# Patient Record
Sex: Female | Born: 1970 | Race: White | Hispanic: No | Smoking: Never smoker
Health system: Southern US, Community
[De-identification: ages and names within clinical notes are randomized; demographics above are authoritative.]

## PROBLEM LIST (undated history)

## (undated) DIAGNOSIS — F419 Anxiety disorder, unspecified: Secondary | ICD-10-CM

## (undated) DIAGNOSIS — T4145XA Adverse effect of unspecified anesthetic, initial encounter: Secondary | ICD-10-CM

## (undated) DIAGNOSIS — Z9889 Other specified postprocedural states: Secondary | ICD-10-CM

## (undated) DIAGNOSIS — R519 Headache, unspecified: Secondary | ICD-10-CM

## (undated) DIAGNOSIS — R112 Nausea with vomiting, unspecified: Secondary | ICD-10-CM

## (undated) DIAGNOSIS — T8859XA Other complications of anesthesia, initial encounter: Secondary | ICD-10-CM

## (undated) DIAGNOSIS — K219 Gastro-esophageal reflux disease without esophagitis: Secondary | ICD-10-CM

## (undated) DIAGNOSIS — M1990 Unspecified osteoarthritis, unspecified site: Secondary | ICD-10-CM

## (undated) DIAGNOSIS — R51 Headache: Secondary | ICD-10-CM

## (undated) DIAGNOSIS — R8290 Unspecified abnormal findings in urine: Secondary | ICD-10-CM

## (undated) HISTORY — DX: Anxiety disorder, unspecified: F41.9

## (undated) HISTORY — PX: ABDOMINAL HYSTERECTOMY: SHX81

## (undated) HISTORY — DX: Unspecified abnormal findings in urine: R82.90

## (undated) HISTORY — PX: BLADDER SURGERY: SHX569

## (undated) HISTORY — PX: CLOSED REDUCTION FINGER WITH PERCUTANEOUS PINNING: SHX5612

---

## 2000-12-03 ENCOUNTER — Encounter: Payer: Self-pay | Admitting: Family Medicine

## 2000-12-03 ENCOUNTER — Ambulatory Visit (HOSPITAL_COMMUNITY): Admission: RE | Admit: 2000-12-03 | Discharge: 2000-12-03 | Payer: Self-pay | Admitting: Family Medicine

## 2001-06-25 ENCOUNTER — Other Ambulatory Visit: Admission: RE | Admit: 2001-06-25 | Discharge: 2001-06-25 | Payer: Self-pay | Admitting: Obstetrics and Gynecology

## 2002-03-04 ENCOUNTER — Inpatient Hospital Stay (HOSPITAL_COMMUNITY): Admission: RE | Admit: 2002-03-04 | Discharge: 2002-03-06 | Payer: Self-pay | Admitting: Obstetrics & Gynecology

## 2002-05-22 ENCOUNTER — Ambulatory Visit (HOSPITAL_COMMUNITY): Admission: RE | Admit: 2002-05-22 | Discharge: 2002-05-22 | Payer: Self-pay | Admitting: Obstetrics & Gynecology

## 2003-01-04 ENCOUNTER — Emergency Department (HOSPITAL_COMMUNITY): Admission: EM | Admit: 2003-01-04 | Discharge: 2003-01-04 | Payer: Self-pay | Admitting: *Deleted

## 2007-06-18 ENCOUNTER — Ambulatory Visit (HOSPITAL_BASED_OUTPATIENT_CLINIC_OR_DEPARTMENT_OTHER): Admission: RE | Admit: 2007-06-18 | Discharge: 2007-06-18 | Payer: Self-pay | Admitting: Urology

## 2007-07-30 ENCOUNTER — Ambulatory Visit (HOSPITAL_BASED_OUTPATIENT_CLINIC_OR_DEPARTMENT_OTHER): Admission: RE | Admit: 2007-07-30 | Discharge: 2007-07-30 | Payer: Self-pay | Admitting: Urology

## 2007-10-31 ENCOUNTER — Emergency Department (HOSPITAL_COMMUNITY): Admission: EM | Admit: 2007-10-31 | Discharge: 2007-11-01 | Payer: Self-pay | Admitting: Emergency Medicine

## 2007-11-27 ENCOUNTER — Encounter: Admission: RE | Admit: 2007-11-27 | Discharge: 2007-11-27 | Payer: Self-pay | Admitting: General Surgery

## 2010-01-20 ENCOUNTER — Ambulatory Visit (HOSPITAL_COMMUNITY): Admission: RE | Admit: 2010-01-20 | Discharge: 2010-01-20 | Payer: Self-pay | Admitting: Obstetrics & Gynecology

## 2010-09-13 NOTE — Op Note (Signed)
Julie [name], Julie [name]             ACCOUNT NO.:  [id number]   MEDICAL RECORD NO.:  [id number]          PATIENT TYPE:  AMB   LOCATION:  [facility]                         FACILITY:  [facility]   PHYSICIAN:  Martina Sinner, MD DATE OF BIRTH:  [birth year]   DATE OF PROCEDURE:  06/18/2007  DATE OF DISCHARGE:                               OPERATIVE REPORT   PREOPERATIVE DIAGNOSIS:  Stress incontinence.   POSTOPERATIVE DIAGNOSIS:  Stress incontinence.   SURGERY:  Sling cystourethropexy [facility]) plus cystoscopy.   SURGEON:  Martina Sinner, MD   Ms. [name] has residual stress incontinence on Vesicare for her mixed  stress urge incontinence.  She consented to a sling.  She was taught  intermittent catheterization prior to surgery.   The patient was prepped and draped in usual fashion.  Extra care was  taken with leg positioning to minimize the risk of compartment syndrome  neuropathy and DVT.   I made two 1 cm incisions 1.5 cm lateral to the midline one  fingerbreadth just above the symphysis pubis.  I made a 2 cm incision  underlying the midurethra.  I made a thick incision so there is a nice  thick pubocervical flap to cover the sling.  I sharply dissected to  urethrovesical angle.  I instilled 7 mL of lidocaine epinephrine mixture  prior to making incision.   With the bladder empty I passed a SPARC needle on top of and along the  back of the symphysis pubis parallel to the midline on the pulp of my  index finger bilaterally.   I then cystoscoped the patient.  There is efflux of indigo carmine  bilaterally.  The bladder was not emptied and entered.  There is no  deflection of the bladder when I wiggled the trocars.  Urethra was  normal.   With the bladder empty, I attached the [facility] sling and brought it up  through the retropubic space.  I tensioned over the fat part of mid  sized Kelly clamp.  I cut below the blue dot to irrigate the sheath and  removed the sheaths.   I was very  happy with the position of the sling in the mid urethra.  There was appropriate hypermobility.  There is no spring back effect.   Irrigation of all incisions was utilized.  I closed the vaginal wall  incision with running 2-0 Vicryl on a CT wand followed by two  interrupted sutures.   I cut the sling below the level of the skin and closed the skin with 4-0  subcuticular as well as Dermabond.   Catheter was draining well at end of the case blue urine.  Vaginal pack  was inserted.  The patient taken to recovery room.           ______________________________  Martina Sinner, MD  Electronically Signed     SAM/MEDQ  D:  06/18/2007  T:  06/19/2007  Job:  [id number]

## 2010-09-13 NOTE — Op Note (Signed)
[name], [name]             ACCOUNT NO.:  [id number]   MEDICAL RECORD NO.:  [id number]          PATIENT TYPE:  AMB   LOCATION:  [facility]                         FACILITY:  [facility]   PHYSICIAN:  Martina Sinner, MD DATE OF BIRTH:  Jun 04, 1970   DATE OF PROCEDURE:  07/30/2007  DATE OF DISCHARGE:                               OPERATIVE REPORT   PREOPERATIVE DIAGNOSES:  Obstructive voiding post sling.   POSTOPERATIVE DIAGNOSES:  Obstructive voiding post sling.   PROCEDURE:  Urethrolysis plus cystoscopy.   SURGEON:  Martina Sinner, MD.   ASSISTANT:  Julie [name].   Julie [name] is 5-6 weeks from a sling.  She was emptying well and  was dry but had a slow trickling flow affecting her quality of life that  was not improved with time and with physical therapy.   The patient was prepped and draped in the usual fashion.  Extra care was  taken to minimize the risk of compartment syndrome, neuropathy and DVT  when I positioned her legs.  She was given preoperative antibiotics and  her laboratory tests were within normal limits drawn a few weeks ago.   I initially cystoscoped the patient.  The bladder and urethra were  normal.  I felt I could feel the sling in the middle aspect of the  incision.   I placed an Allis clamp near the urethral meatus and made a new incision  approximately 2-1/2 to 3 cm in length extending back near to the bladder  neck.  I developed a vaginal wall mucosal flap bilaterally with  Metzenbaum scissors.  I used the double ring retractor for exposure.   On two occasions, I placed a 21-French scope with its sheath to give me  a nice back pour that I could feel the sling with the tip of my finger  and fingernail.   I sharply dissected down with a 15 blade in the midline and found the  sling in the mid urethra.  I placed a very small right angle around it.  I incised it in the midline releasing the middle aspect of the urethra.  I sharply dissected the  sling after placing a tonsil on its free end  approximately a centimeter bilaterally.  I did not dissect all the way  to the endopelvic fascia. I trimmed approximately a centimeter of sling  bilaterally.   Not surprising, there was backbleeding at 5 and 7 o'clock near the  urethrovesical angle that was oversewn with a 3-0 Vicryl suture.  The  incision was irrigated.  I then trimmed the vaginal wall mucosa  minimally and closed it with running 2-0 Vicryl on a CT2 needle followed  by two interrupted sutures.   The Foley catheter was draining well at the end of the case and a  vaginal pack was inserted.  Hopefully this operation will reach Ms.  Rondel Baton treatment goal in that she will avoid better but still maintain  her continence.           ______________________________  Martina Sinner, MD  Electronically Signed     SAM/MEDQ  D:  07/30/2007  T:  07/30/2007  Job:  [id number]

## 2010-09-16 NOTE — Op Note (Signed)
NAME:  Julie [name], Julie [name]                       ACCOUNT NO.:  [id number]   MEDICAL RECORD NO.:  [id number]                   PATIENT TYPE:  INP   LOCATION:  A319                                 FACILITY:  [facility]   PHYSICIAN:  Lazaro Arms, M.D.                DATE OF BIRTH:  1970/06/08   DATE OF PROCEDURE:  03/04/2002  DATE OF DISCHARGE:                                 OPERATIVE REPORT   PREOPERATIVE DIAGNOSES:  1. Hypermenorrhea.  2. Dyspareunia.  3. Dysmenorrhea.  4. Right lower quadrant pain.   POSTOPERATIVE DIAGNOSES:  1. Hypermenorrhea.  2. Dyspareunia.  3. Dysmenorrhea.  4. Right lower quadrant pain.   PROCEDURE:  TVH and RSO.   SURGEON:  Lazaro Arms, M.D.   ANESTHESIA:  General endotracheal.   FINDINGS:  The patient had what appeared to be a normal uterus, somewhat  boggy.  The right ovary was tucked to the pelvic sidewall, but it was  otherwise normal.  The left ovary and tube were normal and free.   DESCRIPTION OF OPERATION:  The patient was taken to the operating room,  placed in a supine position, underwent general endotracheal anesthesia and  placed in dorsal lithotomy position and prepped and draped in the usual  sterile fashion.  A Foley catheter was placed.  A weighted speculum was  placed, cervix was grasped with thyroid tenaculums.  Marcaine 0.5% with  1:200,000 epinephrine was injected circumferentially about the cervix to  create a plane and for blood loss.  Circumferential incision was made with  electrocautery unit, and the vagina pushed off the lower uterine segment  both anteriorly and posteriorly.  The posterior cul-de-sac was entered  sharply.  The uterosacral ligaments were clamped, cut, transfixed, and  suture ligated and held.  The cardinal ligaments were clamped, cut,  transfixed, and suture ligated and held.  The anterior cul-de-sac was  entered.  The anterior and posterior leaves of the broad ligament were  plicated, and the uterine  vessels were clamped, cut, and suture ligated,  serial pedicles taken up the fundus, each pedicle being clamped, cut,  transfixed, and suture ligated and cut.  The uteroovarian ligaments were  cross-clamped and double suture ligated bilaterally.  Hemoclips were placed  for back-clamping.  There was a small bleeder off the uterine vessels, and  this was clamped with several hemoclips as well, and good hemostasis was  achieved.  The right infundibulopelvic ligament was then cross-clamped;  hemoclips were placed cephalad as a back clamp.  Double suture ligation was  performed.  The ovary and tube was removed.  All pedicles were found to be  hemostatic.  The anterior vagina, anterior peritoneum, posterior peritoneum,  and posterior vagina were closed in an interrupted fashion, side-to-side,  and the uterosacral ligaments were plicated.  There was  good hemostasis.  The pelvis had been irrigated and, again. all pedicles  found to be hemostatic.  The patient experienced about 200 cc of blood loss.  She was awakened from anesthesia and taken to the recovery room in good,  stable condition.  All counts were correct.                                                Lazaro Arms, M.D.    [name]  D:  03/06/2002  T:  03/06/2002  Job:  [id number]

## 2010-09-16 NOTE — H&P (Signed)
NAME:  Julie [name], Julie [name]                       ACCOUNT NO.:  [id number]   MEDICAL RECORD NO.:  [id number]                   PATIENT TYPE:  AMB   LOCATION:  DAY                                  FACILITY:  [facility]   PHYSICIAN:  Lazaro Arms, M.D.                DATE OF BIRTH:  02/20/1971   DATE OF ADMISSION:  DATE OF DISCHARGE:                                HISTORY & PHYSICAL   ADMISSION HISTORY & PHYSICAL   HISTORY OF PRESENT ILLNESS:  The patient is a 41 year old white female  gravida 2, para 2, status post tubal ligation in 1999 who bleeds five to six  days during the moth.  She soils her clothes, occasionally her bed sheets.  She uses tampons and pads together.  Her cramps are horrendous; a heating  pad or laying hot water relieves them somewhat.  She also has right-sided  pain bad enough that she wonders if she has an appendix problem or an  ectopic.  She states she has dyspareunia only about 10% of the time; it is  in the lower abdomen, only in the midline and it causes her to change  positions.   Examination reveals a very tender boggy uterus, but normal size, right ovary  that is tender and left adnexa, which is normal size and nontender.  As a  result she is admitted for a TVH and RSO.   PAST MEDICAL HISTORY:  1. History of migraines.  2. Kidney infections as a child.  3. Frequent UTIs.   PAST SURGICAL HISTORY:  1. She has tubes in her ears as a child as well as a tonsillectomy.  2. She had a breast biopsy in 1998.  3. Tubal ligation in 1999.   PAST OBSTETRICAL HISTORY:  Two vaginal deliveries; 1993 and 1999.   REVIEW OF SYSTEMS:  Otherwise negative.   HABITS:  The patient does not smoke.  She completed the 10th grade.  She  drinks alcohol socially.  No substance abuse.   PHYSICAL EXAMINATION:   VITAL SIGNS:  Her weight is __________  pounds.  Blood pressure is 120/70.   HEENT:  Unremarkable with normal thyroid.   LUNGS:  Clear.   HEART:  Regular rate and  rhythm without murmur, regurg or gallop.   BREASTS:  Without mass, discharge or skin changes.   ABDOMEN:  Benign without hepatosplenomegaly or mass.   GENITALIA:  She has normal external genitalia.  Vagina is pink and moist  without discharge.  Cervix parous without lesions.  Uterus boggy and tender  to palpation.  Right adnexa is normal size, but tender to palpation.  Left  adnexa is negative.   EXTREMITIES:  Warm with no edema.   NEUROLOGIC EXAMINATION:  Grossly intact.   MEDICATIONS:  Zoloft for premenstrual dysphoric disorder.   IMPRESSION:  1. Hypermenorrhea.  2. Dyspareunia minimally.  3. Dysmenorrhea.  4. Right lower quadrant pain.   PLAN:  The patient is admitted for TVH/RSO.  She understands the risks,  benefits, indications and alternatives, and will proceed.                                                 Lazaro Arms, M.D.    [name]  D:  03/03/2002  T:  03/03/2002  Job:  [id number]

## 2010-09-16 NOTE — Op Note (Signed)
NAME:  KISSA, CAMPOY                       ACCOUNT NO.:  [id number]   MEDICAL RECORD NO.:  [id number]                   PATIENT TYPE:  AMB   LOCATION:  DAY                                  FACILITY:  [facility]   PHYSICIAN:  Lazaro Arms, M.D.                DATE OF BIRTH:  November 28, 1970   DATE OF PROCEDURE:  DATE OF DISCHARGE:                                 OPERATIVE REPORT   PREOPERATIVE DIAGNOSES:  1. Left lower quadrant pain.  2. Left bump dyspareunia.  3. Left ovary adherent to left side of vaginal cuff apex.   POSTOPERATIVE DIAGNOSES:  1. Left lower quadrant pain.  2. Left bump dyspareunia.  3. Left ovary adherent to left side of vaginal cuff apex.   PROCEDURE:  Laparoscopic left salpingo-oophorectomy with lysis of adhesions  to the vaginal cuff.   SURGEON:  Lazaro Arms, M.D.   ANESTHESIA:  General endotracheal   FINDINGS:  The patient's left ovary and tube and omentum were adherent to  the apex of the vaginal cuff.  The left tube was obviously at the left  corner and it was sort of matted down with omentum and some epiploica fat.  There was no evidence of any active infection, and the patient had had an  unremarkable postoperative course from her __________ in November.   DESCRIPTION OF OPERATION:  The patient was taken to the operating room and  placed in the supine position, where she underwent general endotracheal  anesthesia.  She was then placed in a low lithotomy position and prepped and  draped in the usual sterile fashion.  A Foley catheter was placed.  An  incision was made in the umbilicus and a Veress needle was placed into the  peritoneal cavity without difficulty.  The peritoneal cavity was  insufflated. A 10-11 nonbladed obturator was then placed under direct  visualization to the peritoneal cavity without difficulty.  The peritoneal  cavity was confirmed with the video laparoscope.   An incision was made 2 fingerbreadths above the pubis and in the  right lower  quadrant lateral to the inferior epigastric vessels and a 5-mm ablated  trocar was placed in the peritoneal cavity without difficulty in both of  these sites under direct visualization.  Blunt dissection was performed.  The left round ligament was isolated and the fallopian tube and ovary were  identified.  The harmonic scalpel was used and I cauterized across the  infundibulopelvic ligament and the fallopian tube where the round ligament  goes into the pelvic side wall well away from the ureter.  This was done  without difficulty.   The EndoCatch bag was placed and the specimen was removed and sent to  pathology.  It appeared to be normal.  The omentum remaining in the pelvis  was teased away from the vaginal cuff bluntly.  It was kind of filmy  adhesions for the most part with  some density in the midline.  The pelvis  was irrigated vigorously numerous times and Interceed was placed on top of  the vaginal cuff to prevent adhesions from reforming in this area.  The  patient tolerated the procedure well.  She experienced 50 cc of blood loss.   The instruments were removed.  The gas was allowed to escape.  The fascia  and the umbilical incision was closed with 2-0 Vicryl sutures. A  subcuticular stitch was placed in the umbilical incision and a Dermabond was  placed in all three incision sites. The patient was awakened from anesthesia  and taken to recovery in good stable condition.  All counts were correct.                                               Lazaro Arms, M.D.    [name]  D:  05/22/2002  T:  05/22/2002  Job:  [id number]

## 2010-09-16 NOTE — H&P (Signed)
NAME:  Julie [name], AVIS                       ACCOUNT NO.:  [id number]   MEDICAL RECORD NO.:  [id number]                   PATIENT TYPE:  AMB   LOCATION:  DAY                                  FACILITY:  [facility]   PHYSICIAN:  Lazaro Arms, M.D.                DATE OF BIRTH:  30-Dec-1970   DATE OF ADMISSION:  05/22/2002  DATE OF DISCHARGE:                                HISTORY & PHYSICAL   HISTORY OF PRESENT ILLNESS:  The patient is a 40 year old white female,  gravida 2, para 2, who is status post a TVH and RSO on 03/04/02, which went  without difficulty.  That was performed because of right lower quadrant  pain, hypermenorrhea, dysmenorrhea, and dyspareunia.  She had a very  unremarkable postoperative course; however, since that time she has  developed a sharp left lower quadrant pain with intercourse, which occurs  every time and is quite tender.  She is unable to have intercourse because  of the pain.  Examination reveals and confirms the patient's symptoms and is  probably most consistent with an ovary that is adherent to the top of the  vaginal cuff.  The cuff is well-healed.  There is no evidence of cuff  cellulitis or infection, and there is no evidence of any sort of pelvic  infection going on.  She is now 2-1/2 months out from surgery and again had  no infectious complications postoperatively.  I talked to the patient and  the only real option is removal, and she agrees.  We will proceed with a  laparoscopic LSO because of blunt dyspareunia.   PAST MEDICAL HISTORY:  1. Migraines.  2. Kidney infections as a child and frequent urinary tract infections.   PAST SURGICAL HISTORY:  1. As above.  2. She had tubes put in her ears as a child as well as a tonsillectomy.  3. She had a breast biopsy in 1998.  4. Tubal ligation in 1999.   PAST OBSTETRICAL HISTORY:  Two vaginal delivers, 1993 and 1999.   REVIEW OF SYSTEMS:  As per HPI, otherwise negative.   SOCIAL HISTORY:  The  patient does not smoke.  She completed tenth grade.  She drinks alcohol socially.  No substance abuse.   PHYSICAL EXAMINATION:  VITAL SIGNS:  Her current weight is 178 pounds, blood  pressure is 100/60.  HEENT:  Unremarkable.  NECK:  Thyroid is normal.  CHEST:  Lungs are clear.  CARDIAC:  Heart is regular rate and rhythm without murmur, rub, or gallop.  BREASTS:  Negative.  ABDOMEN:  Benign.  PELVIC:  Benign except with the pain with palpation at the top of the cuff  on the left.  The rest of the pelvic exam is normal.  EXTREMITIES:  Warm, no edema.  NEUROLOGIC:  Grossly intact.   IMPRESSION:  1. Left lower quadrant pain and blunt dyspareunia, both on the left.  2. Status  post total vaginal hysterectomy, right salpingo-oophorectomy.   PLAN:  The patient is admitted for laparoscopic LSO.  She understands that  this will render her menopausal and she will have to undergo estrogen  replacement therapy to control hot flushes, emotional changes that will  occur.  She is aware of this and will proceed.  She understands the risks,  benefits, indications, and alternatives otherwise and again will proceed.                                                   Lazaro Arms, M.D.    [name]  D:  05/21/2002  T:  05/22/2002  Job:  [id number]

## 2010-09-16 NOTE — Discharge Summary (Signed)
   NAMETIFFANYE, Julie [name]                       ACCOUNT NO.:  [id number]   MEDICAL RECORD NO.:  [id number]                   PATIENT TYPE:  INP   LOCATION:  A319                                 FACILITY:  [facility]   PHYSICIAN:  Lazaro Arms, M.D.                DATE OF BIRTH:  1970-07-08   DATE OF ADMISSION:  03/04/2002  DATE OF DISCHARGE:  03/06/2002                                 DISCHARGE SUMMARY   DISCHARGE DIAGNOSES:  1. Status post a vaginal hysterectomy with right salpingo-oophorectomy.  2. Postoperative nausea and vomiting.   PROCEDURE:  Total vaginal hysterectomy/right salpingo-oophorectomy.   HISTORY AND PHYSICAL:  Please refer to the transcribed history and physical  and the operative note for details of admission to the hospital.   HOSPITAL COURSE:  The patient was admitted postoperatively.  The plan was  observation status but she had excessive nausea and vomiting, probably from  the anesthesia, and she was kept an extra day.  Her blood counts were  excellent at 11 and 34 postoperatively.  She tolerated clear liquids and a  regular diet, voided without symptoms, and was ambulatory.  Her physical  exam and abdominal exam were benign.  She was discharged to home on the  morning of postoperative day #2 in good and stable condition.  Follow up in  the office in one week for followup.  She was given instructions/precautions  for return prior to that time.  She was given Toradol and Tylox for pain and  Phenergan for nausea.                                                Lazaro Arms, M.D.    [name]  D:  03/06/2002  T:  03/07/2002  Job:  [id number]

## 2011-01-03 ENCOUNTER — Other Ambulatory Visit: Payer: Self-pay | Admitting: Obstetrics and Gynecology

## 2011-01-03 DIAGNOSIS — Z139 Encounter for screening, unspecified: Secondary | ICD-10-CM

## 2011-01-20 LAB — CBC
HCT: 39.2
Hemoglobin: 13.8
MCHC: 35.2
MCV: 90.1
Platelets: 288
RBC: 4.35
RDW: 13
WBC: 6.6

## 2011-01-20 LAB — PROTIME-INR: INR: 0.9

## 2011-01-20 LAB — APTT: aPTT: 27

## 2011-01-23 LAB — APTT: aPTT: 30

## 2011-01-23 LAB — CBC
HCT: 39.3
Hemoglobin: 13.9
MCHC: 35.4
RDW: 12.7

## 2011-01-24 ENCOUNTER — Ambulatory Visit (HOSPITAL_COMMUNITY)
Admission: RE | Admit: 2011-01-24 | Discharge: 2011-01-24 | Disposition: A | Discharge status: Home / Self Care | Payer: Managed Care, Other (non HMO) | Source: Ambulatory Visit | Attending: Obstetrics and Gynecology | Admitting: Obstetrics and Gynecology

## 2011-01-24 DIAGNOSIS — Z1231 Encounter for screening mammogram for malignant neoplasm of breast: Secondary | ICD-10-CM | POA: Insufficient documentation

## 2011-01-24 DIAGNOSIS — Z139 Encounter for screening, unspecified: Secondary | ICD-10-CM

## 2011-11-21 ENCOUNTER — Emergency Department (HOSPITAL_COMMUNITY)
Admission: EM | Admit: 2011-11-21 | Discharge: 2011-11-22 | Disposition: A | Discharge status: Home / Self Care | Payer: Managed Care, Other (non HMO) | Attending: Emergency Medicine | Admitting: Emergency Medicine

## 2011-11-21 DIAGNOSIS — K7689 Other specified diseases of liver: Secondary | ICD-10-CM

## 2011-11-21 DIAGNOSIS — N390 Urinary tract infection, site not specified: Secondary | ICD-10-CM | POA: Insufficient documentation

## 2011-11-21 DIAGNOSIS — R1011 Right upper quadrant pain: Secondary | ICD-10-CM | POA: Insufficient documentation

## 2011-11-21 DIAGNOSIS — R109 Unspecified abdominal pain: Secondary | ICD-10-CM

## 2011-11-21 LAB — CBC WITH DIFFERENTIAL/PLATELET
Eosinophils Relative: 1 % (ref 0–5)
Lymphocytes Relative: 35 % (ref 12–46)
Lymphs Abs: 3.6 10*3/uL (ref 0.7–4.0)
MCV: 91.6 fL (ref 78.0–100.0)
Neutro Abs: 5.8 10*3/uL (ref 1.7–7.7)
Platelets: 245 10*3/uL (ref 150–400)
RBC: 4.76 MIL/uL (ref 3.87–5.11)
WBC: 10.3 10*3/uL (ref 4.0–10.5)

## 2011-11-21 LAB — POCT PREGNANCY, URINE: Preg Test, Ur: NEGATIVE

## 2011-11-21 LAB — URINALYSIS, ROUTINE W REFLEX MICROSCOPIC
Bilirubin Urine: NEGATIVE
Hgb urine dipstick: NEGATIVE
Nitrite: NEGATIVE
Specific Gravity, Urine: 1.022 (ref 1.005–1.030)
pH: 7 (ref 5.0–8.0)

## 2011-11-21 LAB — COMPREHENSIVE METABOLIC PANEL
ALT: 26 U/L (ref 0–35)
Alkaline Phosphatase: 74 U/L (ref 39–117)
CO2: 27 mEq/L (ref 19–32)
Calcium: 9.2 mg/dL (ref 8.4–10.5)
Chloride: 103 mEq/L (ref 96–112)
GFR calc Af Amer: >90 mL/min (ref >90)
GFR calc non Af Amer: 83 mL/min — ABNORMAL LOW (ref >90)
Glucose, Bld: 101 mg/dL — ABNORMAL HIGH (ref 70–99)
Potassium: 3.9 mEq/L (ref 3.5–5.1)
Sodium: 142 mEq/L (ref 135–145)
Total Bilirubin: 0.3 mg/dL (ref 0.3–1.2)

## 2011-11-21 MED ORDER — SODIUM CHLORIDE 0.9 % IV SOLN
Freq: Once | INTRAVENOUS | Status: AC
  Administered 2011-11-21: 23:00:00 via INTRAVENOUS

## 2011-11-21 MED ORDER — ONDANSETRON HCL 4 MG/2ML IJ SOLN
4.0000 mg | Freq: Once | INTRAMUSCULAR | Status: AC
  Administered 2011-11-21: 4 mg via INTRAVENOUS
  Filled 2011-11-21: qty 2

## 2011-11-21 MED ORDER — MORPHINE SULFATE 4 MG/ML IJ SOLN
4.0000 mg | Freq: Once | INTRAMUSCULAR | Status: AC
  Administered 2011-11-21: 4 mg via INTRAVENOUS
  Filled 2011-11-21: qty 1

## 2011-11-21 NOTE — ED Notes (Signed)
Patient complaining of right flank pain radiating toward the umbilicus since yesterday evening.  Denies any urinary problems.  Denies any nausea, vomiting, or diarrhea.

## 2011-11-21 NOTE — ED Notes (Signed)
Pt reports R flank and rib area pain onset last PM states has occurred in the past but not as severe. Denies chest pain or SOB describes as stabbing recurrent pain

## 2011-11-22 ENCOUNTER — Encounter (HOSPITAL_COMMUNITY): Payer: Self-pay | Admitting: Radiology

## 2011-11-22 ENCOUNTER — Emergency Department (HOSPITAL_COMMUNITY): Payer: Managed Care, Other (non HMO)

## 2011-11-22 MED ORDER — IOHEXOL 300 MG/ML  SOLN
20.0000 mL | INTRAMUSCULAR | Status: AC
  Administered 2011-11-22: 20 mL via ORAL

## 2011-11-22 MED ORDER — CIPROFLOXACIN HCL 500 MG PO TABS: 500.0000 mg | ORAL_TABLET | Freq: Two times a day (BID) | ORAL | Status: AC

## 2011-11-22 MED ORDER — MORPHINE SULFATE 2 MG/ML IJ SOLN
2.0000 mg | Freq: Once | INTRAMUSCULAR | Status: AC
  Administered 2011-11-22: 2 mg via INTRAVENOUS
  Filled 2011-11-22: qty 1

## 2011-11-22 MED ORDER — HYDROMORPHONE HCL PF 1 MG/ML IJ SOLN
1.0000 mg | Freq: Once | INTRAMUSCULAR | Status: AC
  Administered 2011-11-22: 1 mg via INTRAVENOUS
  Filled 2011-11-22: qty 1

## 2011-11-22 MED ORDER — HYDROCODONE-ACETAMINOPHEN 5-500 MG PO TABS: 1.0000 | ORAL_TABLET | Freq: Four times a day (QID) | ORAL | Status: AC | PRN

## 2011-11-22 NOTE — ED Provider Notes (Signed)
History     CSN: [id number]  Arrival date & time 11/21/11  1940   First MD Initiated Contact with Patient 11/21/11 2231      Chief Complaint  Patient presents with  . Abdominal Pain    (Consider location/radiation/quality/duration/timing/severity/associated sxs/prior treatment) HPI Comments: Pt presents for sudden onset pain to the R side of the abdomen which began this morning. Reports pain was sharp and seemed primarily located to the RUQ but spontaneously resolved. Pt states pain returned a few hours ago and was stronger; it is now located to the periumbilical area with radiation to the RLQ. Pain described as sharp and stabbing. She denies nausea, vomiting, change in appetite or bowel movements. No vaginal bleeding or dc. Pain worsens with palpation; was worsened by car ride to ED.   Patient is a 41 y.o. female presenting with abdominal pain. The history is provided by the patient.  Abdominal Pain The primary symptoms of the illness include abdominal pain and nausea. The primary symptoms of the illness do not include fever, fatigue, vomiting, diarrhea, hematemesis, hematochezia, vaginal discharge or vaginal bleeding.     No past medical history on file.  No past surgical history on file.  No family history on file.  History  Substance Use Topics  . Smoking status: Not on file  . Smokeless tobacco: Not on file  . Alcohol Use: Not on file    OB History    No data available      Review of Systems  Constitutional: Negative for fever and fatigue.  Gastrointestinal: Positive for nausea and abdominal pain. Negative for vomiting, diarrhea, hematochezia and hematemesis.  Genitourinary: Negative for vaginal bleeding and vaginal discharge.  All other systems reviewed and are negative.    Allergies  Demerol  Home Medications   Current Outpatient Rx  Name Route Sig Dispense Refill  . ESTROPIPATE 1.5 MG PO TABS Oral Take 1.5 mg by mouth daily.      BP 119/65  Pulse 60   Temp 97.2 F (36.2 C) (Oral)  Resp 20  SpO2 99%  Physical Exam  Nursing note and vitals reviewed. Constitutional: She appears well-developed and well-nourished. No distress.  HENT:  Head: Normocephalic and atraumatic.  Eyes:       Normal appearance  Neck: Normal range of motion.  Cardiovascular: Normal rate, regular rhythm and normal heart sounds.   Pulmonary/Chest: Effort normal and breath sounds normal. She exhibits no tenderness.  Abdominal: Soft. Bowel sounds are normal.       Tender to palpation most focally just to R of umbilicus and to RLQ with guarding, no rebound. Negative Rovsing. Positive jar and obturator testing.  Musculoskeletal: Normal range of motion.  Neurological: She is alert.  Skin: Skin is warm and dry. She is not diaphoretic.  Psychiatric: She has a normal mood and affect.    ED Course  Procedures (including critical care time)  Labs Reviewed  URINALYSIS, ROUTINE W REFLEX MICROSCOPIC - Abnormal; Notable for the following:    APPearance CLOUDY (*)     Leukocytes, UA SMALL (*)     All other components within normal limits  CBC WITH DIFFERENTIAL - Abnormal; Notable for the following:    Hemoglobin 15.2 (*)     All other components within normal limits  COMPREHENSIVE METABOLIC PANEL - Abnormal; Notable for the following:    Glucose, Bld 101 (*)     GFR calc non Af Amer 83 (*)     All other components within normal limits  URINE MICROSCOPIC-ADD ON - Abnormal; Notable for the following:    Squamous Epithelial / LPF MANY (*)     Bacteria, UA MANY (*)     All other components within normal limits  POCT PREGNANCY, URINE  LIPASE, BLOOD   No results found.   No diagnosis found.    MDM  Pt presents with abd pain. Concern, based on exam, for acute abd process. High normal white count, no evidence of UTI on UA. Will proceed with imaging - pt awaiting study. Signed out to Dr. Verl Bangs, pending CT.       Grant Fontana, PA-C 11/22/11 [phone]

## 2011-11-22 NOTE — ED Notes (Signed)
Patient has finished drinking oral contrast. Kim in CT notified.

## 2011-11-22 NOTE — ED Provider Notes (Signed)
Medical screening examination/treatment/procedure(s) were performed by non-physician practitioner and as supervising physician I was immediately available for consultation/collaboration.  Katiejo Gilroy, MD 11/22/11 2354 

## 2011-11-22 NOTE — ED Provider Notes (Signed)
History     CSN: [id number]  Arrival date & time 11/21/11  1940   First MD Initiated Contact with Patient 11/21/11 2231      Chief Complaint  Patient presents with  . Abdominal Pain    (Consider location/radiation/quality/duration/timing/severity/associated sxs/prior treatment) HPI  History reviewed. No pertinent past medical history.  No past surgical history on file.  No family history on file.  History  Substance Use Topics  . Smoking status: Not on file  . Smokeless tobacco: Not on file  . Alcohol Use: Not on file    OB History    Grav Para Term Preterm Abortions TAB SAB Ect Mult Living                  Review of Systems  Allergies  Demerol  Home Medications   Current Outpatient Rx  Name Route Sig Dispense Refill  . ESTROPIPATE 1.5 MG PO TABS Oral Take 1.5 mg by mouth daily.      BP 119/65  Pulse 60  Temp 97.2 F (36.2 C) (Oral)  Resp 20  SpO2 99%  Physical Exam  ED Course  Procedures (including critical care time)  Labs Reviewed  URINALYSIS, ROUTINE W REFLEX MICROSCOPIC - Abnormal; Notable for the following:    APPearance CLOUDY (*)     Leukocytes, UA SMALL (*)     All other components within normal limits  CBC WITH DIFFERENTIAL - Abnormal; Notable for the following:    Hemoglobin 15.2 (*)     All other components within normal limits  COMPREHENSIVE METABOLIC PANEL - Abnormal; Notable for the following:    Glucose, Bld 101 (*)     GFR calc non Af Amer 83 (*)     All other components within normal limits  URINE MICROSCOPIC-ADD ON - Abnormal; Notable for the following:    Squamous Epithelial / LPF MANY (*)     Bacteria, UA MANY (*)     All other components within normal limits  POCT PREGNANCY, URINE  LIPASE, BLOOD   Ct Abdomen Pelvis W Contrast  11/22/2011  *RADIOLOGY REPORT*  Clinical Data: Right lower quadrant abdominal pain.  CT ABDOMEN AND PELVIS WITH CONTRAST  Technique:  Multidetector CT imaging of the abdomen and pelvis was  performed following the standard protocol during bolus administration of intravenous contrast.  Contrast:  100 ml Omnipaque-300  Comparison: 02/15/2007; 02/20/2007  Findings: Linear subsegmental atelectasis noted in the posterior basal segments of both lower lobes.  There is a suggestion of a diffusely enhancing/hypervascular 1.2 cm lesion in the dome of the lateral segment left hepatic lobe.  On image 20 of series 2, a 1.2 x 1.7 cm hyperdense lesion, potentially with a small tiny punctate central focus of hypodensity, is present in the right hepatic lobe.  Neither of these lesions were apparent on the prior exam.  Today's exam was performed prior to hepatic venous opacification which may be causing some heterogeneity of enhancement.  Probable mild focal fatty infiltration noted adjacent to the falciform ligament, similar to the prior exam.  The spleen, pancreas, adrenal glands, and gallbladder appear normal.  Scarring noted in the left kidney upper pole, stable.  No pathologic retroperitoneal or porta hepatis adenopathy is identified.  No pathologic pelvic adenopathy is identified.  The terminal ileum appears normal. I do not observe the appendix but no right lower quadrant inflammatory process is observed. Small left ovarian remnant is suspected.  The uterus is absent. The patient has had a prior bladder sling  procedure.  Urinary bladder appears unremarkable.  No free pelvic fluid observed.  No hernia noted.  IMPRESSION:  1.  A specific cause for the patient's right-sided and umbilical pain is not observed. 2.  Chronic scarring in the left kidney upper pole. 3.  There are two high density lesions in the liver, one in the dome of the lateral segment left hepatic lobe and one peripherally in the right hepatic lobe.  Etiology is uncertain.  These are not obvious on prior imaging, which is performed in a later phase of contrast administration.  Definitive way work these lesions up would be MRI of the abdomen with and  without contrast using hepatic protocol, and specific use of Eovist contrast medium would be helpful.  Differential diagnostic considerations may include focal nodular hyperplasia, hepatic adenomas, flash filling of hemangiomas, or hypervascular metastatic disease to the liver.  Original Report Authenticated By: Dellia Cloud, M.D.     No diagnosis found.    MDM  + abd pain.  ? Uti.  Ct with incidental hepatic lesion,  Will have fu with gastroenterology,  Analgesia,  Ret new/worsnening sxs        Mekenna Finau Lytle Michaels, MD 11/22/11 [phone]

## 2011-11-24 ENCOUNTER — Other Ambulatory Visit: Payer: Self-pay | Admitting: Gastroenterology

## 2011-11-24 DIAGNOSIS — K769 Liver disease, unspecified: Secondary | ICD-10-CM

## 2011-11-27 MED ORDER — IOHEXOL 300 MG/ML  SOLN: 100.0000 mL | Freq: Once | INTRAMUSCULAR | Status: AC | PRN

## 2011-12-01 ENCOUNTER — Ambulatory Visit
Admission: RE | Admit: 2011-12-01 | Discharge: 2011-12-01 | Disposition: A | Discharge status: Home / Self Care | Payer: Managed Care, Other (non HMO) | Source: Ambulatory Visit | Attending: Gastroenterology | Admitting: Gastroenterology

## 2011-12-01 DIAGNOSIS — K769 Liver disease, unspecified: Secondary | ICD-10-CM

## 2011-12-05 ENCOUNTER — Ambulatory Visit
Admission: RE | Admit: 2011-12-05 | Discharge: 2011-12-05 | Disposition: A | Discharge status: Home / Self Care | Payer: Managed Care, Other (non HMO) | Source: Ambulatory Visit | Attending: Gastroenterology | Admitting: Gastroenterology

## 2011-12-05 MED ORDER — GADOXETATE DISODIUM 0.25 MMOL/ML IV SOLN
8.0000 mL | Freq: Once | INTRAVENOUS | Status: AC | PRN
  Administered 2011-12-05: 8 mL via INTRAVENOUS

## 2012-01-19 ENCOUNTER — Other Ambulatory Visit: Payer: Self-pay | Admitting: Gastroenterology

## 2012-01-19 DIAGNOSIS — R1011 Right upper quadrant pain: Secondary | ICD-10-CM

## 2012-01-24 ENCOUNTER — Ambulatory Visit
Admission: RE | Admit: 2012-01-24 | Discharge: 2012-01-24 | Disposition: A | Discharge status: Home / Self Care | Payer: Managed Care, Other (non HMO) | Source: Ambulatory Visit | Attending: Gastroenterology | Admitting: Gastroenterology

## 2012-01-24 DIAGNOSIS — R1011 Right upper quadrant pain: Secondary | ICD-10-CM

## 2012-05-07 ENCOUNTER — Other Ambulatory Visit: Payer: Self-pay | Admitting: Obstetrics and Gynecology

## 2012-05-07 DIAGNOSIS — Z139 Encounter for screening, unspecified: Secondary | ICD-10-CM

## 2012-05-13 ENCOUNTER — Ambulatory Visit (HOSPITAL_COMMUNITY): Payer: Managed Care, Other (non HMO)

## 2012-05-14 ENCOUNTER — Ambulatory Visit (HOSPITAL_COMMUNITY)
Admission: RE | Admit: 2012-05-14 | Discharge: 2012-05-14 | Disposition: A | Discharge status: Home / Self Care | Payer: Managed Care, Other (non HMO) | Source: Ambulatory Visit | Attending: Obstetrics and Gynecology | Admitting: Obstetrics and Gynecology

## 2012-05-14 DIAGNOSIS — Z139 Encounter for screening, unspecified: Secondary | ICD-10-CM

## 2012-05-14 DIAGNOSIS — Z1231 Encounter for screening mammogram for malignant neoplasm of breast: Secondary | ICD-10-CM | POA: Insufficient documentation

## 2012-09-24 IMAGING — CT CT ABD-PELV W/ CM
2 of 5 series · 16 of 46 positions shown, 18 images · IV contrast (100ml omni 300)
Comparison: 02/15/2007; 02/20/2007

CLINICAL DATA: Right lower quadrant abdominal pain.

CT ABDOMEN AND PELVIS WITH CONTRAST
TECHNIQUE: Multidetector CT imaging of the abdomen and pelvis was
performed following the standard protocol during bolus
administration of intravenous contrast.
Contrast:  100 ml 7mnipaque-DZZ

[Series 2: routine abdomen · axial · 0.78mm/px · z∈[-470,-45]mm · 13 of 97 slices shown, 15 images]
[im 6/97  soft-tissue]
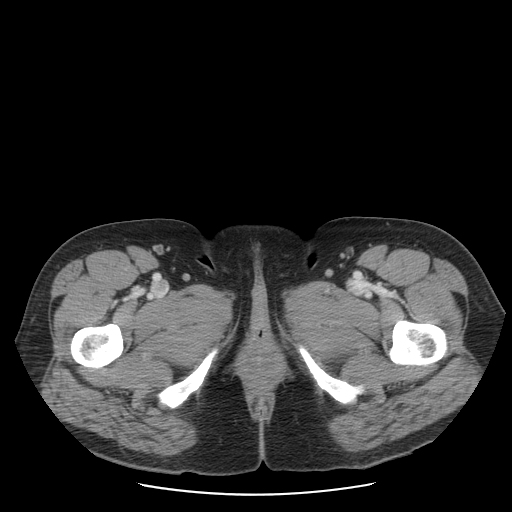
[im 6/97  bone]
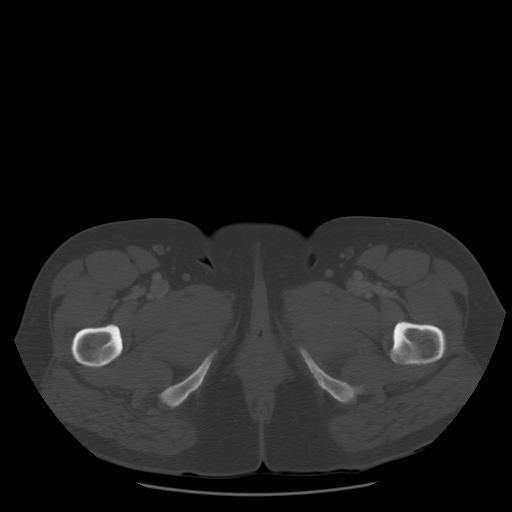
[im 16/97  soft-tissue]
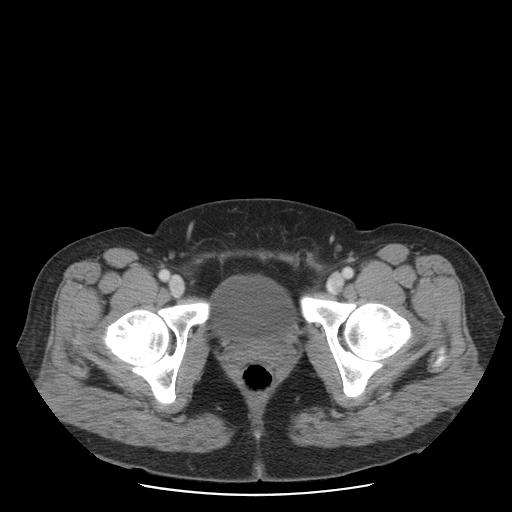
[im 21/97  soft-tissue]
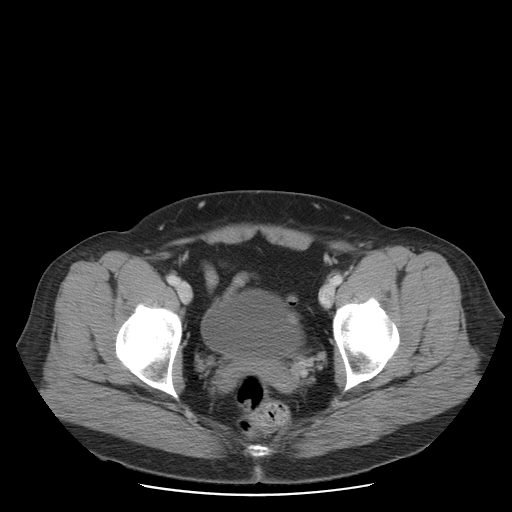
[im 26/97  soft-tissue]
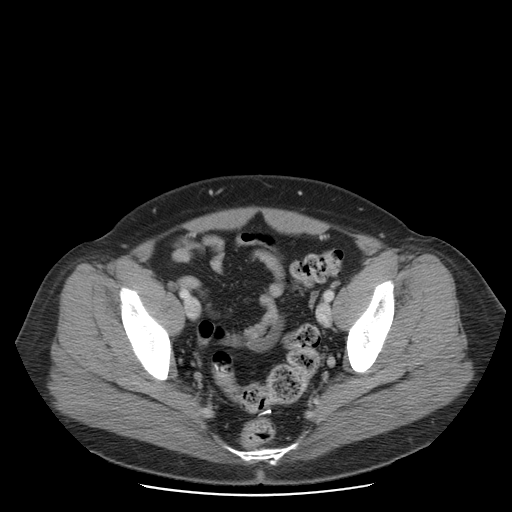
[im 36/97  soft-tissue]
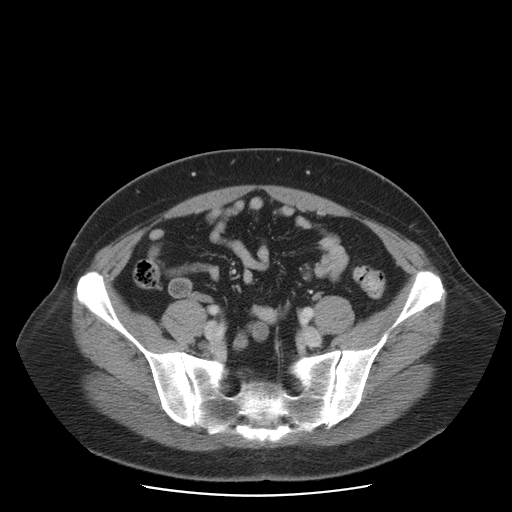
[im 41/97  soft-tissue]
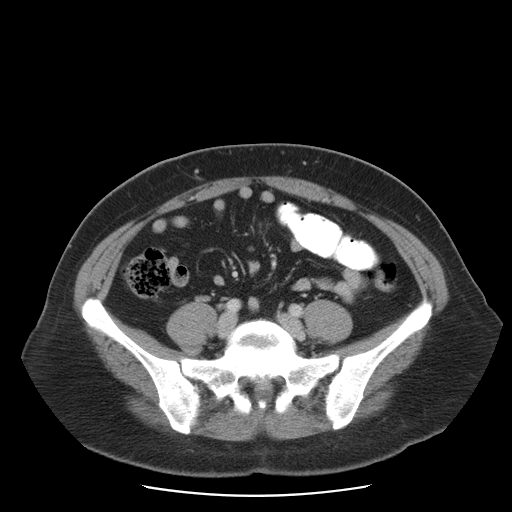
[im 51/97  soft-tissue]
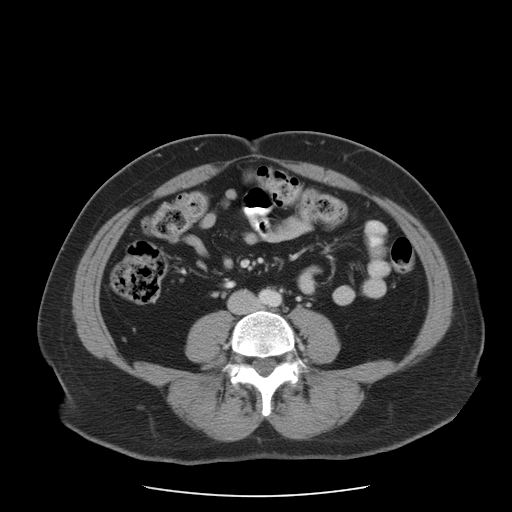
[im 56/97  soft-tissue]
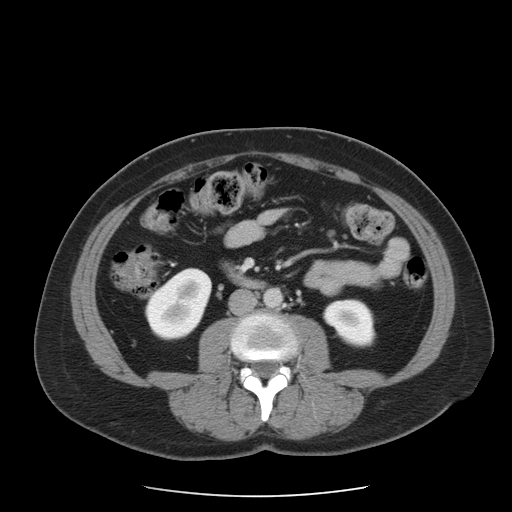
[im 61/97  soft-tissue]
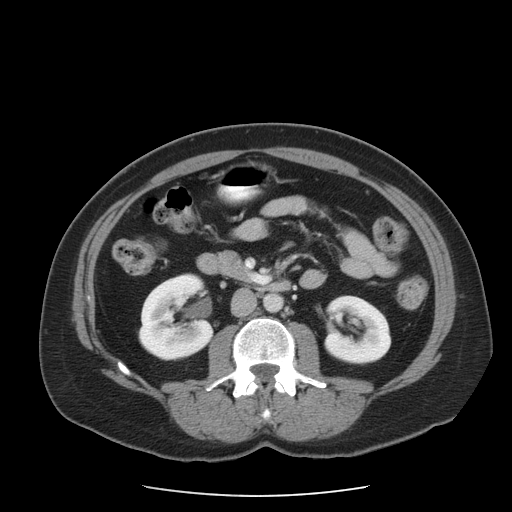
[im 61/97  bone]
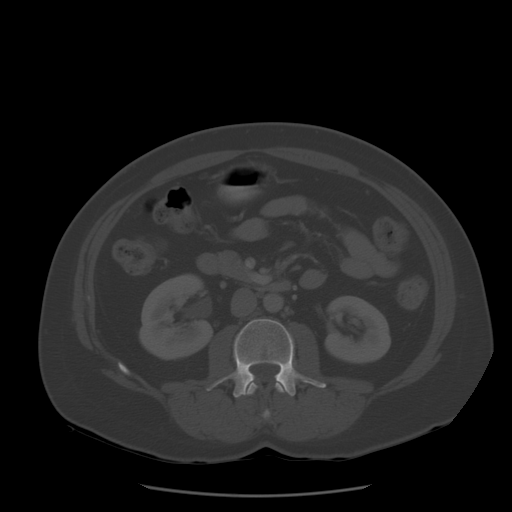
[im 71/97  soft-tissue]
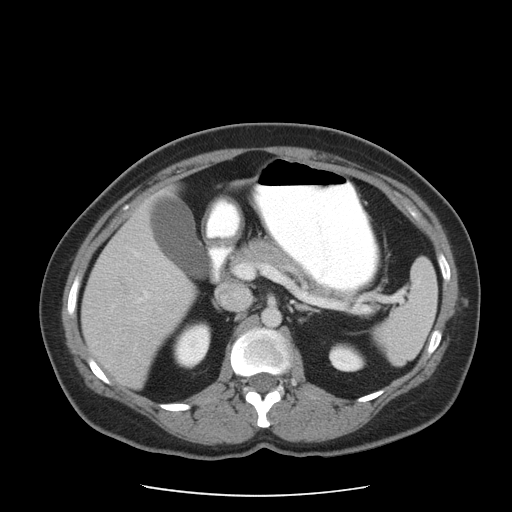
[im 76/97  soft-tissue]
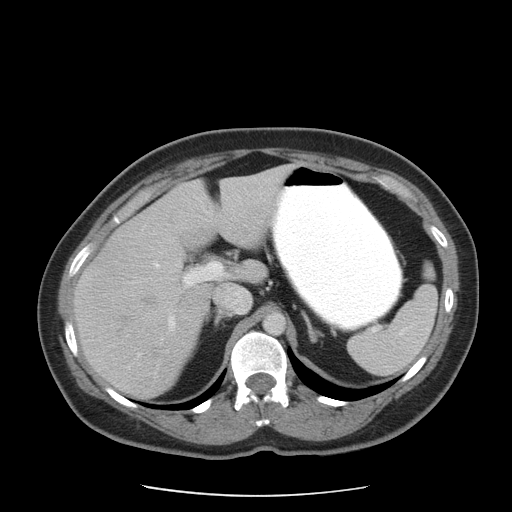
[im 81/97  soft-tissue]
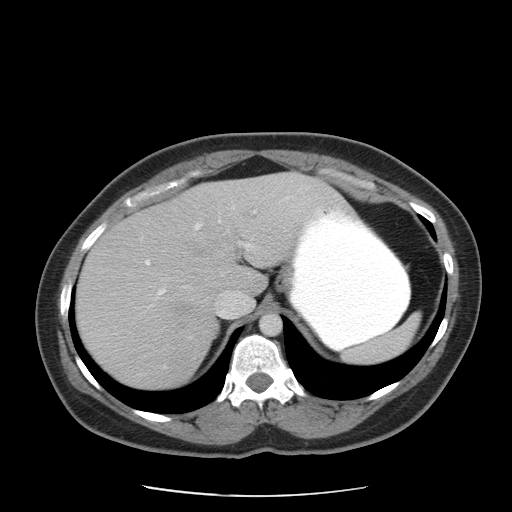
[im 91/97  soft-tissue]
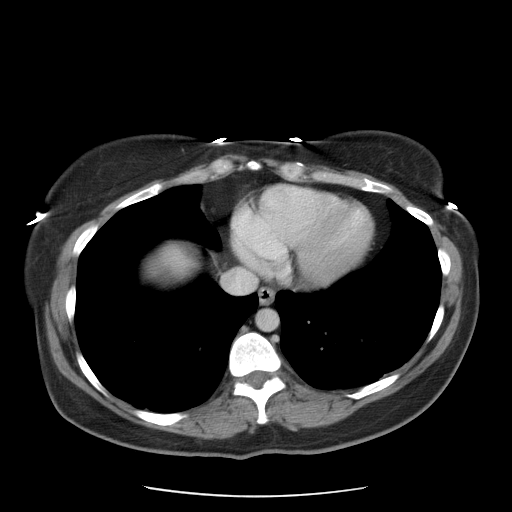

[Series 401: cor · coronal · 0.97mm/px · 3 of 98 slices shown]
[im 33/98  soft-tissue]
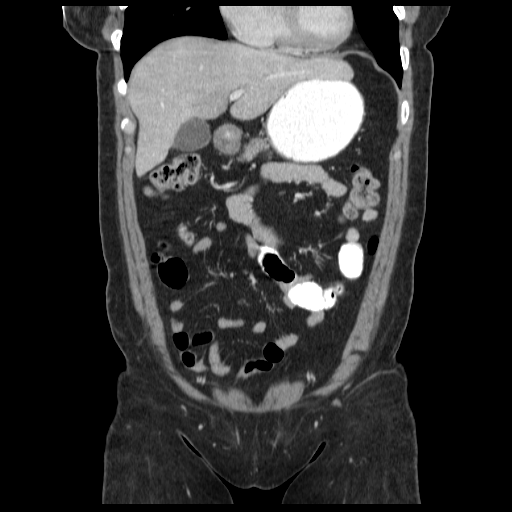
[im 44/98  soft-tissue]
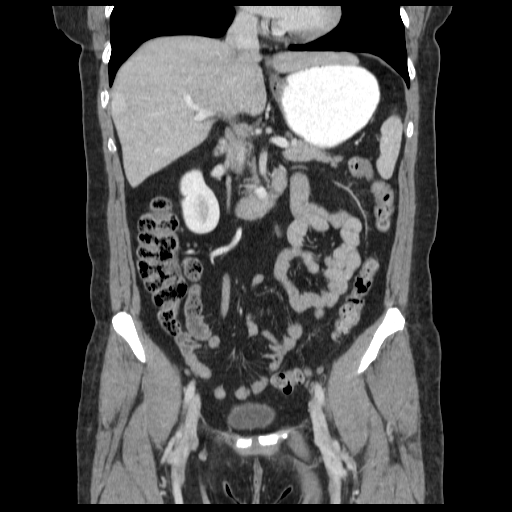
[im 54/98  soft-tissue]
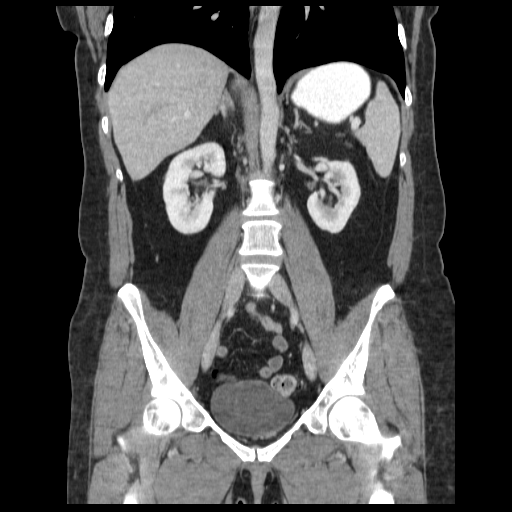

[16 of 46 positions shown; findings below may reference images not displayed]

FINDINGS: Linear subsegmental atelectasis noted in the posterior
basal segments of both lower lobes.

There is a suggestion of a diffusely enhancing/hypervascular 1.2 cm
lesion in the dome of the lateral segment left hepatic lobe.

On image 20 of series 2, a 1.2 x 1.7 cm hyperdense lesion,
potentially with a small tiny punctate central focus of
hypodensity, is present in the right hepatic lobe.  Neither of
these lesions were apparent on the prior exam.  Today's exam was
performed prior to hepatic venous opacification which may be
causing some heterogeneity of enhancement.  Probable mild focal
fatty infiltration noted adjacent to the falciform ligament,
similar to the prior exam.

The spleen, pancreas, adrenal glands, and gallbladder appear
normal.

Scarring noted in the left kidney upper pole, stable.

No pathologic retroperitoneal or porta hepatis adenopathy is
identified.

No pathologic pelvic adenopathy is identified.

The terminal ileum appears normal. I do not observe the appendix
but no right lower quadrant inflammatory process is observed.
Small left ovarian remnant is suspected.  The uterus is absent.
The patient has had a prior bladder sling procedure.  Urinary
bladder appears unremarkable.

No free pelvic fluid observed.  No hernia noted.
IMPRESSION: 1.  A specific cause for the patient's right-sided and umbilical
pain is not observed.
2.  Chronic scarring in the left kidney upper pole.
3.  There are two high density lesions in the liver, one in the
dome of the lateral segment left hepatic lobe and one peripherally
in the right hepatic lobe.  Etiology is uncertain.  These are not
obvious on prior imaging, which is performed in a later phase of
contrast administration.  Definitive way work these lesions up
would be MRI of the abdomen with and without contrast using hepatic
protocol, and specific use of Eovist contrast medium would be
helpful.  Differential diagnostic considerations may include focal
nodular hyperplasia, hepatic adenomas, flash filling of
hemangiomas, or hypervascular metastatic disease to the liver.

## 2012-11-07 ENCOUNTER — Other Ambulatory Visit: Payer: Self-pay | Admitting: *Deleted

## 2012-11-07 MED ORDER — LORAZEPAM 0.5 MG PO TABS: 0.5000 mg | ORAL_TABLET | Freq: Two times a day (BID) | ORAL | Status: DC

## 2013-02-13 ENCOUNTER — Other Ambulatory Visit: Payer: Self-pay | Admitting: Adult Health

## 2013-02-14 ENCOUNTER — Other Ambulatory Visit: Payer: Self-pay | Admitting: *Deleted

## 2013-02-17 ENCOUNTER — Telehealth: Payer: Self-pay | Admitting: *Deleted

## 2013-02-17 NOTE — Telephone Encounter (Signed)
Pt requesting refill on Ativan 0.5 mg, last filled 11/07/2012 by Cyril Mourning, NP. Pt states [facility] closed between 1-2 pm for lunch.

## 2013-02-19 NOTE — Telephone Encounter (Signed)
Pt states pharmacy has prescription.

## 2013-03-17 ENCOUNTER — Other Ambulatory Visit: Payer: Self-pay | Admitting: Adult Health

## 2013-09-17 ENCOUNTER — Encounter: Payer: Self-pay | Admitting: Adult Health

## 2013-09-17 ENCOUNTER — Ambulatory Visit (INDEPENDENT_AMBULATORY_CARE_PROVIDER_SITE_OTHER): Payer: BC Managed Care – PPO | Admitting: Adult Health

## 2013-09-17 VITALS — BP 128/70 | HR 74 | Ht 68.0 in | Wt 189.0 lb

## 2013-09-17 DIAGNOSIS — Z01419 Encounter for gynecological examination (general) (routine) without abnormal findings: Secondary | ICD-10-CM

## 2013-09-17 DIAGNOSIS — Z1212 Encounter for screening for malignant neoplasm of rectum: Secondary | ICD-10-CM

## 2013-09-17 DIAGNOSIS — F419 Anxiety disorder, unspecified: Secondary | ICD-10-CM | POA: Insufficient documentation

## 2013-09-17 LAB — CBC
HEMATOCRIT: 42.6 % (ref 36.0–46.0)
HEMOGLOBIN: 14.5 g/dL (ref 12.0–15.0)
MCH: 30.5 pg (ref 26.0–34.0)
MCHC: 34 g/dL (ref 30.0–36.0)
MCV: 89.5 fL (ref 78.0–100.0)
Platelets: 273 10*3/uL (ref 150–400)
RBC: 4.76 MIL/uL (ref 3.87–5.11)
RDW: 13.6 % (ref 11.5–15.5)
WBC: 7 10*3/uL (ref 4.0–10.5)

## 2013-09-17 LAB — HEMOCCULT GUIAC POC 1CARD (OFFICE): Fecal Occult Blood, POC: NEGATIVE

## 2013-09-17 LAB — LIPID PANEL
CHOLESTEROL: 190 mg/dL (ref 0–200)
HDL: 46 mg/dL (ref >39)
LDL CALC: 119 mg/dL — AB (ref 0–99)
TRIGLYCERIDES: 123 mg/dL (ref <150)
Total CHOL/HDL Ratio: 4.1 Ratio
VLDL: 25 mg/dL (ref 0–40)

## 2013-09-17 LAB — COMPREHENSIVE METABOLIC PANEL
ALBUMIN: 4.5 g/dL (ref 3.5–5.2)
ALK PHOS: 65 U/L (ref 39–117)
ALT: 35 U/L (ref 0–35)
AST: 23 U/L (ref 0–37)
BUN: 14 mg/dL (ref 6–23)
CALCIUM: 9.7 mg/dL (ref 8.4–10.5)
CHLORIDE: 102 meq/L (ref 96–112)
CO2: 30 meq/L (ref 19–32)
Creat: 0.65 mg/dL (ref 0.50–1.10)
GLUCOSE: 76 mg/dL (ref 70–99)
POTASSIUM: 4.6 meq/L (ref 3.5–5.3)
SODIUM: 141 meq/L (ref 135–145)
TOTAL PROTEIN: 7 g/dL (ref 6.0–8.3)
Total Bilirubin: 0.5 mg/dL (ref 0.2–1.2)

## 2013-09-17 LAB — TSH: TSH: 1.873 u[IU]/mL (ref 0.350–4.500)

## 2013-09-17 MED ORDER — LORAZEPAM 0.5 MG PO TABS: ORAL_TABLET | ORAL | Status: DC

## 2013-09-17 MED ORDER — BUSPIRONE HCL 5 MG PO TABS: 5.0000 mg | ORAL_TABLET | Freq: Three times a day (TID) | ORAL | Status: DC

## 2013-09-17 NOTE — Patient Instructions (Signed)
Generalized Anxiety Disorder Generalized anxiety disorder (GAD) is a mental disorder. It interferes with life functions, including relationships, work, and school. GAD is different from normal anxiety, which everyone experiences at some point in their lives in response to specific life events and activities. Normal anxiety actually helps us prepare for and get through these life events and activities. Normal anxiety goes away after the event or activity is over.  GAD causes anxiety that is not necessarily related to specific events or activities. It also causes excess anxiety in proportion to specific events or activities. The anxiety associated with GAD is also difficult to control. GAD can vary from mild to severe. People with severe GAD can have intense waves of anxiety with physical symptoms (panic attacks).  SYMPTOMS The anxiety and worry associated with GAD are difficult to control. This anxiety and worry are related to many life events and activities and also occur more days than not for 6 months or longer. People with GAD also have three or more of the following symptoms (one or more in children):  Restlessness.   Fatigue.  Difficulty concentrating.   Irritability.  Muscle tension.  Difficulty sleeping or unsatisfying sleep. DIAGNOSIS GAD is diagnosed through an assessment by your caregiver. Your caregiver will ask you questions aboutyour mood,physical symptoms, and events in your life. Your caregiver may ask you about your medical history and use of alcohol or drugs, including prescription medications. Your caregiver may also do a physical exam and blood tests. Certain medical conditions and the use of certain substances can cause symptoms similar to those associated with GAD. Your caregiver may refer you to a mental health specialist for further evaluation. TREATMENT The following therapies are usually used to treat GAD:   Medication Antidepressant medication usually is  prescribed for long-term daily control. Antianxiety medications may be added in severe cases, especially when panic attacks occur.   Talk therapy (psychotherapy) Certain types of talk therapy can be helpful in treating GAD by providing support, education, and guidance. A form of talk therapy called cognitive behavioral therapy can teach you healthy ways to think about and react to daily life events and activities.  Stress managementtechniques These include yoga, meditation, and exercise and can be very helpful when they are practiced regularly. A mental health specialist can help determine which treatment is best for you. Some people see improvement with one therapy. However, other people require a combination of therapies. Document Released: 08/12/2012 Document Reviewed: 08/12/2012 [facility] Patient Information 2014 [place], [place]. Try buspar Call for labs

## 2013-09-17 NOTE — Progress Notes (Signed)
Patient ID: Julie [name], female   DOB: 02/25/1971, 43 y.o.   MRN: [id number] History of Present Illness: Julie [name] is a 43 year old white female, married in for a physical.She is having anxiety and worries about things she can not change, and that affects her sleep.Has used ativan in past.   Current Medications, Allergies, Past Medical History, Past Surgical History, Family History and Social History [facility].     Review of Systems: Patient denies any headaches, blurred vision, shortness of breath, chest pain, abdominal pain, problems with bowel movements, urination, or intercourse. No joint pain, see HPI for positives.    Physical Exam:BP 128/70  Pulse 74  Ht 5\' 8"  (1.727 m)  Wt 189 lb (85.73 kg)  BMI 28.74 kg/m2 General:  Well developed, well nourished, no acute distress Skin:  Warm and dry Neck:  Midline trachea, normal thyroid Lungs; Clear to auscultation bilaterally Breast:  No dominant palpable mass, retraction, or nipple discharge Cardiovascular: Regular rate and rhythm Abdomen:  Soft, non tender, no hepatosplenomegaly Pelvic:  External genitalia is normal in appearance.  The vagina is normal in appearance. The cervix and uterus are absent.  No  adnexal masses or tenderness noted. Rectal: Good sphincter tone, no polyps, or hemorrhoids felt.  Hemoccult negative. Extremities:  No swelling or varicosities noted Psych: Alert and cooperative, seems happy Discussed trying meds daily, will try Buspar.  Impression: Yearly gyn exam no pap Anxiety     Plan: Physical in 1 year Mammogram yearly Check CBC,CMP,TSH and lipids Rx buspar 5 mg #60 1 bid with 6 refills  Rx ativan 0.5 mg #30 1 bid prn no refills Review handout on anxiety Will talk when labs back and again to see how Buspar working

## 2013-09-18 ENCOUNTER — Telehealth: Payer: Self-pay | Admitting: Adult Health

## 2013-09-18 NOTE — Telephone Encounter (Signed)
Left message labs looked good since non fasting will mail copy to pt.

## 2013-10-05 ENCOUNTER — Encounter (HOSPITAL_COMMUNITY): Payer: Self-pay | Admitting: Emergency Medicine

## 2013-10-05 ENCOUNTER — Emergency Department (HOSPITAL_COMMUNITY)
Admission: EM | Admit: 2013-10-05 | Discharge: 2013-10-05 | Disposition: A | Discharge status: Home / Self Care | Payer: Worker's Compensation | Attending: Emergency Medicine | Admitting: Emergency Medicine

## 2013-10-05 DIAGNOSIS — IMO0002 Reserved for concepts with insufficient information to code with codable children: Secondary | ICD-10-CM

## 2013-10-05 DIAGNOSIS — Y929 Unspecified place or not applicable: Secondary | ICD-10-CM | POA: Insufficient documentation

## 2013-10-05 DIAGNOSIS — Z23 Encounter for immunization: Secondary | ICD-10-CM | POA: Insufficient documentation

## 2013-10-05 DIAGNOSIS — W261XXA Contact with sword or dagger, initial encounter: Secondary | ICD-10-CM

## 2013-10-05 DIAGNOSIS — S61209A Unspecified open wound of unspecified finger without damage to nail, initial encounter: Secondary | ICD-10-CM | POA: Insufficient documentation

## 2013-10-05 DIAGNOSIS — W260XXA Contact with knife, initial encounter: Secondary | ICD-10-CM | POA: Insufficient documentation

## 2013-10-05 DIAGNOSIS — F411 Generalized anxiety disorder: Secondary | ICD-10-CM | POA: Insufficient documentation

## 2013-10-05 DIAGNOSIS — Y9389 Activity, other specified: Secondary | ICD-10-CM | POA: Insufficient documentation

## 2013-10-05 DIAGNOSIS — Z79899 Other long term (current) drug therapy: Secondary | ICD-10-CM | POA: Insufficient documentation

## 2013-10-05 MED ORDER — LIDOCAINE HCL (PF) 2 % IJ SOLN
2.0000 mL | Freq: Once | INTRAMUSCULAR | Status: AC
  Administered 2013-10-05: 2 mL
  Filled 2013-10-05: qty 10

## 2013-10-05 MED ORDER — TETANUS-DIPHTH-ACELL PERTUSSIS 5-2.5-18.5 LF-MCG/0.5 IM SUSP
0.5000 mL | Freq: Once | INTRAMUSCULAR | Status: AC
  Administered 2013-10-05: 0.5 mL via INTRAMUSCULAR
  Filled 2013-10-05: qty 0.5

## 2013-10-05 NOTE — ED Notes (Signed)
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 Bleeding controlled in triage.

## 2013-10-05 NOTE — ED Notes (Signed)
Pt with small lac to side of left index fingernail, bleeding controlled, unknown of last tetanus shot, pt was using a clean knife while cutting bread, pt stated supervisor knows pt is here and unsure of UDS needed, pt states she was not instructed to get one

## 2013-10-05 NOTE — Discharge Instructions (Signed)
Laceration Care, Adult °A laceration is a cut that goes through all layers of the skin. The cut goes into the tissue beneath the skin. °HOME CARE °For stitches (sutures) or staples: °· Keep the cut clean and dry. °· If you have a bandage (dressing), change it at least once a day. Change the bandage if it gets wet or dirty, or as told by your doctor. °· Wash the cut with soap and water 2 times a day. Rinse the cut with water. Pat it dry with a clean towel. °· Put a thin layer of medicated cream on the cut as told by your doctor. °· You may shower after the first 24 hours. Do not soak the cut in water until the stitches are removed. °· Only take medicines as told by your doctor. °· Have your stitches or staples removed as told by your doctor. °For skin adhesive strips: °· Keep the cut clean and dry. °· Do not get the strips wet. You may take a bath, but be careful to keep the cut dry. °· If the cut gets wet, pat it dry with a clean towel. °· The strips will fall off on their own. Do not remove the strips that are still stuck to the cut. °For wound glue: °· You may shower or take baths. Do not soak or scrub the cut. Do not swim. Avoid heavy sweating until the glue falls off on its own. After a shower or bath, pat the cut dry with a clean towel. °· Do not put medicine on your cut until the glue falls off. °· If you have a bandage, do not put tape over the glue. °· Avoid lots of sunlight or tanning lamps until the glue falls off. Put sunscreen on the cut for the first year to reduce your scar. °· The glue will fall off on its own. Do not pick at the glue. °You may need a tetanus shot if: °· You cannot remember when you had your last tetanus shot. °· You have never had a tetanus shot. °If you need a tetanus shot and you choose not to have one, you may get tetanus. Sickness from tetanus can be serious. °GET HELP RIGHT AWAY IF:  °· Your pain does not get better with medicine. °· Your arm, hand, leg, or foot loses feeling  (numbness) or changes color. °· Your cut is bleeding. °· Your joint feels weak, or you cannot use your joint. °· You have painful lumps on your body. °· Your cut is red, puffy (swollen), or painful. °· You have a red line on the skin near the cut. °· You have yellowish-white fluid (pus) coming from the cut. °· You have a fever. °· You have a bad smell coming from the cut or bandage. °· Your cut breaks open before or after stitches are removed. °· You notice something coming out of the cut, such as wood or glass. °· You cannot move a finger or toe. °MAKE SURE YOU:  °· Understand these instructions. °· Will watch your condition. °· Will get help right away if you are not doing well or get worse. °Document Released: 10/04/2007 Document Revised: 07/10/2011 Document Reviewed: 10/11/2010 °[employer]® Patient Information ©2014 [employer]. ° ° ° ° °

## 2013-10-05 NOTE — ED Provider Notes (Signed)
CSN: [id number]     Arrival date & time 10/05/13  1244 History  This chart was scribed for non-physician practitioner Burgess Amor, PA-C working with Laray Anger, DO by Danella Maiers, ED Scribe. This patient was seen in room APFT23/APFT23 and the patient's care was started at 2:50 PM.   Chief Complaint  Patient presents with  . Laceration   The history is provided by the patient. No language interpreter was used.   HPI Comments: Julie [name] is a 43 y.o. 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 She states the knife was new and very sharp. She has not taken anything for the pain. Bleeding is controlled. She has no chronic medical problems. She does not know the date of her last TDAP.   Past Medical History  Diagnosis Date  . Anxiety    Past Surgical History  Procedure Laterality Date  . Abdominal hysterectomy    . Bladder surgery     Family History  Problem Relation Age of Onset  . Adopted: Yes   History  Substance Use Topics  . Smoking status: Never Smoker   . Smokeless tobacco: Never Used  . Alcohol Use: Yes     Comment: occassionally   OB History   Grav Para Term Preterm Abortions TAB SAB Ect Mult Living   2 2        2      Review of Systems  Constitutional: Negative for fever.  Gastrointestinal: Negative for vomiting.  Skin: Positive for wound.  Neurological: Negative for weakness and numbness.  All other systems reviewed and are negative.     Allergies  Demerol  Home Medications   Prior to Admission medications   Medication Sig Start Date End Date Taking? Authorizing Provider  busPIRone (BUSPAR) 5 MG tablet Take 1 tablet (5 mg total) by mouth 3 (three) times daily. 09/17/13  Yes Adline Potter, NP  LORazepam (ATIVAN) 0.5 MG tablet TAKE ONE TABLET BY MOUTH TWICE DAILY AS NEEDED  09/17/13  Yes Adline Potter, NP   BP 131/78  Pulse 72  Temp(Src) 98.5 F (36.9 C) (Oral)  Resp 16  SpO2 99% Physical Exam  Constitutional: She is oriented to person, place, and time. She appears well-developed and well-nourished.  HENT:  Head: Normocephalic.  Cardiovascular: Normal rate.   Pulmonary/Chest: Effort normal.  Musculoskeletal: She exhibits tenderness.  Neurological: She is alert and oriented to person, place, and time. No sensory deficit.  Skin: Laceration noted.  Left index finger - less than 3 sec cap refill.  Small 1/2 cm lac that starts at the  Finger tip and runs along the edge of her cuticle. Hemostatic and well approximated.     ED Course  Procedures (including critical care time) Medications  Tdap (BOOSTRIX) injection 0.5 mL (0.5 mLs Intramuscular Given 10/05/13 1506)  lidocaine (XYLOCAINE) 2 % injection 2 mL (2 mLs Other Given by Other 10/05/13 1612)     LACERATION REPAIR Performed by: Burgess Amor Authorized by: Burgess Amor Consent: Verbal consent obtained. Risks and benefits: risks, benefits and alternatives were discussed Consent given by: patient Patient identity confirmed: provided demographic data Prepped and Draped in normal sterile fashion Wound explored  Laceration Location: left index finger  Laceration Length: 0.5 cm  No Foreign Bodies seen or palpated  Anesthesia: digital block  Local anesthetic: lidocaine 2%  without epinephrine  Anesthetic total: 1.5 ml  Irrigation method: syringe Amount of cleaning: standard  Skin closure: dermabond  Number of sutures: dermabond  Technique: dermabond  Patient tolerance: Patient tolerated the procedure well with no immediate complications.  DIAGNOSTIC STUDIES: Oxygen Saturation is 99% on RA, normal by my interpretation.    COORDINATION OF CARE: 2:52 PM- Discussed treatment plan with pt which includes dermabond. Pt agrees to plan.    Labs Review Labs Reviewed - No data to  display  Imaging Review No results found.   EKG Interpretation None      MDM   Final diagnoses:  Laceration    dermabond repair.  Tetanus updated.  Prn f/u anticipated.  I personally performed the services described in this documentation, which was scribed in my presence. The recorded information has been reviewed and is accurate.   Burgess AmorJulie Shequita Peplinski, PA-C 10/06/13 2120

## 2013-10-07 NOTE — ED Provider Notes (Signed)
Medical screening examination/treatment/procedure(s) were performed by non-physician practitioner and as supervising physician I was immediately available for consultation/collaboration.   EKG Interpretation None        Laray Anger, DO 10/07/13 1145

## 2013-12-31 ENCOUNTER — Other Ambulatory Visit: Payer: Self-pay | Admitting: Adult Health

## 2014-02-16 ENCOUNTER — Other Ambulatory Visit: Payer: Self-pay | Admitting: Adult Health

## 2014-03-02 ENCOUNTER — Encounter (HOSPITAL_COMMUNITY): Payer: Self-pay | Admitting: Emergency Medicine

## 2014-04-13 ENCOUNTER — Other Ambulatory Visit: Payer: Self-pay | Admitting: Adult Health

## 2014-04-20 ENCOUNTER — Emergency Department (HOSPITAL_COMMUNITY)
Admission: EM | Admit: 2014-04-20 | Discharge: 2014-04-20 | Disposition: A | Discharge status: Home / Self Care | Payer: 59 | Attending: Emergency Medicine | Admitting: Emergency Medicine

## 2014-04-20 ENCOUNTER — Encounter (HOSPITAL_COMMUNITY): Payer: Self-pay | Admitting: *Deleted

## 2014-04-20 ENCOUNTER — Emergency Department (HOSPITAL_COMMUNITY): Payer: 59

## 2014-04-20 DIAGNOSIS — R109 Unspecified abdominal pain: Secondary | ICD-10-CM | POA: Diagnosis present

## 2014-04-20 DIAGNOSIS — N12 Tubulo-interstitial nephritis, not specified as acute or chronic: Secondary | ICD-10-CM | POA: Diagnosis not present

## 2014-04-20 DIAGNOSIS — Z90710 Acquired absence of both cervix and uterus: Secondary | ICD-10-CM | POA: Insufficient documentation

## 2014-04-20 DIAGNOSIS — Z79899 Other long term (current) drug therapy: Secondary | ICD-10-CM | POA: Insufficient documentation

## 2014-04-20 DIAGNOSIS — F419 Anxiety disorder, unspecified: Secondary | ICD-10-CM | POA: Insufficient documentation

## 2014-04-20 LAB — URINALYSIS, ROUTINE W REFLEX MICROSCOPIC
BILIRUBIN URINE: NEGATIVE
Glucose, UA: NEGATIVE mg/dL
Hgb urine dipstick: NEGATIVE
KETONES UR: 15 mg/dL — AB
NITRITE: NEGATIVE
PROTEIN: NEGATIVE mg/dL
Specific Gravity, Urine: 1.024 (ref 1.005–1.030)
UROBILINOGEN UA: 0.2 mg/dL (ref 0.0–1.0)
pH: 5 (ref 5.0–8.0)

## 2014-04-20 LAB — COMPREHENSIVE METABOLIC PANEL
ALK PHOS: 77 U/L (ref 39–117)
ALT: 21 U/L (ref 0–35)
ANION GAP: 13 (ref 5–15)
AST: 15 U/L (ref 0–37)
Albumin: 4.5 g/dL (ref 3.5–5.2)
BILIRUBIN TOTAL: 0.4 mg/dL (ref 0.3–1.2)
BUN: 11 mg/dL (ref 6–23)
CHLORIDE: 102 meq/L (ref 96–112)
CO2: 28 mEq/L (ref 19–32)
CREATININE: 0.73 mg/dL (ref 0.50–1.10)
Calcium: 9.6 mg/dL (ref 8.4–10.5)
GLUCOSE: 97 mg/dL (ref 70–99)
POTASSIUM: 3.9 meq/L (ref 3.7–5.3)
Sodium: 143 mEq/L (ref 137–147)
Total Protein: 7.4 g/dL (ref 6.0–8.3)

## 2014-04-20 LAB — CBC WITH DIFFERENTIAL/PLATELET
Basophils Absolute: 0 10*3/uL (ref 0.0–0.1)
Basophils Relative: 1 % (ref 0–1)
Eosinophils Absolute: 0.1 10*3/uL (ref 0.0–0.7)
Eosinophils Relative: 1 % (ref 0–5)
HEMATOCRIT: 40.7 % (ref 36.0–46.0)
HEMOGLOBIN: 13.9 g/dL (ref 12.0–15.0)
LYMPHS ABS: 2.2 10*3/uL (ref 0.7–4.0)
Lymphocytes Relative: 32 % (ref 12–46)
MCH: 31 pg (ref 26.0–34.0)
MCHC: 34.2 g/dL (ref 30.0–36.0)
MCV: 90.6 fL (ref 78.0–100.0)
MONO ABS: 0.4 10*3/uL (ref 0.1–1.0)
MONOS PCT: 6 % (ref 3–12)
NEUTROS ABS: 4.1 10*3/uL (ref 1.7–7.7)
NEUTROS PCT: 60 % (ref 43–77)
Platelets: 240 10*3/uL (ref 150–400)
RBC: 4.49 MIL/uL (ref 3.87–5.11)
RDW: 12.9 % (ref 11.5–15.5)
WBC: 6.8 10*3/uL (ref 4.0–10.5)

## 2014-04-20 LAB — URINE MICROSCOPIC-ADD ON

## 2014-04-20 MED ORDER — DEXTROSE 5 % IV SOLN
1.0000 g | Freq: Once | INTRAVENOUS | Status: AC
  Administered 2014-04-20: 1 g via INTRAVENOUS
  Filled 2014-04-20: qty 10

## 2014-04-20 MED ORDER — ONDANSETRON HCL 4 MG/2ML IJ SOLN
4.0000 mg | Freq: Once | INTRAMUSCULAR | Status: AC
  Administered 2014-04-20: 4 mg via INTRAVENOUS
  Filled 2014-04-20: qty 2

## 2014-04-20 MED ORDER — ONDANSETRON 4 MG PO TBDP: 4.0000 mg | ORAL_TABLET | Freq: Three times a day (TID) | ORAL | Status: DC | PRN

## 2014-04-20 MED ORDER — MORPHINE SULFATE 4 MG/ML IJ SOLN
4.0000 mg | INTRAMUSCULAR | Status: DC | PRN
  Administered 2014-04-20 (×2): 4 mg via INTRAVENOUS
  Filled 2014-04-20 (×2): qty 1

## 2014-04-20 MED ORDER — HYDROCODONE-ACETAMINOPHEN 5-325 MG PO TABS: 2.0000 | ORAL_TABLET | ORAL | Status: DC | PRN

## 2014-04-20 MED ORDER — CIPROFLOXACIN HCL 500 MG PO TABS: 500.0000 mg | ORAL_TABLET | Freq: Two times a day (BID) | ORAL | Status: DC

## 2014-04-20 NOTE — ED Provider Notes (Signed)
CSN: [id number]     Arrival date & time 04/20/14  1820 History   First MD Initiated Contact with Patient 04/20/14 2018     Chief Complaint  Patient presents with  . Abdominal Pain      HPI  Presents evaluation of intermittent left flank pain. States it is been present for approximately week. Has episodes each day but not consistently. States it will worsen or somewhat crescendo at times from her left flank to her left lower abdomen. N will seem normal skull away. No associated fever. Poor appetite and nausea but no vomiting. Normal heart habits. No hematuria or frank dysuria. Has had cystitis in the past is not having any cystitis symptoms. Uncertain she's never had a kidney infection. No history of kidney stone. CT scan and MRI of the abdomen obtained to further delineate some ultimately benign lesions in the liver. No demonstrated diverticulitis that time.  Past Medical History  Diagnosis Date  . Anxiety    Past Surgical History  Procedure Laterality Date  . Abdominal hysterectomy    . Bladder surgery     Family History  Problem Relation Age of Onset  . Adopted: Yes   History  Substance Use Topics  . Smoking status: Never Smoker   . Smokeless tobacco: Never Used  . Alcohol Use: Yes     Comment: occassionally   OB History    Gravida Para Term Preterm AB TAB SAB Ectopic Multiple Living   2 2        2      Review of Systems  Constitutional: Negative for fever, chills, diaphoresis, appetite change and fatigue.  HENT: Negative for mouth sores, sore throat and trouble swallowing.   Eyes: Negative for visual disturbance.  Respiratory: Negative for cough, chest tightness, shortness of breath and wheezing.   Cardiovascular: Negative for chest pain.  Gastrointestinal: Positive for nausea and abdominal pain. Negative for vomiting, diarrhea and abdominal distention.  Endocrine: Negative for polydipsia, polyphagia and polyuria.  Genitourinary: Positive for flank pain. Negative for  dysuria, frequency and hematuria.  Musculoskeletal: Negative for gait problem.  Skin: Negative for color change, pallor and rash.  Neurological: Negative for dizziness, syncope, light-headedness and headaches.  Hematological: Does not bruise/bleed easily.  Psychiatric/Behavioral: Negative for behavioral problems and confusion.      Allergies  Demerol  Home Medications   Prior to Admission medications   Medication Sig Start Date End Date Taking? Authorizing Provider  busPIRone (BUSPAR) 5 MG tablet Take 5 mg by mouth 2 (two) times daily.   Yes Historical Provider, MD  LORazepam (ATIVAN) 0.5 MG tablet Take 0.5 mg by mouth 2 (two) times daily as needed for anxiety.   Yes Historical Provider, MD  busPIRone (BUSPAR) 5 MG tablet TAKE 1 TABLET (5 MG TOTAL) BY MOUTH 3 (THREE) TIMES DAILY. 04/14/14   Adline PotterJennifer A Griffin, NP  ciprofloxacin (CIPRO) 500 MG tablet Take 1 tablet (500 mg total) by mouth every 12 (twelve) hours. 04/20/14   Rolland PorterMark Arriyana Rodell, MD  HYDROcodone-acetaminophen (NORCO/VICODIN) 5-325 MG per tablet Take 2 tablets by mouth every 4 (four) hours as needed. 04/20/14   Rolland PorterMark Erionna Strum, MD  LORazepam (ATIVAN) 0.5 MG tablet TAKE 1 TABLET BY MOUTH TWICE A DAY AS NEEDED 02/16/14   Adline PotterJennifer A Griffin, NP  ondansetron (ZOFRAN ODT) 4 MG disintegrating tablet Take 1 tablet (4 mg total) by mouth every 8 (eight) hours as needed for nausea. 04/20/14   Rolland PorterMark Abigail Marsiglia, MD   BP 131/69 mmHg  Pulse 70  Temp(Src) 97.9 F (36.6 C)  Resp 11  SpO2 98% Physical Exam  Constitutional: She is oriented to person, place, and time. She appears well-developed and well-nourished. No distress.  HENT:  Head: Normocephalic.  Eyes: Conjunctivae are normal. Pupils are equal, round, and reactive to light. No scleral icterus.  Neck: Normal range of motion. Neck supple. No thyromegaly present.  Cardiovascular: Normal rate and regular rhythm.  Exam reveals no gallop and no friction rub.   No murmur heard. Pulmonary/Chest: Effort  normal and breath sounds normal. No respiratory distress. She has no wheezes. She has no rales.  Abdominal: Soft. Bowel sounds are normal. She exhibits no distension. There is no tenderness. There is no rebound.    Musculoskeletal: Normal range of motion.       Arms: Neurological: She is alert and oriented to person, place, and time.  Skin: Skin is warm and dry. No rash noted.  Psychiatric: She has a normal mood and affect. Her behavior is normal.    ED Course  Procedures (including critical care time) Labs Review Labs Reviewed  URINALYSIS, ROUTINE W REFLEX MICROSCOPIC - Abnormal; Notable for the following:    APPearance CLOUDY (*)    Ketones, ur 15 (*)    Leukocytes, UA SMALL (*)    All other components within normal limits  URINE MICROSCOPIC-ADD ON - Abnormal; Notable for the following:    Squamous Epithelial / LPF FEW (*)    Bacteria, UA FEW (*)    Crystals CA OXALATE CRYSTALS (*)    All other components within normal limits  CBC WITH DIFFERENTIAL  COMPREHENSIVE METABOLIC PANEL    Imaging Review Ct Abdomen Pelvis Wo Contrast  04/20/2014   CLINICAL DATA:  Acute onset of left lower quadrant abdominal pain for 1 week, radiating to the mid abdomen. Mild nausea. Initial encounter.  EXAM: CT ABDOMEN AND PELVIS WITHOUT CONTRAST  TECHNIQUE: Multidetector CT imaging of the abdomen and pelvis was performed following the standard protocol without IV contrast.  COMPARISON:  CT of the abdomen and pelvis from 11/22/2011, and abdominal ultrasound performed 01/24/2012  FINDINGS: The visualized lung bases are clear.  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 The liver and spleen are unremarkable in appearance. The gallbladder is within normal limits. The pancreas and adrenal glands are unremarkable.  The kidneys are unremarkable in appearance. There is no evidence of hydronephrosis. No renal or  ureteral stones are seen. No perinephric stranding is appreciated.  No free fluid is identified. The small bowel is unremarkable in appearance. The stomach is within normal limits. No acute vascular abnormalities are seen.  The appendix is not definitely seen; there is no evidence for appendicitis. The colon is partially filled with stool and is unremarkable in appearance.  The bladder is mildly distended and grossly unremarkable. The patient is status post hysterectomy. No suspicious adnexal masses are seen. No inguinal lymphadenopathy is seen.  No acute osseous abnormalities are identified.  IMPRESSION: No acute abnormality seen within the abdomen or pelvis.   Electronically Signed   By: Roanna RaiderJeffery  Chang M.D.   On: 04/20/2014 22:36     EKG Interpretation None      MDM   Final diagnoses:  Flank pain  Pyelonephritis    Urine appears infected. However does show calcium oxalate crystals. CT scan obtained to rule out obstruction or stone in addition to infection. This shows no active uropathy. No other acute conditions noted.  Patient given IV Rocephin. Plan be discharged home. Rest, fluid hydration, 5 through when necessary, Zofran when necessary, ten-day course Cipro. Primary care follow-up.    Rolland Porter, MD 04/20/14 [id number]

## 2014-04-20 NOTE — Discharge Instructions (Signed)
Pyelonephritis, Adult °Pyelonephritis is a kidney infection. A kidney infection can happen quickly, or it can last for a long time. °HOME CARE  °· Take your medicine (antibiotics) as told. Finish it even if you start to feel better. °· Keep all doctor visits as told. °· Drink enough fluids to keep your pee (urine) clear or pale yellow. °· Only take medicine as told by your doctor. °GET HELP RIGHT AWAY IF:  °· You have a fever or lasting symptoms for more than 2-3 days. °· You have a fever and your symptoms suddenly get worse. °· You cannot take your medicine or drink fluids as told. °· You have chills and shaking. °· You feel very weak or pass out (faint). °· You do not feel better after 2 days. °MAKE SURE YOU: °· Understand these instructions. °· Will watch your condition. °· Will get help right away if you are not doing well or get worse. °Document Released: 05/25/2004 Document Revised: 10/17/2011 Document Reviewed: 10/05/2010 °ExitCare® Patient Information ©2015 [employer]. This information is not intended to replace advice given to you by your health care provider. Make sure you discuss any questions you have with your health care provider. ° °

## 2014-04-20 NOTE — ED Notes (Signed)
Pt reports intermittent pain to LLQ x 1 week, radiates towards middle of abd. Has mild nausea but denies any vomiting, diarrhea, urinary or vaginal symptoms.

## 2014-07-20 ENCOUNTER — Other Ambulatory Visit: Payer: Self-pay | Admitting: Adult Health

## 2014-07-20 DIAGNOSIS — Z1231 Encounter for screening mammogram for malignant neoplasm of breast: Secondary | ICD-10-CM

## 2014-07-27 ENCOUNTER — Ambulatory Visit (HOSPITAL_COMMUNITY)
Admission: RE | Admit: 2014-07-27 | Discharge: 2014-07-27 | Disposition: A | Discharge status: Home / Self Care | Payer: 59 | Source: Ambulatory Visit | Attending: Adult Health | Admitting: Adult Health

## 2014-07-27 ENCOUNTER — Ambulatory Visit (HOSPITAL_COMMUNITY): Payer: Self-pay

## 2014-07-27 DIAGNOSIS — Z1231 Encounter for screening mammogram for malignant neoplasm of breast: Secondary | ICD-10-CM | POA: Insufficient documentation

## 2014-08-03 ENCOUNTER — Encounter (HOSPITAL_COMMUNITY): Payer: Self-pay | Admitting: *Deleted

## 2014-08-03 ENCOUNTER — Emergency Department (HOSPITAL_COMMUNITY)
Admission: EM | Admit: 2014-08-03 | Discharge: 2014-08-04 | Disposition: A | Discharge status: Home / Self Care | Payer: 59 | Attending: Emergency Medicine | Admitting: Emergency Medicine

## 2014-08-03 ENCOUNTER — Telehealth: Payer: Self-pay | Admitting: *Deleted

## 2014-08-03 DIAGNOSIS — Z79899 Other long term (current) drug therapy: Secondary | ICD-10-CM | POA: Diagnosis not present

## 2014-08-03 DIAGNOSIS — F419 Anxiety disorder, unspecified: Secondary | ICD-10-CM | POA: Diagnosis not present

## 2014-08-03 DIAGNOSIS — S61412A Laceration without foreign body of left hand, initial encounter: Secondary | ICD-10-CM | POA: Insufficient documentation

## 2014-08-03 DIAGNOSIS — Y998 Other external cause status: Secondary | ICD-10-CM | POA: Insufficient documentation

## 2014-08-03 DIAGNOSIS — T148XXA Other injury of unspecified body region, initial encounter: Secondary | ICD-10-CM

## 2014-08-03 DIAGNOSIS — Z23 Encounter for immunization: Secondary | ICD-10-CM | POA: Diagnosis not present

## 2014-08-03 DIAGNOSIS — W540XXA Bitten by dog, initial encounter: Secondary | ICD-10-CM | POA: Insufficient documentation

## 2014-08-03 DIAGNOSIS — Y9289 Other specified places as the place of occurrence of the external cause: Secondary | ICD-10-CM | POA: Insufficient documentation

## 2014-08-03 DIAGNOSIS — M79642 Pain in left hand: Secondary | ICD-10-CM | POA: Diagnosis present

## 2014-08-03 DIAGNOSIS — S51811A Laceration without foreign body of right forearm, initial encounter: Secondary | ICD-10-CM | POA: Diagnosis not present

## 2014-08-03 DIAGNOSIS — T07XXXA Unspecified multiple injuries, initial encounter: Secondary | ICD-10-CM

## 2014-08-03 DIAGNOSIS — Y9389 Activity, other specified: Secondary | ICD-10-CM | POA: Insufficient documentation

## 2014-08-03 MED ORDER — POVIDONE-IODINE 10 % EX SOLN
CUTANEOUS | Status: AC
  Filled 2014-08-03: qty 118

## 2014-08-03 MED ORDER — TETANUS-DIPHTH-ACELL PERTUSSIS 5-2.5-18.5 LF-MCG/0.5 IM SUSP
0.5000 mL | Freq: Once | INTRAMUSCULAR | Status: AC
  Administered 2014-08-03: 0.5 mL via INTRAMUSCULAR
  Filled 2014-08-03: qty 0.5

## 2014-08-03 MED ORDER — LIDOCAINE HCL (PF) 2 % IJ SOLN
10.0000 mL | Freq: Once | INTRAMUSCULAR | Status: AC
  Administered 2014-08-03: 10 mL
  Filled 2014-08-03: qty 10

## 2014-08-03 NOTE — ED Notes (Signed)
Pt reports being bitten by friend's dog. Dog has had all shots.  Small laceration on left hand.  Clean and bleeding controlled.

## 2014-08-03 NOTE — Telephone Encounter (Signed)
Left message x 1. JSY 

## 2014-08-03 NOTE — Telephone Encounter (Signed)
Spoke with pt. Pt was wanting her mammogram results. I advised it showed no findings suspicious for malignancy. Recommends a screening mammogram in 1 year. Pt voiced understanding. JSY

## 2014-08-04 MED ORDER — AMOXICILLIN-POT CLAVULANATE 875-125 MG PO TABS
1.0000 | ORAL_TABLET | Freq: Two times a day (BID) | ORAL | Status: DC
Start: 2014-08-04 — End: 2014-09-25

## 2014-08-04 MED ORDER — AMOXICILLIN-POT CLAVULANATE 875-125 MG PO TABS
1.0000 | ORAL_TABLET | Freq: Once | ORAL | Status: AC
  Administered 2014-08-04: 1 via ORAL
  Filled 2014-08-04: qty 1

## 2014-08-04 MED ORDER — ONDANSETRON HCL 4 MG PO TABS
4.0000 mg | ORAL_TABLET | Freq: Once | ORAL | Status: AC
  Administered 2014-08-04: 4 mg via ORAL
  Filled 2014-08-04: qty 1

## 2014-08-04 NOTE — Discharge Instructions (Signed)
Please keep your wound clean and dry. Please have the sutures removed in 7 days. Augmentin two times daily with food. Please see Dr Jeral PinchBkurnett or return to the ED if signs of advancing infection. Animal Bite Animal bite wounds can get infected. It is important to get proper medical treatment. Ask your doctor if you need a rabies shot. HOME CARE   Follow your doctor's instructions for taking care of your wound.  Only take medicine as told by your doctor.  Take your medicine (antibiotics) as told. Finish them even if you start to feel better.  Keep all doctor visits as told. You may need a tetanus shot if:   You cannot remember when you had your last tetanus shot.  You have never had a tetanus shot.  The injury broke your skin. If you need a tetanus shot and you choose not to have one, you may get tetanus. Sickness from tetanus can be serious. GET HELP RIGHT AWAY IF:   Your wound is warm, red, sore, or puffy (swollen).  You notice yellowish-white fluid (pus) or a bad smell coming from the wound.  You see a red line on the skin coming from the wound.  You have a fever, chills, or you feel sick.  You feel sick to your stomach (nauseous), or you throw up (vomit).  Your pain does not go away, or it gets worse.  You have trouble moving the injured part.  You have questions or concerns. MAKE SURE YOU:   Understand these instructions.  Will watch your condition.  Will get help right away if you are not doing well or get worse. Document Released: 04/17/2005 Document Revised: 07/10/2011 Document Reviewed: 12/07/2010 [facility] Patient Information 2015 [employer], [place]. This information is not intended to replace advice given to you by your health care provider. Make sure you discuss any questions you have with your health care provider.

## 2014-08-04 NOTE — ED Notes (Signed)
Pt alert & oriented x4, stable gait. Patient given discharge instructions, paperwork & prescription(s). Patient  instructed to stop at the registration desk to finish any additional paperwork. Patient verbalized understanding. Pt left department w/ no further questions. 

## 2014-08-04 NOTE — ED Provider Notes (Signed)
CSN: [id number]     Arrival date & time 08/03/14  2059 History   First MD Initiated Contact with Patient 08/03/14 2332     Chief Complaint  Patient presents with  . Animal Bite     (Consider location/radiation/quality/duration/timing/severity/associated sxs/prior Treatment) Patient is a 44 y.o. female presenting with animal bite. The history is provided by the patient.  Animal Bite Contact animal:  Dog Time since incident:  3 hours Pain details:    Quality:  Aching   Severity:  Moderate   Timing:  Intermittent   Progression:  Worsening Incident location:  Another residence Animal's rabies vaccination status:  Up to date Animal in possession: yes   Tetanus status:  Out of date Relieved by:  Nothing Worsened by:  Nothing tried Ineffective treatments:  None tried Associated symptoms: no fever and no numbness     Past Medical History  Diagnosis Date  . Anxiety    Past Surgical History  Procedure Laterality Date  . Abdominal hysterectomy    . Bladder surgery     Family History  Problem Relation Age of Onset  . Adopted: Yes   History  Substance Use Topics  . Smoking status: Never Smoker   . Smokeless tobacco: Never Used  . Alcohol Use: Yes     Comment: occassionally   OB History    Gravida Para Term Preterm AB TAB SAB Ectopic Multiple Living   Review of Systems  Constitutional: Negative for fever.  Neurological: Negative for numbness.  All other systems reviewed and are negative.     Allergies  Demerol  Home Medications   Prior to Admission medications   Medication Sig Start Date End Date Taking? Authorizing Provider  amoxicillin-clavulanate (AUGMENTIN) 875-125 MG per tablet Take 1 tablet by mouth every 12 (twelve) hours. 08/04/14   Ivery Quale, PA-C  busPIRone (BUSPAR) 5 MG tablet TAKE 1 TABLET (5 MG TOTAL) BY MOUTH 3 (THREE) TIMES DAILY. 04/14/14   Adline Potter, NP  busPIRone (BUSPAR) 5 MG tablet Take 5 mg by mouth 2 (two) times  daily.    Historical Provider, MD  ciprofloxacin (CIPRO) 500 MG tablet Take 1 tablet (500 mg total) by mouth every 12 (twelve) hours. 04/20/14   Rolland Porter, MD  HYDROcodone-acetaminophen (NORCO/VICODIN) 5-325 MG per tablet Take 2 tablets by mouth every 4 (four) hours as needed. 04/20/14   Rolland Porter, MD  LORazepam (ATIVAN) 0.5 MG tablet TAKE 1 TABLET BY MOUTH TWICE A DAY AS NEEDED 02/16/14   Adline Potter, NP  LORazepam (ATIVAN) 0.5 MG tablet Take 0.5 mg by mouth 2 (two) times daily as needed for anxiety.    Historical Provider, MD  ondansetron (ZOFRAN ODT) 4 MG disintegrating tablet Take 1 tablet (4 mg total) by mouth every 8 (eight) hours as needed for nausea. 04/20/14   Rolland Porter, MD   BP 149/78 mmHg  Pulse 87  Temp(Src) 98.1 F (36.7 C) (Oral)  Resp 20  Ht  (1.778 m)  Wt 170 lb (77.111 kg)  BMI 24.39 kg/m2  SpO2 100% Physical Exam  Musculoskeletal:       Arms:      Hands:   ED Course    LACERATION REPAIR Date/Time: 08/04/2014 12:44 AM Performed by: Ivery Quale Authorized by: Ivery Quale Consent: Verbal consent obtained. Risks and benefits: risks, benefits and alternatives were discussed Consent given by: patient Patient understanding: patient states understanding of the  procedure being performed Patient identity confirmed: arm band Time out: Immediately prior to procedure a "time out" was called to verify the correct patient, procedure, equipment, support staff and site/side marked as required. Body area: upper extremity Location details: left hand Laceration length: 2.3 cm Foreign bodies: no foreign bodies Tendon involvement: none Nerve involvement: none Vascular damage: no Anesthesia: local infiltration Local anesthetic: lidocaine 2% without epinephrine Patient sedated: no Preparation: Patient was prepped and draped in the usual sterile fashion. Irrigation solution: saline Amount of cleaning: standard Skin closure: 4-0 nylon Number of sutures:  2 Technique: simple Approximation: loose Approximation difficulty: simple Dressing: gauze roll Patient tolerance: Patient tolerated the procedure well with no immediate complications Comments: 1.5cm Laceration of the right forearm. Infiltrated with 2% lidocaine. Repaired with one interrupted suture of 4-0 nylon loosely. Dressing applied.   (including critical care time) Labs Review Labs Reviewed - No data to display  Imaging Review No results found.   EKG Interpretation None      MDM  Pt sustained lacerations to the left hand and right forearm from friends dog. Dog up-to -date with immunizations. Pt's tetanus updated and wounds repaired. Pt treated with augmentin. Pt to have sutures removed in 7 days. She will be seen sooner if any signs of infection.   Final diagnoses:  Animal bite  Multiple lacerations    *I have reviewed nursing notes, vital signs, and all appropriate lab and imaging results for this patient.[address]**    Amen Staszak, PA-C 08/04/14 [phone]  Geoffery Lyonsouglas Delo, MD 08/04/14 [phone]

## 2014-08-10 ENCOUNTER — Encounter (HOSPITAL_COMMUNITY): Payer: Self-pay | Admitting: Cardiology

## 2014-08-10 ENCOUNTER — Emergency Department (HOSPITAL_COMMUNITY)
Admission: EM | Admit: 2014-08-10 | Discharge: 2014-08-10 | Disposition: A | Discharge status: Home / Self Care | Payer: 59 | Attending: Emergency Medicine | Admitting: Emergency Medicine

## 2014-08-10 DIAGNOSIS — F419 Anxiety disorder, unspecified: Secondary | ICD-10-CM | POA: Insufficient documentation

## 2014-08-10 DIAGNOSIS — Z79899 Other long term (current) drug therapy: Secondary | ICD-10-CM | POA: Diagnosis not present

## 2014-08-10 DIAGNOSIS — Z4802 Encounter for removal of sutures: Secondary | ICD-10-CM | POA: Insufficient documentation

## 2014-08-10 MED ORDER — BACITRACIN ZINC 500 UNIT/GM EX OINT
TOPICAL_OINTMENT | CUTANEOUS | Status: AC
  Administered 2014-08-10: 1
  Filled 2014-08-10: qty 0.9

## 2014-08-10 NOTE — ED Provider Notes (Signed)
CSN: [id number]     Arrival date & time 08/10/14  1343 History   First MD Initiated Contact with Patient 08/10/14 1430     Chief Complaint  Patient presents with  . Suture / Staple Removal     (Consider location/radiation/quality/duration/timing/severity/associated sxs/prior Treatment) HPI  Patient presents for suture removal. She was seen on April 4th following a dog bite. She sustained a puncture wound to the left hand as well as a laceration to the right forearm. She was placed on Augmentin and sutures were placed. She is here for suture removal. Denies any fevers or complications from the wounds. Has not noted any what redness.  Past Medical History  Diagnosis Date  . Anxiety    Past Surgical History  Procedure Laterality Date  . Abdominal hysterectomy    . Bladder surgery     Family History  Problem Relation Age of Onset  . Adopted: Yes   History  Substance Use Topics  . Smoking status: Never Smoker   . Smokeless tobacco: Never Used  . Alcohol Use: Yes     Comment: occassionally   OB History    Gravida Para Term Preterm AB TAB SAB Ectopic Multiple Living   2 2        2      Review of Systems  Constitutional: Negative for fever.  Skin: Positive for wound. Negative for color change.  All other systems reviewed and are negative.     Allergies  Demerol  Home Medications   Prior to Admission medications   Medication Sig Start Date End Date Taking? Authorizing Provider  amoxicillin-clavulanate (AUGMENTIN) 875-125 MG per tablet Take 1 tablet by mouth every 12 (twelve) hours. 08/04/14  Yes Ivery QualeHobson Bryant, PA-C  busPIRone (BUSPAR) 5 MG tablet TAKE 1 TABLET (5 MG TOTAL) BY MOUTH 3 (THREE) TIMES DAILY. 04/14/14  Yes Adline PotterJennifer A Griffin, NP  LORazepam (ATIVAN) 0.5 MG tablet TAKE 1 TABLET BY MOUTH TWICE A DAY AS NEEDED 02/16/14  Yes Adline PotterJennifer A Griffin, NP  ciprofloxacin (CIPRO) 500 MG tablet Take 1 tablet (500 mg total) by mouth every 12 (twelve) hours. Patient not  taking: Reported on 08/10/2014 04/20/14   Rolland PorterMark James, MD  HYDROcodone-acetaminophen (NORCO/VICODIN) 5-325 MG per tablet Take 2 tablets by mouth every 4 (four) hours as needed. Patient not taking: Reported on 08/10/2014 04/20/14   Rolland PorterMark James, MD  ondansetron (ZOFRAN ODT) 4 MG disintegrating tablet Take 1 tablet (4 mg total) by mouth every 8 (eight) hours as needed for nausea. Patient not taking: Reported on 08/10/2014 04/20/14   Rolland PorterMark James, MD   BP 128/64 mmHg  Pulse 52  Temp(Src) 98.7 F (37.1 C) (Oral)  Resp 16  Ht 5\' 10"  (1.778 m)  Wt 170 lb (77.111 kg)  BMI 24.39 kg/m2  SpO2 100% Physical Exam  Constitutional: She is oriented to person, place, and time. She appears well-developed and well-nourished. No distress.  HENT:  Head: Normocephalic and atraumatic.  Cardiovascular: Normal rate and regular rhythm.   Pulmonary/Chest: Effort normal. No respiratory distress.  Musculoskeletal:  Suture site over her left hand with 2 sutures in place, no erythema or fluctuance, wound well healing Suture site over right forearm with one suture in place, wound well healing, no signs of infection  Neurological: She is alert and oriented to person, place, and time.  Skin: Skin is warm and dry.  Psychiatric: She has a normal mood and affect.  Nursing note and vitals reviewed.   ED Course  Procedures (including critical care  time) Labs Review Labs Reviewed - No data to display  Imaging Review No results found.   EKG Interpretation None      MDM   Final diagnoses:  Visit for suture removal    Patient presents for suture removal. Sutures removed by nurse. Wounds appear to be well healing and without evidence of infection. Patient was given strict return precautions.  After history, exam, and medical workup I feel the patient has been appropriately medically screened and is safe for discharge home. Pertinent diagnoses were discussed with the patient. Patient was given return  precautions.    Shon Baton, MD 08/10/14 [phone]

## 2014-08-10 NOTE — ED Notes (Signed)
Here for suture removal

## 2014-08-10 NOTE — Discharge Instructions (Signed)

## 2014-08-20 ENCOUNTER — Encounter (HOSPITAL_COMMUNITY): Payer: Self-pay | Admitting: Emergency Medicine

## 2014-08-20 ENCOUNTER — Emergency Department (HOSPITAL_COMMUNITY)
Admission: EM | Admit: 2014-08-20 | Discharge: 2014-08-20 | Disposition: A | Discharge status: Home / Self Care | Payer: 59 | Source: Home / Self Care | Attending: Family Medicine | Admitting: Family Medicine

## 2014-08-20 DIAGNOSIS — M7652 Patellar tendinitis, left knee: Secondary | ICD-10-CM | POA: Diagnosis not present

## 2014-08-20 MED ORDER — METHYLPREDNISOLONE 4 MG PO TBPK: ORAL_TABLET | Freq: Every day | ORAL | Status: DC

## 2014-08-20 NOTE — Discharge Instructions (Signed)
Patellar Tendinitis, Jumper's Knee °with Rehab °Tendinitis is inflammation of a tendon. Tendonitis of the tendon below the kneecap (patella) is known as patellar tendonitis. Patellar tendonitis is a common cause of pain below the kneecap (infrapatellar). Patellar tendonitis may involve a tear (strain) in the ligament. Strains are classified into three categories. Grade 1 strains cause pain, but the tendon is not lengthened. Grade 2 strains include a lengthened ligament, due to the ligament being stretched or partially ruptured. With grade 2 strains there is still function, although function may be decreased. Grade 3 strains involve a complete tear of the tendon or muscle, and function is usually impaired. Patellar tendon strains are usually grade 1 or 2.  °SYMPTOMS  °· Pain, tenderness, swelling, warmth, or redness over the patellar tendon (just below the kneecap). °· Pain and loss of strength (sometimes), with forcefully straightening the knee (especially when jumping or rising from a seated or squatting position), or bending the knee completely (squatting or kneeling). °· Crackling sound (crepitation) when the tendon is moved or touched. °CAUSES  °Patellar tendonitis is caused by injury to the patellar tendon. The inflammation is the body's healing response. Common causes of injury include: °· Stress from a sudden increase in intensity, frequency, or duration of training. °· Overuse of the thigh muscles (quadriceps) and patellar tendon. °· Direct hit (trauma) to the knee or patellar tendon. °RISK INCREASES WITH: °· Sports that require sudden, explosive quadriceps contraction, such as jumping, quick starts, or kicking. °· Running sports, especially running down hills. °· Poor strength and flexibility of the thigh and knee. °· Flat feet. °PREVENTION °· Warm up and stretch properly before activity. °· Allow for adequate recovery between workouts. °· Maintain physical fitness: °¨ Strength, flexibility, and  endurance. °¨ Cardiovascular fitness. °· Protect the knee joint with taping, protective strapping, bracing, or elastic compression bandage. °· Wear arch supports (orthotics). °PROGNOSIS  °If treated properly, patellar tendonitis usually heals within 6 weeks.  °RELATED COMPLICATIONS  °· Longer healing time if not properly treated or if not given enough time to heal. °· Recurring symptoms if activity is resumed too soon, with overuse, with a direct blow, or when using poor technique. °· If untreated, tendon rupture requiring surgery. °TREATMENT °Treatment first involves the use of ice and medicine to reduce pain and inflammation. The use of strengthening and stretching exercises may help reduce pain with activity. These exercises may be performed at home or with a therapist. Serious cases of tendonitis may require restraining the knee for 10 to 14 days to prevent stress on the tendon and to promote healing. Crutches may be used (uncommon) until you can walk without a limp. For cases in which nonsurgical treatment is unsuccessful, surgery may be advised to remove the inflamed tendon lining (sheath). Surgery is rare, and is only advised after at least 6 months of nonsurgical treatment. °MEDICATION  °· If pain medicine is needed, nonsteroidal anti-inflammatory medicines (aspirin and ibuprofen), or other minor pain relievers (acetaminophen), are often advised. °· Do not take pain medicine for 7 days before surgery. °· Prescription pain relievers may be given if your caregiver thinks they are needed. Use only as directed and only as much as you need. °HEAT AND COLD °· Cold treatment (icing) should be applied for 10 to 15 minutes every 2 to 3 hours for inflammation and pain, and immediately after activity that aggravates your symptoms. Use ice packs or an ice massage. °· Heat treatment may be used before performing stretching and strengthening activities   prescribed by your caregiver, physical therapist, or athletic trainer.  Use a heat pack or a warm water soak. °SEEK MEDICAL CARE IF: °· Symptoms get worse or do not improve in 2 weeks, despite treatment. °· New, unexplained symptoms develop. (Drugs used in treatment may produce side effects.) °EXERCISES °RANGE OF MOTION (ROM) AND STRETCHING EXERCISES - Patellar Tendinitis (Jumper's Knee) °These are some of the initial exercises with which you may start your rehabilitation program, until you see your caregiver again or until your symptoms are resolved. Remember:  °· Flexible tissue is more tolerant of the stresses placed on it during activities. °· Each stretch should be held for 20 to 30 seconds. °· A gentle stretching sensation should be felt. °STRETCH - Hamstrings, Supine °· Lie on your back. Loop a belt or towel over the ball of your right / left foot. °· Straighten your right / left knee and slowly pull on the belt to raise your leg. Do not allow the right / left knee to bend. Keep your opposite leg flat on the floor. °· Raise the leg until you feel a gentle stretch behind your right / left knee or thigh. Hold this position for __________ seconds. °Repeat __________ times. Complete this stretch __________ times per day.  °STRETCH - Hamstrings, Doorway °· Lie on your back with your right / left leg extended and resting on the wall, and the opposite leg flat on the ground through the door. At first, position your bottom farther away from the wall. °· Keep your right / left knee straight. If you feel a stretch behind your knee or thigh, hold this position for __________ seconds. °· If you do not feel a stretch, scoot your bottom closer to the door, and hold __________ seconds. °Repeat __________ times. Complete this stretch __________ times per day.  °STRETCH - Hamstrings, Standing °· Stand or sit and extend your right / left leg, placing your foot on a chair or foot stool. °· Keep a slight arch in your low back and your hips straight forward. °· Lead with your chest and lean forward  at the waist until you feel a gentle stretch in the back of your right / left knee or thigh. (When done correctly, this exercise requires leaning only a small distance.) °· Hold this position for __________ seconds. °Repeat __________ times. Complete this stretch __________ times per day. °STRETCH - Adductors, Lunge °· While standing, spread your legs, with your right / left leg behind you. °· Lean away from your right / left leg by bending your opposite knee. You may rest your hands on your thigh for balance. °· You should feel a stretch in your right / left inner thigh. Hold for __________ seconds. °Repeat __________ times. Complete this exercise __________ times per day.  °STRENGTHENING EXERCISES - Patellar Tendinitis (Jumper's Knee) °These exercises may help you when beginning to rehabilitate your injury. They may resolve your symptoms with or without further involvement from your physician, physical therapist or athletic trainer. While completing these exercises, remember:  °· Muscles can gain both the endurance and the strength needed for everyday activities through controlled exercises. °· Complete these exercises as instructed by your physician, physical therapist or athletic trainer. Increase the resistance and repetitions only as guided by your caregiver. °STRENGTH - Quadriceps, Isometrics °· Lie on your back with your right / left leg extended and your opposite knee bent. °· Gradually tense the muscles in the front of your right / left thigh. You should see   either your kneecap slide up toward your hip or increased dimpling just above the knee. This motion will push the back of the knee down toward the floor, mat, or bed on which you are lying. °· Hold the muscle as tight as you can, without increasing your pain, for __________ seconds. °· Relax the muscles slowly and completely in between each repetition. °Repeat __________ times. Complete this exercise __________ times per day.  °STRENGTH - Quadriceps,  Short Arcs °· Lie on your back. Place a __________ inch towel roll under your right / left knee, so that the knee bends slightly. °· Raise only your lower leg by tightening the muscles in the front of your thigh. Do not allow your thigh to rise. °· Hold this position for __________ seconds. °Repeat __________ times. Complete this exercise __________ times per day.  °OPTIONAL ANKLE WEIGHTS: Begin with ____________________, but DO NOT exceed ____________________. Increase in 1 pound/ 0.5 kilogram increments. °STRENGTH - Quadriceps, Straight Leg Raises  °Quality counts! Watch for signs that the quadriceps muscle is working, to be sure you are strengthening the correct muscles and not "cheating" by substituting with healthier muscles. °· Lay on your back with your right / left leg extended and your opposite knee bent. °· Tense the muscles in the front of your right / left thigh. You should see either your kneecap slide up or increased dimpling just above the knee. Your thigh may even shake a bit. °· Tighten these muscles even more and raise your leg 4 to 6 inches off the floor. Hold for __________ seconds. °· Keeping these muscles tense, lower your leg. °· Relax the muscles slowly and completely between each repetition. °Repeat __________ times. Complete this exercise __________ times per day.  °STRENGTH - Quadriceps, Squats °· Stand in a door frame so that your feet and knees are in line with the frame. °· Use your hands for balance, not support, on the frame. °· Slowly lower your weight, bending at the hips and knees. Keep your lower legs upright so that they are parallel with the door frame. Squat only within the range that does not increase your knee pain. Never let your hips drop below your knees. °· Slowly return upright, pushing with your legs, not pulling with your hands. °Repeat __________ times. Complete this exercise __________ times per day.  °STRENGTH - Quadriceps, Step-Downs °· Stand on the edge of a step  stool or stair. Be prepared to use a countertop or wall for balance, if needed. °· Keeping your right / left knee directly over the middle of your foot, slowly touch your opposite heel to the floor or lower step. Do not go all the way to the floor if your knee pain increases; just go as far as you can without increased discomfort. Use your right / left leg muscles, not gravity to lower your body weight. °· Slowly push your body weight back up to the starting position. °Repeat __________ times. Complete this exercise __________ times per day.  °Document Released: 04/17/2005 Document Revised: 09/01/2013 Document Reviewed: 07/30/2008 °[employer]® Patient Information ©2015 [employer]. This information is not intended to replace advice given to you by your health care provider. Make sure you discuss any questions you have with your health care provider. ° °Repetitive Strain Injuries °Repetitive strain injuries (RSIs) result from overuse or misuse of soft tissues including muscles, tendons, or nerves. Tendons are the cord-like structures that attach muscles to bones. RSIs can affect almost any part of the body. However,   RSIs are most common in the arms (thumbs, wrists, elbows, shoulders) and legs (ankles, knees). Common medical conditions that are often caused by repetitive strain include carpal tunnel syndrome, tennis or golfer's elbow, bursitis, and tendonitis. If RSIs are treated early, and the repeated activity is reduced or removed, the severity and length of your problems can usually be reduced. RSIs are also called cumulative trauma disorders (CTD).  °CAUSES  °Many RSIs occur due to repeating the same activity at work over weeks or months without sufficient rest, such as prolonged typing. RSIs also commonly occur when a hobby or sport is done repeatedly without sufficient rest. RSIs can also occur due to repeated strain or stress on a body part in someone who has one or more risk factors for RSIs. °RISK  FACTORS °Workplace risk factors °· Frequent computer use, especially if your workstation is not adjusted for your body type. °· Infrequent rest breaks. °· Working in a high-pressure environment. °· Working at a fast pace. °· Repeating the same motion, such as frequent typing. °· Working in an awkward position or holding the same position for a long time. °· Forceful movements such as lifting, pulling, or pushing. °· Vibration caused by using power tools. °· Working in cold temperatures. °· Job stress. °Personal risk factors °· Poor posture. °· Being loose-jointed. °· Not exercising regularly. °· Being overweight. °· Arthritis, diabetes, thyroid problems, or other long-term (chronic) medical conditions. °· Vitamin deficiencies. °· Keeping your fingernails long. °· An unhealthy, stressful, or inactive lifestyle. °· Not sleeping well. °SYMPTOMS  °Symptoms often begin at work but become more noticeable after the repeated stress has ended. For example, you may develop fatigue or soreness in your  wrist while typing at work, and at night you may develop numbness and tingling in your fingers. Common symptoms include:  °· Burning, shooting, or aching pain, especially in the fingers, palms, wrists, forearms, or shoulders. °· Tenderness. °· Swelling. °· Tingling, numbness, or loss of feeling. °· Pain with certain activities, such as turning a doorknob or reaching above your head. °· Weakness, heaviness, or loss of coordination in your hand. °· Muscle spasms or tightness. °In some cases, symptoms can become so intense that it is difficult to perform everyday tasks. Symptoms that do not improve with rest may indicate a more serious condition.  °DIAGNOSIS  °Your caregiver may determine the type of RSI you have based on your medical evaluation and a description of your activities.  °TREATMENT  °Treatment depends on the severity and type of RSI you have. Your caregiver may recommend rest for the affected body part, medicines, and  physical or occupational therapy to reduce pain, swelling, and soreness. Discuss the activities you do repeatedly with your caregiver. Your caregiver can help you decide whether you need to change your activities. An RSI may take months or years to heal, especially if the affected body part gets insufficient rest. In some cases, such as severe carpal tunnel syndrome, surgery may be recommended. °PREVENTION °· Talk with your supervisor to make sure you have the proper equipment for your work station. °· Maintain good posture at your desk or work station with: °¨ Feet flat on the floor. °¨ Knees directly over the feet, bent at a right angle. °¨ Lower back supported by your chair or a cushion in the curve of your lower back. °¨ Shoulders and arms relaxed and at your sides. °¨ Neck relaxed and not bent forwards or backwards. °¨ Your desk and computer workstation properly adjusted to your body type. °¨   Your chair adjusted so there is no excess pressure on the back of your thighs. °¨ The keyboard resting above your thighs. You should be able to reach the keys with your elbows at your side, bent at a right angle. Your arms should be supported on forearm rests, with your forearms parallel to the ground. °¨ The computer mouse within easy reach. °¨ The monitor directly in front of you, so that your eyes are aligned with the top of the screen. The screen should be about 15 to 25 inches from your eyes. °· While typing, keep your wrist straight, in a neutral position. Move your entire arm when you move your mouse or when typing hard-to-reach keys. °· Only use your computer as much as you need to for work. Do not use it during breaks. °· Take breaks often from any repeated activity. Alternate with another task which requires you to use different muscles, or rest at least once every hour. °· Change positions regularly. If you spend a lot of time sitting, get up, walk around, and stretch. °· Do not hold pens or pencils tightly when  writing. °· Exercise regularly. °· Maintain a normal weight. °· Eat a diet with plenty of vegetables, whole grains, and fruit. °· Get sufficient, restful sleep. °HOME CARE INSTRUCTIONS °· If your caregiver prescribed medicine to help reduce swelling, take it as directed. °· Only take over-the-counter or prescription medicines for pain, discomfort, or fever as directed by your caregiver. °· Reduce, and if needed, stop the activities that are causing your problems until you have no further symptoms. If your symptoms are work-related, you may need to talk to your supervisor about changing your activities. °· When symptoms develop, put ice or a cold pack on the aching area. °¨ Put ice in a plastic bag. °¨ Place a towel between your skin and the bag. °¨ Leave the ice on for 15-20 minutes. °· If you were given a splint to keep your wrist from bending, wear it as instructed. It is important to wear the splint at night. Use the splint for as long as your caregiver recommends. °SEEK MEDICAL CARE IF: °· You develop new problems. °· Your problems do not get better with medicine. °MAKE SURE YOU: °· Understand these instructions. °· Will watch your condition. °· Will get help right away if you are not doing well or get worse. °Document Released: 04/07/2002 Document Revised: 10/17/2011 Document Reviewed: 06/08/2011 °[employer]® Patient Information ©2015 [employer]. This information is not intended to replace advice given to you by your health care provider. Make sure you discuss any questions you have with your health care provider. ° °

## 2014-08-20 NOTE — ED Provider Notes (Signed)
CSN: [id number]     Arrival date & time 08/20/14  0801 History   First MD Initiated Contact with Patient 08/20/14 0818     Chief Complaint  Patient presents with  . 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 States she started and walking/jogging program about one month ago. No specific injury. No previous injury or surgery. Using neoprene sleeve and NSAIDs for symptoms with limited relief. Has not tried ice.   Patient is a 44 y.o. female presenting with knee pain. The history is provided by the patient.  Knee Pain Location:  Knee Time since incident:  1 week Injury: no   Knee location:  L knee Chronicity:  New Dislocation: no     Past Medical History  Diagnosis Date  . Anxiety    Past Surgical History  Procedure Laterality Date  . Abdominal hysterectomy    . Bladder surgery     Family History  Problem Relation Age of Onset  . Adopted: Yes   History  Substance Use Topics  . Smoking status: Never Smoker   . Smokeless tobacco: Never Used  . Alcohol Use: Yes     Comment: occassionally   OB History    Gravida Para Term Preterm AB TAB SAB Ectopic Multiple Living   Review of Systems  All other systems reviewed and are negative.   Allergies  Demerol  Home Medications   Prior to Admission medications   Medication Sig Start Date End Date Taking? Authorizing Provider  amoxicillin-clavulanate (AUGMENTIN) 875-125 MG per tablet Take 1 tablet by mouth every 12 (twelve) hours. 08/04/14   Ivery Quale, PA-C  busPIRone (BUSPAR) 5 MG tablet TAKE 1 TABLET (5 MG TOTAL) BY MOUTH 3 (THREE) TIMES DAILY. 04/14/14   Adline Potter, NP  ciprofloxacin (CIPRO) 500 MG tablet Take 1 tablet (500 mg total) by mouth every 12 (twelve) hours. Patient not taking: Reported on 08/10/2014 04/20/14   Rolland Porter, MD    HYDROcodone-acetaminophen (NORCO/VICODIN) 5-325 MG per tablet Take 2 tablets by mouth every 4 (four) hours as needed. Patient not taking: Reported on 08/10/2014 04/20/14   Rolland Porter, MD  LORazepam (ATIVAN) 0.5 MG tablet TAKE 1 TABLET BY MOUTH TWICE A DAY AS NEEDED 02/16/14   Adline Potter, NP  methylPREDNISolone (MEDROL DOSEPAK) 4 MG TBPK tablet Take by mouth daily. As directed 08/20/14   Mathis Fare Akashdeep Chuba, PA  ondansetron (ZOFRAN ODT) 4 MG disintegrating tablet Take 1 tablet (4 mg total) by mouth every 8 (eight) hours as needed for nausea. Patient not taking: Reported on 08/10/2014 04/20/14   Rolland Porter, MD   BP 125/70 mmHg  Pulse 55  Temp(Src) 97.7 F (36.5 C) (Oral)  Resp 16  SpO2 100% Physical Exam  Constitutional: She is oriented to person, place, and time. She appears well-developed and well-nourished. No distress.  HENT:  Head: Normocephalic and atraumatic.  Cardiovascular: Normal rate.   Pulmonary/Chest: Effort normal.  Musculoskeletal:       Left knee: She exhibits effusion. She exhibits normal range of motion, no swelling, no ecchymosis, no deformity, no laceration, no erythema, normal alignment, no LCL laxity, normal patellar mobility, no bony tenderness, normal meniscus and no MCL laxity. Tenderness found. Medial joint line and patellar tendon tenderness noted. No lateral joint line, no MCL and no LCL  tenderness noted.       Legs: No ligamentous instability  Neurological: She is alert and oriented to person, place, and time.  Skin: Skin is warm and dry. No rash noted. No erythema.  Psychiatric: She has a normal mood and affect. Her behavior is normal.  Nursing note and vitals reviewed.   ED Course  Procedures (including critical care time) Labs Review Labs Reviewed - No data to display  Imaging Review No results found.   MDM   1. Patellar tendonitis of left knee   No impact exercise x 7 days RICE therapy May continue neoprene sleeve Medrol dose pack  as directed PCP follow up if no improvement    Ria ClockJennifer Lee H Adiyah Lame, [place]PA 08/20/14 [phone]

## 2014-08-20 NOTE — ED Notes (Signed)
Knee pain, left knee pain.  Patient contributes to exercising

## 2014-09-25 ENCOUNTER — Ambulatory Visit (INDEPENDENT_AMBULATORY_CARE_PROVIDER_SITE_OTHER): Payer: 59 | Admitting: Adult Health

## 2014-09-25 ENCOUNTER — Encounter: Payer: Self-pay | Admitting: Adult Health

## 2014-09-25 VITALS — BP 136/78 | HR 60 | Ht 69.0 in | Wt 165.0 lb

## 2014-09-25 DIAGNOSIS — F419 Anxiety disorder, unspecified: Secondary | ICD-10-CM

## 2014-09-25 DIAGNOSIS — R319 Hematuria, unspecified: Secondary | ICD-10-CM | POA: Diagnosis not present

## 2014-09-25 DIAGNOSIS — Z01419 Encounter for gynecological examination (general) (routine) without abnormal findings: Secondary | ICD-10-CM

## 2014-09-25 DIAGNOSIS — R8290 Unspecified abnormal findings in urine: Secondary | ICD-10-CM

## 2014-09-25 LAB — POCT URINALYSIS DIPSTICK
Glucose, UA: NEGATIVE
Nitrite, UA: NEGATIVE
PROTEIN UA: NEGATIVE

## 2014-09-25 LAB — HEMOCCULT GUIAC POC 1CARD (OFFICE): Fecal Occult Blood, POC: NEGATIVE

## 2014-09-25 MED ORDER — BUSPIRONE HCL 5 MG PO TABS: ORAL_TABLET | ORAL | Status: DC

## 2014-09-25 MED ORDER — LORAZEPAM 0.5 MG PO TABS: 0.5000 mg | ORAL_TABLET | Freq: Two times a day (BID) | ORAL | Status: DC | PRN

## 2014-09-25 NOTE — Progress Notes (Signed)
Patient ID: Julie [name], female   DOB: 03/05/1971, 44 y.o.   MRN: [id number] History of Present Illness: Julie [name] is a 44 year old white female, married in for a well woman gyn exam.She says her urine has odor.   Current Medications, Allergies, Past Medical History, Past Surgical History, Family History and Social History were reviewed in [facility] electronic medical record.     Review of Systems: Patient denies any headaches, hearing loss, fatigue, blurred vision, shortness of breath, chest pain, abdominal pain, problems with bowel movements, urination, or intercourse. No joint pain or mood swings.She says she is good on buspar and ativan prn for her anxiety, she has lost 35 lbs and is running and has done 2 5Ks.    Physical Exam:BP 136/78 mmHg  Pulse 60  Ht 5\' 9"  (1.753 m)  Wt 165 lb (74.844 kg)  BMI 24.36 kg/m2 urine 1+ blood and 1+leuks  General:  Well developed, well nourished, no acute distress Skin:  Warm and dry Neck:  Midline trachea, normal thyroid, good ROM, no lymphadenopathy Lungs; Clear to auscultation bilaterally Breast:  No dominant palpable mass, retraction, or nipple discharge Cardiovascular: Regular rate and rhythm, has keloid on left scapula Abdomen:  Soft, non tender, no hepatosplenomegaly Pelvic:  External genitalia is normal in appearance, no lesions.  The vagina is normal in appearance. Urethra has no lesions or masses. The cervix and uterus are absent.  No adnexal masses or tenderness noted.Bladder is non tender, no masses felt. Rectal: Good sphincter tone, no polyps, or hemorrhoids felt.  Hemoccult negative. Extremities/musculoskeletal:  No swelling or varicosities noted, no clubbing or cyanosis Psych:  No mood changes, alert and cooperative,seems happy   Impression: Well woman gyn exam no pap Anxiety Bad odor in urine Hematuria     Plan: Check CBC,CMP,TSH and lipids UA C&S sent Mammogram yearly Colonoscopy at 49 Refilled buspar 5 mg #90  take 1 tid with 6 refills Refilled ativan 0.5 mg #30 1 bid prn anxiety with 1 refill Push fluids See Dr Margo AyeHall for keloid removal

## 2014-09-25 NOTE — Patient Instructions (Signed)
Physical in 1 year Mammogram yearly Colonoscopy at 49  

## 2014-09-26 LAB — URINE CULTURE

## 2014-09-26 LAB — COMPREHENSIVE METABOLIC PANEL
A/G RATIO: 2.3 (ref 1.1–2.5)
ALBUMIN: 4.8 g/dL (ref 3.5–5.5)
ALT: 18 IU/L (ref 0–32)
AST: 14 IU/L (ref 0–40)
Alkaline Phosphatase: 85 IU/L (ref 39–117)
BUN/Creatinine Ratio: 14 (ref 9–23)
BUN: 12 mg/dL (ref 6–24)
Bilirubin Total: 0.7 mg/dL (ref 0.0–1.2)
CHLORIDE: 100 mmol/L (ref 97–108)
CO2: 27 mmol/L (ref 18–29)
CREATININE: 0.86 mg/dL (ref 0.57–1.00)
Calcium: 9.7 mg/dL (ref 8.7–10.2)
GFR calc Af Amer: 94 mL/min/{1.73_m2} (ref >59)
GFR calc non Af Amer: 82 mL/min/{1.73_m2} (ref >59)
GLOBULIN, TOTAL: 2.1 g/dL (ref 1.5–4.5)
Glucose: 93 mg/dL (ref 65–99)
POTASSIUM: 4.5 mmol/L (ref 3.5–5.2)
Sodium: 140 mmol/L (ref 134–144)
Total Protein: 6.9 g/dL (ref 6.0–8.5)

## 2014-09-26 LAB — URINALYSIS, ROUTINE W REFLEX MICROSCOPIC
BILIRUBIN UA: NEGATIVE
Glucose, UA: NEGATIVE
Ketones, UA: NEGATIVE
Leukocytes, UA: NEGATIVE
Nitrite, UA: NEGATIVE
Protein, UA: NEGATIVE
RBC, UA: NEGATIVE
Specific Gravity, UA: 1.024 (ref 1.005–1.030)
UUROB: 0.2 mg/dL (ref 0.2–1.0)
pH, UA: 6 (ref 5.0–7.5)

## 2014-09-26 LAB — CBC
HEMOGLOBIN: 15.1 g/dL (ref 11.1–15.9)
Hematocrit: 45.1 % (ref 34.0–46.6)
MCH: 31.1 pg (ref 26.6–33.0)
MCHC: 33.5 g/dL (ref 31.5–35.7)
MCV: 93 fL (ref 79–97)
Platelets: 308 10*3/uL (ref 150–379)
RBC: 4.85 x10E6/uL (ref 3.77–5.28)
RDW: 12.9 % (ref 12.3–15.4)
WBC: 7.6 10*3/uL (ref 3.4–10.8)

## 2014-09-26 LAB — LIPID PANEL
Chol/HDL Ratio: 3.3 ratio units (ref 0.0–4.4)
Cholesterol, Total: 176 mg/dL (ref 100–199)
HDL: 53 mg/dL (ref >39)
LDL CALC: 105 mg/dL — AB (ref 0–99)
TRIGLYCERIDES: 88 mg/dL (ref 0–149)
VLDL Cholesterol Cal: 18 mg/dL (ref 5–40)

## 2014-09-26 LAB — TSH: TSH: 1.45 u[IU]/mL (ref 0.450–4.500)

## 2014-09-29 ENCOUNTER — Telehealth: Payer: Self-pay | Admitting: Adult Health

## 2014-09-29 NOTE — Telephone Encounter (Signed)
Pt aware of labs  

## 2015-08-15 ENCOUNTER — Other Ambulatory Visit: Payer: Self-pay | Admitting: Adult Health

## 2015-09-02 ENCOUNTER — Other Ambulatory Visit: Payer: Self-pay | Admitting: Adult Health

## 2015-09-02 DIAGNOSIS — Z1231 Encounter for screening mammogram for malignant neoplasm of breast: Secondary | ICD-10-CM

## 2015-09-06 ENCOUNTER — Ambulatory Visit (HOSPITAL_COMMUNITY)
Admission: RE | Admit: 2015-09-06 | Discharge: 2015-09-06 | Disposition: A | Discharge status: Home / Self Care | Payer: Managed Care, Other (non HMO) | Source: Ambulatory Visit | Attending: Adult Health | Admitting: Adult Health

## 2015-09-06 DIAGNOSIS — Z1231 Encounter for screening mammogram for malignant neoplasm of breast: Secondary | ICD-10-CM | POA: Insufficient documentation

## 2015-09-09 ENCOUNTER — Encounter: Payer: Self-pay | Admitting: Adult Health

## 2015-09-09 ENCOUNTER — Ambulatory Visit (INDEPENDENT_AMBULATORY_CARE_PROVIDER_SITE_OTHER): Payer: Managed Care, Other (non HMO) | Admitting: Adult Health

## 2015-09-09 VITALS — BP 130/70 | HR 56 | Ht 70.0 in | Wt 159.2 lb

## 2015-09-09 DIAGNOSIS — F419 Anxiety disorder, unspecified: Secondary | ICD-10-CM | POA: Diagnosis not present

## 2015-09-09 DIAGNOSIS — Z01411 Encounter for gynecological examination (general) (routine) with abnormal findings: Secondary | ICD-10-CM

## 2015-09-09 DIAGNOSIS — Z1211 Encounter for screening for malignant neoplasm of colon: Secondary | ICD-10-CM | POA: Diagnosis not present

## 2015-09-09 DIAGNOSIS — R319 Hematuria, unspecified: Secondary | ICD-10-CM

## 2015-09-09 DIAGNOSIS — Z01419 Encounter for gynecological examination (general) (routine) without abnormal findings: Secondary | ICD-10-CM

## 2015-09-09 DIAGNOSIS — R8290 Unspecified abnormal findings in urine: Secondary | ICD-10-CM | POA: Insufficient documentation

## 2015-09-09 HISTORY — DX: Unspecified abnormal findings in urine: R82.90

## 2015-09-09 LAB — POCT URINALYSIS DIPSTICK
Glucose, UA: NEGATIVE
Leukocytes, UA: NEGATIVE
Nitrite, UA: NEGATIVE

## 2015-09-09 LAB — HEMOCCULT GUIAC POC 1CARD (OFFICE): Fecal Occult Blood, POC: NEGATIVE

## 2015-09-09 MED ORDER — LORAZEPAM 0.5 MG PO TABS: 0.5000 mg | ORAL_TABLET | Freq: Two times a day (BID) | ORAL | Status: DC | PRN

## 2015-09-09 MED ORDER — BUSPIRONE HCL 5 MG PO TABS: ORAL_TABLET | ORAL | Status: DC

## 2015-09-09 NOTE — Progress Notes (Signed)
Patient ID: Julie [name] [name], female   DOB: 11/22/1970, 45 y.o.   MRN: [id number] History of Present Illness: Julie DusterMichelle is a 45 year old white female, going through a separation, in for a well woman gyn exam and labs, she is sp hysterectomy.She has noticed bad odor to urine and is more anxious lately. Has occasional hot flash.  Current Medications, Allergies, Past Medical History, Past Surgical History, Family History and Social History were reviewed in [facility] electronic medical record.     Review of Systems: Patient denies any headaches, hearing loss, fatigue, blurred vision, shortness of breath, chest pain, abdominal pain, problems with bowel movements, urination, or intercourse. No joint pain or mood swings.See HPI for positives.    Physical Exam:BP 130/70 mmHg  Pulse 56  Ht 5\' 10"  (1.778 m)  Wt 159 lb 3.2 oz (72.213 kg)  BMI 22.84 kg/m2 urine trace blood and protein General:  Well developed, well nourished, no acute distress Skin:  Warm and dry, no rashes  Neck:  Midline trachea, normal thyroid, good ROM, no lymphadenopathy Lungs; Clear to auscultation bilaterally Breast:  No dominant palpable mass, retraction, or nipple discharge Cardiovascular: Regular rate and rhythm Abdomen:  Soft, non tender, no hepatosplenomegaly Pelvic:  External genitalia is normal in appearance, no lesions.  The vagina is normal in appearance. Urethra has no lesions or masses. The cervix and uterus are absent.  No adnexal masses or tenderness noted.Bladder is non tender, no masses felt. Rectal: Good sphincter tone, no polyps, or hemorrhoids felt.  Hemoccult negative. Extremities/musculoskeletal:  No swelling or varicosities noted, no clubbing or cyanosis Psych:  No mood changes, alert and cooperative,seems happy   Impression: Well woman gyn exam no pap Bad odor in urine Anxiety  Hematuria     Plan: UA C&S sent Check CBC,CMP,TSH and lipids,A1c and vitamin D,will talk when labs back  Refilled  buspar 5 mg #90 take 1 tid with 12 refills Refilled ativan 0.5 mg #60 take 1 bid prn anxiety with 1 refill Physical in 1 year Mammogram yearly, just had 09/06/15 Colonoscopy at 49

## 2015-09-09 NOTE — Patient Instructions (Signed)
Physical in 1 year Mammogram yearly Colonoscopy at 49  

## 2015-09-10 ENCOUNTER — Telehealth: Payer: Self-pay | Admitting: Adult Health

## 2015-09-10 LAB — COMPREHENSIVE METABOLIC PANEL
ALBUMIN: 4.8 g/dL (ref 3.5–5.5)
ALT: 15 IU/L (ref 0–32)
AST: 17 IU/L (ref 0–40)
Albumin/Globulin Ratio: 2.2 (ref 1.2–2.2)
Alkaline Phosphatase: 65 IU/L (ref 39–117)
BUN / CREAT RATIO: 15 (ref 9–23)
BUN: 12 mg/dL (ref 6–24)
Bilirubin Total: 0.6 mg/dL (ref 0.0–1.2)
CALCIUM: 9.9 mg/dL (ref 8.7–10.2)
CO2: 25 mmol/L (ref 18–29)
Chloride: 99 mmol/L (ref 96–106)
Creatinine, Ser: 0.78 mg/dL (ref 0.57–1.00)
GFR calc Af Amer: 105 mL/min/{1.73_m2} (ref >59)
GFR calc non Af Amer: 91 mL/min/{1.73_m2} (ref >59)
GLOBULIN, TOTAL: 2.2 g/dL (ref 1.5–4.5)
Glucose: 87 mg/dL (ref 65–99)
POTASSIUM: 4.8 mmol/L (ref 3.5–5.2)
SODIUM: 141 mmol/L (ref 134–144)
Total Protein: 7 g/dL (ref 6.0–8.5)

## 2015-09-10 LAB — LIPID PANEL
CHOL/HDL RATIO: 3.2 ratio (ref 0.0–4.4)
CHOLESTEROL TOTAL: 162 mg/dL (ref 100–199)
HDL: 51 mg/dL (ref >39)
LDL Calculated: 94 mg/dL (ref 0–99)
TRIGLYCERIDES: 87 mg/dL (ref 0–149)
VLDL Cholesterol Cal: 17 mg/dL (ref 5–40)

## 2015-09-10 LAB — CBC
Hematocrit: 44.7 % (ref 34.0–46.6)
Hemoglobin: 14.9 g/dL (ref 11.1–15.9)
MCH: 31.3 pg (ref 26.6–33.0)
MCHC: 33.3 g/dL (ref 31.5–35.7)
MCV: 94 fL (ref 79–97)
Platelets: 275 10*3/uL (ref 150–379)
RBC: 4.76 x10E6/uL (ref 3.77–5.28)
RDW: 13.3 % (ref 12.3–15.4)
WBC: 8.1 10*3/uL (ref 3.4–10.8)

## 2015-09-10 LAB — TSH: TSH: 1.21 u[IU]/mL (ref 0.450–4.500)

## 2015-09-10 LAB — URINALYSIS, ROUTINE W REFLEX MICROSCOPIC
Bilirubin, UA: NEGATIVE
Glucose, UA: NEGATIVE
Ketones, UA: NEGATIVE
NITRITE UA: NEGATIVE
Protein, UA: NEGATIVE
Specific Gravity, UA: 1.023 (ref 1.005–1.030)
Urobilinogen, Ur: 0.2 mg/dL (ref 0.2–1.0)
pH, UA: 5.5 (ref 5.0–7.5)

## 2015-09-10 LAB — MICROSCOPIC EXAMINATION
Casts: NONE SEEN /lpf
Epithelial Cells (non renal): >10 /hpf — AB (ref 0–10)

## 2015-09-10 LAB — HEMOGLOBIN A1C
Est. average glucose Bld gHb Est-mCnc: 123 mg/dL
HEMOGLOBIN A1C: 5.9 % — AB (ref 4.8–5.6)

## 2015-09-10 LAB — VITAMIN D 25 HYDROXY (VIT D DEFICIENCY, FRACTURES): Vit D, 25-Hydroxy: 44.7 ng/mL (ref 30.0–100.0)

## 2015-09-10 NOTE — Telephone Encounter (Signed)
Left message about labs, decrease carbs and increase exercise A1c 5.9, urine culture not back yet, other labs looked good

## 2015-09-11 LAB — URINE CULTURE

## 2015-09-29 ENCOUNTER — Other Ambulatory Visit: Payer: 59 | Admitting: Adult Health

## 2015-11-12 ENCOUNTER — Emergency Department (HOSPITAL_COMMUNITY)
Admission: EM | Admit: 2015-11-12 | Discharge: 2015-11-12 | Disposition: A | Discharge status: Home / Self Care | Payer: Worker's Compensation | Attending: Emergency Medicine | Admitting: Emergency Medicine

## 2015-11-12 ENCOUNTER — Emergency Department (HOSPITAL_COMMUNITY): Payer: Worker's Compensation

## 2015-11-12 DIAGNOSIS — S0093XA Contusion of unspecified part of head, initial encounter: Secondary | ICD-10-CM | POA: Insufficient documentation

## 2015-11-12 DIAGNOSIS — S138XXA Sprain of joints and ligaments of other parts of neck, initial encounter: Secondary | ICD-10-CM | POA: Insufficient documentation

## 2015-11-12 DIAGNOSIS — S139XXA Sprain of joints and ligaments of unspecified parts of neck, initial encounter: Secondary | ICD-10-CM

## 2015-11-12 DIAGNOSIS — Y990 Civilian activity done for income or pay: Secondary | ICD-10-CM | POA: Insufficient documentation

## 2015-11-12 DIAGNOSIS — Y92512 Supermarket, store or market as the place of occurrence of the external cause: Secondary | ICD-10-CM | POA: Diagnosis not present

## 2015-11-12 DIAGNOSIS — W01198A Fall on same level from slipping, tripping and stumbling with subsequent striking against other object, initial encounter: Secondary | ICD-10-CM | POA: Insufficient documentation

## 2015-11-12 DIAGNOSIS — Y9389 Activity, other specified: Secondary | ICD-10-CM | POA: Insufficient documentation

## 2015-11-12 DIAGNOSIS — R51 Headache: Secondary | ICD-10-CM | POA: Diagnosis present

## 2015-11-12 DIAGNOSIS — Z79899 Other long term (current) drug therapy: Secondary | ICD-10-CM | POA: Insufficient documentation

## 2015-11-12 DIAGNOSIS — W19XXXA Unspecified fall, initial encounter: Secondary | ICD-10-CM

## 2015-11-12 MED ORDER — ONDANSETRON HCL 4 MG/2ML IJ SOLN
4.0000 mg | Freq: Once | INTRAMUSCULAR | Status: AC
  Administered 2015-11-12: 4 mg via INTRAVENOUS
  Filled 2015-11-12: qty 2

## 2015-11-12 MED ORDER — MORPHINE SULFATE (PF) 4 MG/ML IV SOLN
4.0000 mg | Freq: Once | INTRAVENOUS | Status: AC
  Administered 2015-11-12: 4 mg via INTRAVENOUS
  Filled 2015-11-12: qty 1

## 2015-11-12 MED ORDER — SODIUM CHLORIDE 0.9 % IV BOLUS (SEPSIS)
500.0000 mL | Freq: Once | INTRAVENOUS | Status: AC
  Administered 2015-11-12: 500 mL via INTRAVENOUS

## 2015-11-12 NOTE — ED Notes (Signed)
Pt was working at [employer] reaching for something in the cooler and fell backward hitting her head on the floor.  No apparent loc

## 2015-11-12 NOTE — Discharge Instructions (Signed)
Rest in quiet dark room. Tylenol or ibuprofen for pain.

## 2015-11-12 NOTE — ED Provider Notes (Signed)
CSN: [id number]     Arrival date & time 11/12/15  1947 History  By signing my name below, I, Linna Darner, attest that this documentation has been prepared under the direction and in the presence of physician practitioner, Donnetta Hutching, MD. Electronically Signed: Linna Darner, Scribe. 11/12/2015. 8:20 PM.     Chief Complaint  Patient presents with  . Fall    The history is provided by the patient, a relative and a friend. No language interpreter was used.    HPI Comments: Julie [name] is a 45 y.o. female who presents to the Emergency Department complaining of sudden onset, constant, severe, posterior head pain s/p falling shortly PTA. Pt was working at [employer] and was reaching for an item in the freezer when her feet slipped out from under her; she hit the back of her head on the floor. Pt notes mild posterior neck pain but states her most severe pain is in her posterior head. She endorses pain exacerbation with palpation to her posterior head. Per her sister and coworker, she has been acting normally since the fall. She denies lower extremity pain, chest pain, abdominal pain, or any other associated symptoms.  Past Medical History  Diagnosis Date  . Anxiety   . Bad odor of urine 09/09/2015   Past Surgical History  Procedure Laterality Date  . Abdominal hysterectomy    . Bladder surgery     Family History  Problem Relation Age of Onset  . Adopted: Yes  . Irritable bowel syndrome Son    Social History  Substance Use Topics  . Smoking status: Never Smoker   . Smokeless tobacco: Never Used  . Alcohol Use: Yes     Comment: occassionally   OB History    Gravida Para Term Preterm AB TAB SAB Ectopic Multiple Living   Review of Systems  A complete 10 system review of systems was obtained and all systems are negative except as noted in the HPI and PMH.   Allergies  Demerol  Home Medications   Prior to Admission medications   Medication Sig Start Date  End Date Taking? Authorizing Provider  busPIRone (BUSPAR) 5 MG tablet TAKE 1 TABLET (5 MG TOTAL) BY MOUTH 3 (THREE) TIMES DAILY. 09/09/15  Yes Adline Potter, NP  LORazepam (ATIVAN) 0.5 MG tablet Take 1 tablet (0.5 mg total) by mouth 2 (two) times daily as needed. 09/09/15  Yes Adline Potter, NP   BP 121/69 mmHg  Pulse 70  Temp(Src) 98.1 F (36.7 C) (Oral)  Resp 17  Ht  (1.778 m)  Wt 156 lb (70.761 kg)  BMI 22.38 kg/m2  SpO2 98% Physical Exam  Constitutional: She is oriented to person, place, and time. She appears well-developed and well-nourished.  HENT:  Head: Normocephalic and atraumatic.  Tender in the occipital area.  Eyes: Conjunctivae are normal.  Neck: Neck supple.  Cardiovascular: Normal rate and regular rhythm.   Pulmonary/Chest: Effort normal and breath sounds normal.  Abdominal: Soft. Bowel sounds are normal.  Musculoskeletal: Normal range of motion.  Minimally tender in cervical spine.  Neurological: She is alert and oriented to person, place, and time.  Skin: Skin is warm and dry.  Psychiatric: She has a normal mood and affect. Her behavior is normal.  Nursing note and vitals reviewed.   ED Course  Procedures (including critical care time)  DIAGNOSTIC STUDIES: Oxygen Saturation is 100% on RA, normal  by my interpretation.    COORDINATION OF CARE: 8:25 PM Discussed treatment plan with pt at bedside and pt agreed to plan.  Results for orders placed or performed in visit on 09/09/15  Urine culture  Result Value Ref Range   Urine Culture, Routine Final report    Urine Culture result 1 Comment   Microscopic Examination  Result Value Ref Range   WBC, UA >30 (A) 0 -  5 /hpf   RBC, UA 0-2 0 -  2 /hpf   Epithelial Cells (non renal) >10 (A) 0 - 10 /hpf   Casts None seen None seen /lpf   Mucus, UA Present Not Estab.   Bacteria, UA Few None seen/Few  Urinalysis, Routine w reflex microscopic (not at [facility])  Result Value Ref Range   Specific Gravity,  UA 1.023 1.005 - 1.030   pH, UA 5.5 5.0 - 7.5   Color, UA Yellow Yellow   Appearance Ur Cloudy (A) Clear   Leukocytes, UA 3+ (A) Negative   Protein, UA Negative Negative/Trace   Glucose, UA Negative Negative   Ketones, UA Negative Negative   RBC, UA Trace (A) Negative   Bilirubin, UA Negative Negative   Urobilinogen, Ur 0.2 0.2 - 1.0 mg/dL   Nitrite, UA Negative Negative   Microscopic Examination See below:   VITAMIN D 25 Hydroxy (Vit-D Deficiency, Fractures)  Result Value Ref Range   Vit D, 25-Hydroxy 44.7 30.0 - 100.0 ng/mL  Hemoglobin A1c  Result Value Ref Range   Hgb A1c MFr Bld 5.9 (H) 4.8 - 5.6 %   Est. average glucose Bld gHb Est-mCnc 123 mg/dL  Lipid panel  Result Value Ref Range   Cholesterol, Total 162 100 - 199 mg/dL   Triglycerides 87 0 - 149 mg/dL   HDL 51 >16 mg/dL   VLDL Cholesterol Cal 17 5 - 40 mg/dL   LDL Calculated 94 0 - 99 mg/dL   Chol/HDL Ratio 3.2 0.0 - 4.4 ratio units  TSH  Result Value Ref Range   TSH 1.210 0.450 - 4.500 uIU/mL  Comprehensive metabolic panel  Result Value Ref Range   Glucose 87 65 - 99 mg/dL   BUN 12 6 - 24 mg/dL   Creatinine, Ser 1.09 0.57 - 1.00 mg/dL   GFR calc non Af Amer 91 >59 mL/min/1.73   GFR calc Af Amer 105 >59 mL/min/1.73   BUN/Creatinine Ratio 15 9 - 23   Sodium 141 134 - 144 mmol/L   Potassium 4.8 3.5 - 5.2 mmol/L   Chloride 99 96 - 106 mmol/L   CO2 25 18 - 29 mmol/L   Calcium 9.9 8.7 - 10.2 mg/dL   Total Protein 7.0 6.0 - 8.5 g/dL   Albumin 4.8 3.5 - 5.5 g/dL   Globulin, Total 2.2 1.5 - 4.5 g/dL   Albumin/Globulin Ratio 2.2 1.2 - 2.2   Bilirubin Total 0.6 0.0 - 1.2 mg/dL   Alkaline Phosphatase 65 39 - 117 IU/L   AST 17 0 - 40 IU/L   ALT 15 0 - 32 IU/L  CBC  Result Value Ref Range   WBC 8.1 3.4 - 10.8 x10E3/uL   RBC 4.76 3.77 - 5.28 x10E6/uL   Hemoglobin 14.9 11.1 - 15.9 g/dL   Hematocrit 60.4 54.0 - 46.6 %   MCV 94 79 - 97 fL   MCH 31.3 26.6 - 33.0 pg   MCHC 33.3 31.5 - 35.7 g/dL   RDW 98.1 19.1 -  47.8 %   Platelets 275 150 -  379 x10E3/uL  POCT occult blood stool  Result Value Ref Range   Fecal Occult Blood, POC Negative Negative   Card #1 Date     Card #2 Fecal Occult Blod, POC     Card #2 Date     Card #3 Fecal Occult Blood, POC     Card #3 Date    POCT urinalysis dipstick  Result Value Ref Range   Color, UA yellow    Clarity, UA cloudy    Glucose, UA neg    Bilirubin, UA     Ketones, UA     Spec Grav, UA     Blood, UA trace    pH, UA     Protein, UA trace    Urobilinogen, UA     Nitrite, UA neg    Leukocytes, UA Negative Negative   Ct Head Wo Contrast  11/12/2015  CLINICAL DATA:  45 year old female with acute headache and cervical spine pain following fall and injury. Initial encounter. EXAM: CT HEAD WITHOUT CONTRAST CT CERVICAL SPINE WITHOUT CONTRAST TECHNIQUE: Multidetector CT imaging of the head and cervical spine was performed following the standard protocol without intravenous contrast. Multiplanar CT image reconstructions of the cervical spine were also generated. COMPARISON:  None. FINDINGS: CT HEAD FINDINGS No intracranial abnormalities are identified, including mass lesion or mass effect, hydrocephalus, extra-axial fluid collection, midline shift, hemorrhage, or acute infarction. The visualized bony calvarium is unremarkable. CT CERVICAL SPINE FINDINGS Normal cervical alignment is noted. There is no evidence of acute fracture, subluxation or prevertebral soft tissue swelling. The disc spaces are maintained. No focal bony lesions are present. The soft tissue structures are unremarkable. IMPRESSION: Unremarkable noncontrast head CT. CT no static evidence of acute injury to the cervical spine. Electronically Signed   By: Harmon PierJeffrey  Hu M.D.   On: 11/12/2015 21:49   Ct Cervical Spine Wo Contrast  11/12/2015  CLINICAL DATA:  45 year old female with acute headache and cervical spine pain following fall and injury. Initial encounter. EXAM: CT HEAD WITHOUT CONTRAST CT CERVICAL  SPINE WITHOUT CONTRAST TECHNIQUE: Multidetector CT imaging of the head and cervical spine was performed following the standard protocol without intravenous contrast. Multiplanar CT image reconstructions of the cervical spine were also generated. COMPARISON:  None. FINDINGS: CT HEAD FINDINGS No intracranial abnormalities are identified, including mass lesion or mass effect, hydrocephalus, extra-axial fluid collection, midline shift, hemorrhage, or acute infarction. The visualized bony calvarium is unremarkable. CT CERVICAL SPINE FINDINGS Normal cervical alignment is noted. There is no evidence of acute fracture, subluxation or prevertebral soft tissue swelling. The disc spaces are maintained. No focal bony lesions are present. The soft tissue structures are unremarkable. IMPRESSION: Unremarkable noncontrast head CT. CT no static evidence of acute injury to the cervical spine. Electronically Signed   By: Harmon PierJeffrey  Hu M.D.   On: 11/12/2015 21:49      MDM   Final diagnoses:  Fall, initial encounter  Head contusion, initial encounter  Neck sprain, initial encounter    Patient is alert and oriented 3 without neurological deficits. CT of head and cervical spine are negative for acute findings. Discussed with patient and her family.   I personally performed the services described in this documentation, which was scribed in my presence. The recorded information has been reviewed and is accurate.    Donnetta HutchingBrian Kamee Bobst, MD 11/12/15 2231

## 2016-03-29 ENCOUNTER — Encounter (HOSPITAL_COMMUNITY): Payer: Self-pay | Admitting: Emergency Medicine

## 2016-03-29 ENCOUNTER — Ambulatory Visit (HOSPITAL_COMMUNITY)
Admission: EM | Admit: 2016-03-29 | Discharge: 2016-03-29 | Disposition: A | Discharge status: Home / Self Care | Payer: BLUE CROSS/BLUE SHIELD | Attending: Family Medicine | Admitting: Family Medicine

## 2016-03-29 DIAGNOSIS — N390 Urinary tract infection, site not specified: Secondary | ICD-10-CM | POA: Diagnosis not present

## 2016-03-29 DIAGNOSIS — N3001 Acute cystitis with hematuria: Secondary | ICD-10-CM | POA: Insufficient documentation

## 2016-03-29 DIAGNOSIS — Z79899 Other long term (current) drug therapy: Secondary | ICD-10-CM | POA: Insufficient documentation

## 2016-03-29 LAB — POCT URINALYSIS DIP (DEVICE)
BILIRUBIN URINE: NEGATIVE
GLUCOSE, UA: NEGATIVE mg/dL
Ketones, ur: NEGATIVE mg/dL
NITRITE: NEGATIVE
Protein, ur: NEGATIVE mg/dL
Specific Gravity, Urine: >=1.03 (ref 1.005–1.030)
UROBILINOGEN UA: 0.2 mg/dL (ref 0.0–1.0)
pH: 5.5 (ref 5.0–8.0)

## 2016-03-29 MED ORDER — NITROFURANTOIN MONOHYD MACRO 100 MG PO CAPS: 100.0000 mg | ORAL_CAPSULE | Freq: Two times a day (BID) | ORAL | 0 refills | Status: AC

## 2016-03-29 MED ORDER — FLUCONAZOLE 150 MG PO TABS: 150.0000 mg | ORAL_TABLET | Freq: Every day | ORAL | 0 refills | Status: AC

## 2016-03-29 NOTE — ED Triage Notes (Signed)
PT reports left sided flank pain and dysuria that started yesterday. PT has had several UTIs in the past

## 2016-03-29 NOTE — ED Provider Notes (Signed)
CSN: [id number]     Arrival date & time 03/29/16  1748 History   First MD Initiated Contact with Patient 03/29/16 1915     Chief Complaint  Patient presents with  . Urinary Tract Infection   (Consider location/radiation/quality/duration/timing/severity/associated sxs/prior Treatment) Patient is a well-appearing 45 y.o female, presents today for UTI. Patient has a long history of UTI in the past. She reports her current episode started yesterday and feels exactly like her previous UTI with dysuria, abdominal pain, flank pain and sometimes nausea. Patient denies vaginal discharge. Patient also denies hx of kidney stones.       Past Medical History:  Diagnosis Date  . Anxiety   . Bad odor of urine 09/09/2015   Past Surgical History:  Procedure Laterality Date  . ABDOMINAL HYSTERECTOMY    . BLADDER SURGERY     Family History  Problem Relation Age of Onset  . Adopted: Yes  . Irritable bowel syndrome Son    Social History  Substance Use Topics  . Smoking status: Never Smoker  . Smokeless tobacco: Never Used  . Alcohol use Yes     Comment: occassionally   OB History    Gravida Para Term Preterm AB Living   2 2       2    SAB TAB Ectopic Multiple Live Births                 Review of Systems  Constitutional: Negative for chills, fatigue and fever.  Respiratory: Negative for shortness of breath.   Cardiovascular: Negative for chest pain.  Gastrointestinal: Positive for abdominal pain and nausea. Negative for diarrhea and vomiting.  Genitourinary: Positive for dysuria, flank pain and urgency. Negative for hematuria and vaginal discharge.  Neurological: Negative for dizziness and headaches.    Allergies  Demerol [meperidine]  Home Medications   Prior to Admission medications   Medication Sig Start Date End Date Taking? Authorizing Provider  busPIRone (BUSPAR) 5 MG tablet TAKE 1 TABLET (5 MG TOTAL) BY MOUTH 3 (THREE) TIMES DAILY. 09/09/15   Adline PotterJennifer A Griffin, NP   fluconazole (DIFLUCAN) 150 MG tablet Take 1 tablet (150 mg total) by mouth daily. 03/29/16 03/30/16  Lucia EstelleFeng Ronnisha Felber, NP  LORazepam (ATIVAN) 0.5 MG tablet Take 1 tablet (0.5 mg total) by mouth 2 (two) times daily as needed. 09/09/15   Adline PotterJennifer A Griffin, NP  nitrofurantoin, macrocrystal-monohydrate, (MACROBID) 100 MG capsule Take 1 capsule (100 mg total) by mouth 2 (two) times daily. 03/29/16 04/03/16  Lucia EstelleFeng Wade Asebedo, NP   Meds Ordered and Administered this Visit  Medications - No data to display  BP 120/66   Pulse (!) 58   Temp 98.1 F (36.7 C) (Oral)   Resp 16   Ht 5\' 10"  (1.778 m)   Wt 160 lb (72.6 kg)   SpO2 100%   BMI 22.96 kg/m  No data found.   Physical Exam  Constitutional: She appears well-developed and well-nourished.  Cardiovascular: Normal rate, regular rhythm and normal heart sounds.   Pulmonary/Chest: Effort normal and breath sounds normal. No respiratory distress. She has no wheezes.  Abdominal: Soft. Bowel sounds are normal. She exhibits no distension. There is tenderness.  Slightly tender to palpate over the LLQ  Genitourinary:  Genitourinary Comments: Positive Left CVA tenderness  Skin: Skin is warm and dry.  Nursing note and vitals reviewed.   Urgent Care Course   Clinical Course     Procedures (including critical care time)  Labs Review Labs Reviewed  POCT URINALYSIS DIP (  DEVICE) - Abnormal; Notable for the following:       Result Value   Hgb urine dipstick SMALL (*)    Leukocytes, UA TRACE (*)    All other components within normal limits  URINE CULTURE    Imaging Review No results found.  MDM   1. Acute cystitis with hematuria    Patient have had multiple UTI in the past and reports her symptoms feels exactly like her previous UTI. Will tx with Macrobid 100 mg BID x 5 days. 1 tab of Diflucan also given. Urine culture obtained and pending. Informed to return for re-evaluation or f/u with PCP if she does not improve.     Lucia EstelleFeng Alexine Pilant, NP 03/29/16  [phone]

## 2016-04-01 LAB — URINE CULTURE: Culture: >=100000 — AB

## 2016-04-04 ENCOUNTER — Telehealth (HOSPITAL_COMMUNITY): Payer: Self-pay | Admitting: Emergency Medicine

## 2016-04-04 DIAGNOSIS — M722 Plantar fascial fibromatosis: Secondary | ICD-10-CM | POA: Insufficient documentation

## 2016-04-04 DIAGNOSIS — M7730 Calcaneal spur, unspecified foot: Secondary | ICD-10-CM | POA: Insufficient documentation

## 2016-04-04 NOTE — Telephone Encounter (Signed)
-----   Message from Eustace MooreLaura W Murray, MD sent at 04/02/2016  8:05 PM EST ----- Please let patient know that urine culture was positive for E coli, [facility]  Finish nitrofurantoin rx.  Recheck for further evaluation if symptoms persist.  LM

## 2016-04-04 NOTE — Telephone Encounter (Signed)
LM on [phone] Called to give lab results and to see how pt is doing from recent visit on 11/29 Notified pt in gen message of abnormal results and that there is NO need to call back Unless not feeling any better, not tolerating meds well or if wanting to know lab results. Also let pt know labs can be obtained from MyChart

## 2016-04-12 DIAGNOSIS — R319 Hematuria, unspecified: Secondary | ICD-10-CM | POA: Diagnosis not present

## 2016-04-12 DIAGNOSIS — N3001 Acute cystitis with hematuria: Secondary | ICD-10-CM | POA: Diagnosis not present

## 2016-04-25 ENCOUNTER — Other Ambulatory Visit: Payer: Self-pay | Admitting: Adult Health

## 2016-05-22 ENCOUNTER — Other Ambulatory Visit: Payer: Self-pay | Admitting: Adult Health

## 2016-08-30 ENCOUNTER — Other Ambulatory Visit: Payer: Self-pay | Admitting: Adult Health

## 2016-08-30 DIAGNOSIS — Z1231 Encounter for screening mammogram for malignant neoplasm of breast: Secondary | ICD-10-CM

## 2016-09-06 ENCOUNTER — Ambulatory Visit (HOSPITAL_COMMUNITY): Payer: Self-pay

## 2016-09-11 ENCOUNTER — Ambulatory Visit (HOSPITAL_COMMUNITY)
Admission: RE | Admit: 2016-09-11 | Discharge: 2016-09-11 | Disposition: A | Discharge status: Home / Self Care | Payer: BLUE CROSS/BLUE SHIELD | Source: Ambulatory Visit | Attending: Adult Health | Admitting: Adult Health

## 2016-09-11 DIAGNOSIS — Z1231 Encounter for screening mammogram for malignant neoplasm of breast: Secondary | ICD-10-CM | POA: Diagnosis not present

## 2016-09-13 ENCOUNTER — Ambulatory Visit (INDEPENDENT_AMBULATORY_CARE_PROVIDER_SITE_OTHER): Payer: BLUE CROSS/BLUE SHIELD | Admitting: Adult Health

## 2016-09-13 ENCOUNTER — Encounter: Payer: Self-pay | Admitting: Adult Health

## 2016-09-13 VITALS — BP 110/70 | HR 80 | Ht 70.0 in | Wt 161.0 lb

## 2016-09-13 DIAGNOSIS — Z1389 Encounter for screening for other disorder: Secondary | ICD-10-CM | POA: Insufficient documentation

## 2016-09-13 DIAGNOSIS — Z1212 Encounter for screening for malignant neoplasm of rectum: Secondary | ICD-10-CM | POA: Diagnosis not present

## 2016-09-13 DIAGNOSIS — Z13220 Encounter for screening for lipoid disorders: Secondary | ICD-10-CM | POA: Diagnosis not present

## 2016-09-13 DIAGNOSIS — Z1329 Encounter for screening for other suspected endocrine disorder: Secondary | ICD-10-CM

## 2016-09-13 DIAGNOSIS — Z1211 Encounter for screening for malignant neoplasm of colon: Secondary | ICD-10-CM | POA: Diagnosis not present

## 2016-09-13 DIAGNOSIS — R5383 Other fatigue: Secondary | ICD-10-CM

## 2016-09-13 DIAGNOSIS — R195 Other fecal abnormalities: Secondary | ICD-10-CM | POA: Insufficient documentation

## 2016-09-13 DIAGNOSIS — Z01419 Encounter for gynecological examination (general) (routine) without abnormal findings: Secondary | ICD-10-CM | POA: Diagnosis not present

## 2016-09-13 DIAGNOSIS — F419 Anxiety disorder, unspecified: Secondary | ICD-10-CM

## 2016-09-13 LAB — HEMOCCULT GUIAC POC 1CARD (OFFICE): FECAL OCCULT BLD: POSITIVE — AB

## 2016-09-13 LAB — POCT URINALYSIS DIPSTICK
Glucose, UA: NEGATIVE
Ketones, UA: NEGATIVE
Leukocytes, UA: NEGATIVE
Nitrite, UA: NEGATIVE
RBC UA: NEGATIVE

## 2016-09-13 NOTE — Progress Notes (Signed)
Patient ID: Julie MacadamMichelle [name], female   DOB: 02/22/1971, 46 y.o.   MRN: [id number] History of Present Illness: Julie DusterMichelle is a 46 year old white female, married, G2P2, sp hysterectomy in for well woman gyn exam. She is good on her Buspar and occasional ativan.    Current Medications, Allergies, Past Medical History, Past Surgical History, Family History and Social History were reviewed in [facility] electronic medical record.     Review of Systems: Patient denies any headaches, hearing loss, blurred vision, shortness of breath, chest pain, abdominal pain, problems with bowel movements(alternates constipation and diarrhea), urination, or intercourse. No joint pain or mood swings.Some low back pain, and is tired but works 3 jobs.   Physical Exam:BP 110/70 (BP Location: Left Arm, Patient Position: Sitting, Cuff Size: Small)   Pulse 80   Ht 5\' 10"  (1.778 m)   Wt 161 lb (73 kg)   BMI 23.10 kg/m  urine trace protein General:  Well developed, well nourished, no acute distress Skin:  Warm and dry Neck:  Midline trachea, normal thyroid, good ROM, no lymphadenopathy Lungs; Clear to auscultation bilaterally Breast:  No dominant palpable mass, retraction, or nipple discharge Cardiovascular: Regular rate and rhythm Abdomen:  Soft, non tender, no hepatosplenomegaly, no CVAT  Pelvic:  External genitalia is normal in appearance, no lesions.  The vagina is normal in appearance. Urethra has no lesions or masses. The cervix and uterus are absent. No adnexal masses or tenderness noted.Bladder is non tender, no masses felt. Rectal: Good sphincter tone, no polyps, or hemorrhoids felt.  Hemoccult positive.. Extremities/musculoskeletal:  No swelling or varicosities noted, no clubbing or cyanosis Psych:  No mood changes, alert and cooperative,seems happy PHQ 2 score 0.  Impression: 1. Well woman exam with routine gynecological exam   2. Anxiety   3. Fecal occult blood test positive   4. Screening for  genitourinary condition   5. Screening for colorectal cancer   6. Screening cholesterol level   7. Screening for thyroid disorder   8. Fatigue, unspecified type       Plan: Check CBC,CMP,TSH and lipids 3 hemoccult cards sent home with her to do and bring back Referred to Dr Jena Gaussourk for colonoscopy(she is adopted and does not know history) Physical in 1 year Mammogram yearly(had this week, was negative)

## 2016-09-14 ENCOUNTER — Telehealth: Payer: Self-pay | Admitting: Adult Health

## 2016-09-14 LAB — COMPREHENSIVE METABOLIC PANEL
ALT: 13 IU/L (ref 0–32)
AST: 13 IU/L (ref 0–40)
Albumin/Globulin Ratio: 1.7 (ref 1.2–2.2)
Albumin: 4.6 g/dL (ref 3.5–5.5)
Alkaline Phosphatase: 62 IU/L (ref 39–117)
BUN/Creatinine Ratio: 14 (ref 9–23)
BUN: 12 mg/dL (ref 6–24)
Bilirubin Total: 0.6 mg/dL (ref 0.0–1.2)
CALCIUM: 9.8 mg/dL (ref 8.7–10.2)
CHLORIDE: 103 mmol/L (ref 96–106)
CO2: 27 mmol/L (ref 18–29)
Creatinine, Ser: 0.87 mg/dL (ref 0.57–1.00)
GFR calc non Af Amer: 80 mL/min/{1.73_m2} (ref >59)
GFR, EST AFRICAN AMERICAN: 92 mL/min/{1.73_m2} (ref >59)
Globulin, Total: 2.7 g/dL (ref 1.5–4.5)
Glucose: 92 mg/dL (ref 65–99)
Potassium: 4.6 mmol/L (ref 3.5–5.2)
Sodium: 144 mmol/L (ref 134–144)
TOTAL PROTEIN: 7.3 g/dL (ref 6.0–8.5)

## 2016-09-14 LAB — CBC
HEMOGLOBIN: 14.5 g/dL (ref 11.1–15.9)
Hematocrit: 44.3 % (ref 34.0–46.6)
MCH: 31.3 pg (ref 26.6–33.0)
MCHC: 32.7 g/dL (ref 31.5–35.7)
MCV: 96 fL (ref 79–97)
PLATELETS: 278 10*3/uL (ref 150–379)
RBC: 4.64 x10E6/uL (ref 3.77–5.28)
RDW: 13.6 % (ref 12.3–15.4)
WBC: 7 10*3/uL (ref 3.4–10.8)

## 2016-09-14 LAB — LIPID PANEL
Chol/HDL Ratio: 3.2 ratio (ref 0.0–4.4)
Cholesterol, Total: 158 mg/dL (ref 100–199)
HDL: 49 mg/dL (ref >39)
LDL Calculated: 91 mg/dL (ref 0–99)
Triglycerides: 89 mg/dL (ref 0–149)
VLDL Cholesterol Cal: 18 mg/dL (ref 5–40)

## 2016-09-14 LAB — TSH: TSH: 1.57 u[IU]/mL (ref 0.450–4.500)

## 2016-09-14 NOTE — Telephone Encounter (Signed)
Patient called with questions regarding referral. Informed patient referral was sent and should get a call from them regarding appointment. Advised to bring back hemoccult cards when she can. Verbalized understanding.

## 2016-09-14 NOTE — Telephone Encounter (Signed)
Pt called stating that she seen Margaretha SheffieldJennifer Yesterday and she has some question for her regarding her visit yesterday. Please contact pt

## 2016-09-18 ENCOUNTER — Telehealth: Payer: Self-pay | Admitting: Adult Health

## 2016-09-18 NOTE — Telephone Encounter (Signed)
Pt aware labs are great  

## 2016-09-21 ENCOUNTER — Encounter: Payer: Self-pay | Admitting: Internal Medicine

## 2016-09-22 ENCOUNTER — Other Ambulatory Visit (INDEPENDENT_AMBULATORY_CARE_PROVIDER_SITE_OTHER): Payer: BLUE CROSS/BLUE SHIELD

## 2016-09-22 DIAGNOSIS — Z1212 Encounter for screening for malignant neoplasm of rectum: Secondary | ICD-10-CM | POA: Diagnosis not present

## 2016-09-22 LAB — HEMOCCULT GUIAC POC 1CARD (OFFICE)
Card #2 Fecal Occult Blod, POC: NEGATIVE
Card #3 Fecal Occult Blood, POC: NEGATIVE
Fecal Occult Blood, POC: NEGATIVE

## 2016-09-22 NOTE — Consult Note (Signed)
Pt brought hemacult cards to office and all 3 were checked and was negative. I left pt a message letting pt know. JSY

## 2016-10-02 ENCOUNTER — Other Ambulatory Visit: Payer: Self-pay | Admitting: Adult Health

## 2016-10-18 ENCOUNTER — Ambulatory Visit: Payer: BLUE CROSS/BLUE SHIELD | Admitting: Nurse Practitioner

## 2016-11-08 ENCOUNTER — Ambulatory Visit: Payer: Self-pay | Admitting: Gastroenterology

## 2016-11-08 ENCOUNTER — Encounter: Payer: Self-pay | Admitting: Gastroenterology

## 2016-11-08 ENCOUNTER — Ambulatory Visit (INDEPENDENT_AMBULATORY_CARE_PROVIDER_SITE_OTHER): Payer: BLUE CROSS/BLUE SHIELD | Admitting: Gastroenterology

## 2016-11-08 VITALS — BP 122/69 | HR 57 | Temp 97.2°F | Ht 70.0 in | Wt 161.4 lb

## 2016-11-08 DIAGNOSIS — R195 Other fecal abnormalities: Secondary | ICD-10-CM | POA: Diagnosis not present

## 2016-11-08 NOTE — Patient Instructions (Signed)
1. Please call when you are ready to schedule your colonoscopy.

## 2016-11-08 NOTE — Progress Notes (Addendum)
REVIEWED-NO ADDITIONAL RECOMMENDATIONS.  Primary Care Physician:  [facility] Referring Provider: Cyril Mourning, NP Primary Gastroenterologist:  Roetta Sessions, MD   Chief Complaint  Patient presents with  . Blood In Stools    HPI:  Julie [name] is a 46 y.o. female here at the request of Cyril Mourning, NP for heme + stools. Heme + on DRE in 08/2016. Subsequent take on hemoccults negative X 3. Hgb normal.   She has no issues with melena, brbpr. Some mild intermittent loose stools or hard stools. Daily BM. Seems to be food related. No n/v. No heartburn, unintentional weight loss or vomiting.   She does have history of N/V with anesthesia, less severe after her hysterectomy at [facility] then with her bladder sling at [facility]. She is unable to tolerate Demerol due to N/V. Typically has taken antiemetics prior to pain medication when occasionally needed in the past.     Current Outpatient Prescriptions  Medication Sig Dispense Refill  . busPIRone (BUSPAR) 5 MG tablet TAKE 1 TABLET BY MOUTH THREE TIMES DAILY 90 tablet 3  . LORazepam (ATIVAN) 0.5 MG tablet TAKE 1 TABLET BY MOUTH TWICE DAILY AS NEEDED 60 tablet 0   No current facility-administered medications for this visit.     Allergies as of 11/08/2016 - Review Complete 11/08/2016  Allergen Reaction Noted  . Demerol [meperidine] Nausea And Vomiting 11/21/2011    Past Medical History:  Diagnosis Date  . Anxiety   . Bad odor of urine 09/09/2015    Past Surgical History:  Procedure Laterality Date  . ABDOMINAL HYSTERECTOMY    . BLADDER SURGERY      Family History  Problem Relation Age of Onset  . Adopted: Yes  . Irritable bowel syndrome Son     Social History   Social History  . Marital status: Legally Separated    Spouse name: N/A  . Number of children: N/A  . Years of education: N/A   Occupational History  . Not on file.   Social History Main Topics  . Smoking status: Never Smoker   . Smokeless tobacco: Never Used  . Alcohol use Yes     Comment: occassionally  . Drug use: No  . Sexual activity: Yes    Birth control/ protection: Surgical     Comment: hyst   Other Topics Concern  . Not on file   Social History Narrative  . No narrative on file      ROS:  General: Negative for anorexia, weight loss, fever, chills, fatigue, weakness. Eyes: Negative for vision changes.  ENT: Negative for hoarseness, difficulty swallowing , nasal congestion. CV: Negative for chest pain, angina, palpitations, dyspnea on exertion, peripheral edema.  Respiratory: Negative for dyspnea at rest, dyspnea on exertion, cough, sputum, wheezing.  GI: See history of present illness. GU:  Negative for dysuria, hematuria, urinary incontinence, urinary frequency, nocturnal urination.  MS: Negative for joint pain, low back pain.  Derm: Negative for rash or itching.  Neuro: Negative for weakness, abnormal sensation, seizure, frequent headaches, memory loss, confusion.  Psych: Negative for anxiety, depression, suicidal ideation, hallucinations.  Endo: Negative for unusual weight change.  Heme: Negative for bruising or bleeding. Allergy: Negative for rash or hives.    Physical Examination:  BP 122/69   Pulse (!) 57   Temp (!) 97.2 F (36.2 C) (Oral)   Ht 5\' 10"  (1.778 m)   Wt 161 lb 6.4 oz (73.2 kg)   BMI 23.16 kg/m    General:  Well-nourished, well-developed in no acute distress.  Head: Normocephalic, atraumatic.   Eyes: Conjunctiva pink, no icterus. Mouth: Oropharyngeal mucosa moist and pink , no lesions erythema or exudate. Neck: Supple without thyromegaly, masses, or lymphadenopathy.  Lungs: Clear to auscultation bilaterally.  Heart: Regular rate and rhythm, no murmurs rubs or gallops.  Abdomen: Bowel sounds are normal, nontender, nondistended, no hepatosplenomegaly or masses, no abdominal bruits or    hernia , no rebound or guarding.   Rectal: not performed Extremities: No  lower extremity edema. No clubbing or deformities.  Neuro: Alert and oriented x 4 , grossly normal neurologically.  Skin: Warm and dry, no rash or jaundice.   Psych: Alert and cooperative, normal mood and affect.  Labs: Lab Results  Component Value Date   WBC 7.0 09/13/2016   HGB 14.5 09/13/2016   HCT 44.3 09/13/2016   MCV 96 09/13/2016   PLT 278 09/13/2016   Lab Results  Component Value Date   CREATININE 0.87 09/13/2016   BUN 12 09/13/2016   NA 144 09/13/2016   K 4.6 09/13/2016   CL 103 09/13/2016   CO2 27 09/13/2016   Lab Results  Component Value Date   ALT 13 09/13/2016   AST 13 09/13/2016   ALKPHOS 62 09/13/2016   BILITOT 0.6 09/13/2016   Lab Results  Component Value Date   TSH 1.570 09/13/2016     Imaging Studies: No results found.

## 2016-11-08 NOTE — Assessment & Plan Note (Signed)
46 year old female with recent heme positive stool as outlined above. She does not know her family history, she is adopted. No prior colonoscopy. Clinically doing well. Would advise diagnostic colonoscopy for further evaluation. We discussed anesthesia concerns. She describes significant vomiting post anesthesia in the past. Significant vomiting related to Demerol and other pain medication. Given her anxiety and medications, would advise deep sedation in the OR. Recommended she discuss N/V concerns with anesthesia at time of pre-op.   Patient will call to schedule colonoscopy after she verifies her benefits.  I have discussed the risks, alternatives, benefits with regards to but not limited to the risk of reaction to medication, bleeding, infection, perforation and the patient is agreeable to proceed. Written consent to be obtained.

## 2016-11-09 NOTE — Progress Notes (Signed)
cc'ed to pcp °

## 2016-11-20 ENCOUNTER — Telehealth: Payer: Self-pay

## 2016-11-20 NOTE — Telephone Encounter (Signed)
This would need to be address by CM with the docs' input. Will forward to CM.

## 2016-11-20 NOTE — Telephone Encounter (Signed)
REVIEWED. PT CAN SWITCH BUT IF SHE IS HAVING HEME POSITIVE STOOL THEN SHE SHOULD HAVE TCS PRIOR TO SEP. OUR FIRST AVAILABLE SLOT IS JUL 31 W/ PROPOFOL.

## 2016-11-20 NOTE — Telephone Encounter (Signed)
Pt called back to schedule TCS but she is wanting Sept 17th. She is asking if she can change to [facility] since [facility] is off that day. Please advise

## 2016-11-20 NOTE — Telephone Encounter (Signed)
Routing to [facility] for approval to take on this new patient

## 2016-11-21 NOTE — Telephone Encounter (Signed)
I have an opening on SEP 18.

## 2016-11-21 NOTE — Telephone Encounter (Signed)
Ginger, please let the patient know what [facility]'s recommendations.

## 2016-11-21 NOTE — Telephone Encounter (Signed)
[facility] has openings on September 17th, please schedule the patient there

## 2016-11-21 NOTE — Telephone Encounter (Signed)
Verlon AuLeslie last saw patient. On Buspar and Ativan. Significant anxiety history. Vomiting with Demerol historically. Recommend staying with Propofol.

## 2016-11-21 NOTE — Telephone Encounter (Signed)
Pt is now asking if she can be done with just phenergan and not with propofol. She has been talking to her family and they told her not to have the propofol. Please advise

## 2016-11-21 NOTE — Telephone Encounter (Signed)
REVIEWED-NO ADDITIONAL RECOMMENDATIONS. 

## 2016-11-21 NOTE — Telephone Encounter (Signed)
Routing to Ginger.  

## 2016-11-21 NOTE — Telephone Encounter (Signed)
Pt can not did the TCS until Sept due to having no ride and then she can only do it on Sept 17th.

## 2016-11-22 NOTE — Telephone Encounter (Signed)
I explained to the patient that it has been recommended by Dr. Darrick PennaFields and other providers that the patient will need to be done with Propofol.  She thanked me for calling and stated she needed a few days to think about this and she will give me a call next week with her decision on wether or not she wanted to proceed with the procedure.

## 2016-11-22 NOTE — Telephone Encounter (Signed)
Pt is not wanting to have the TCS done with the PROPOFOL. Please advise

## 2016-11-22 NOTE — Telephone Encounter (Signed)
I have never personally seen the patient; she was seen by another provider in the office and felt that Propofol was best. I have recommended this as well. Dr. Darrick PennaFields has reviewed as you can see from documentation. At this point, I don't have any other recommendations than proceeding with Propofol. Copying office manager for final say on if she needs an appt again in the office to discuss her concerns in more detail.

## 2016-11-23 NOTE — Telephone Encounter (Signed)
Documentation noted. This patient had significant concerns regarding her h/o n/v with anesthesia, high anxiety level, antidepressant/anxiolytic use. I recommended propofol due to this as well as ability of the anesthesiology to better determine what she should tolerate based on her prior anesthesia history.

## 2016-11-27 ENCOUNTER — Other Ambulatory Visit: Payer: Self-pay

## 2016-11-27 DIAGNOSIS — R195 Other fecal abnormalities: Secondary | ICD-10-CM

## 2016-11-27 MED ORDER — CLENPIQ 10-3.5-12 MG-GM -GM/160ML PO SOLN: 1.0000 | Freq: Once | ORAL | 0 refills | Status: AC

## 2016-11-27 NOTE — Telephone Encounter (Signed)
Pt is set up for TCS on 01/16/17 @ 7:30 am. She is aware and instructions are going out in the mail.

## 2016-11-27 NOTE — Telephone Encounter (Signed)
Ginger, please call the patient at [phone], she's ready to schedule her tcs

## 2016-11-27 NOTE — Telephone Encounter (Signed)
NO PA is needed for TCS 

## 2016-12-28 ENCOUNTER — Other Ambulatory Visit: Payer: Self-pay | Admitting: Adult Health

## 2017-01-11 ENCOUNTER — Encounter (HOSPITAL_COMMUNITY): Payer: Self-pay

## 2017-01-11 ENCOUNTER — Encounter (HOSPITAL_COMMUNITY)
Admission: RE | Admit: 2017-01-11 | Discharge: 2017-01-11 | Disposition: A | Discharge status: Home / Self Care | Payer: BLUE CROSS/BLUE SHIELD | Source: Ambulatory Visit | Attending: Gastroenterology | Admitting: Gastroenterology

## 2017-01-11 ENCOUNTER — Other Ambulatory Visit (HOSPITAL_COMMUNITY): Payer: Self-pay

## 2017-01-11 HISTORY — DX: Unspecified osteoarthritis, unspecified site: M19.90

## 2017-01-11 HISTORY — DX: Other complications of anesthesia, initial encounter: T88.59XA

## 2017-01-11 HISTORY — DX: Adverse effect of unspecified anesthetic, initial encounter: T41.45XA

## 2017-01-11 HISTORY — DX: Other specified postprocedural states: Z98.890

## 2017-01-11 HISTORY — DX: Nausea with vomiting, unspecified: R11.2

## 2017-01-16 ENCOUNTER — Ambulatory Visit (HOSPITAL_COMMUNITY)
Admission: RE | Admit: 2017-01-16 | Discharge: 2017-01-16 | Disposition: A | Discharge status: Home / Self Care | Payer: BLUE CROSS/BLUE SHIELD | Source: Ambulatory Visit | Attending: Gastroenterology | Admitting: Gastroenterology

## 2017-01-16 ENCOUNTER — Encounter (HOSPITAL_COMMUNITY)
Admission: RE | Disposition: A | Discharge status: Home / Self Care | Payer: Self-pay | Source: Ambulatory Visit | Attending: Gastroenterology

## 2017-01-16 ENCOUNTER — Encounter (HOSPITAL_COMMUNITY): Payer: Self-pay | Admitting: Anesthesiology

## 2017-01-16 ENCOUNTER — Ambulatory Visit (HOSPITAL_COMMUNITY): Payer: BLUE CROSS/BLUE SHIELD | Admitting: Anesthesiology

## 2017-01-16 DIAGNOSIS — Z90710 Acquired absence of both cervix and uterus: Secondary | ICD-10-CM | POA: Diagnosis not present

## 2017-01-16 DIAGNOSIS — Z79899 Other long term (current) drug therapy: Secondary | ICD-10-CM | POA: Insufficient documentation

## 2017-01-16 DIAGNOSIS — R195 Other fecal abnormalities: Secondary | ICD-10-CM

## 2017-01-16 DIAGNOSIS — F419 Anxiety disorder, unspecified: Secondary | ICD-10-CM | POA: Insufficient documentation

## 2017-01-16 DIAGNOSIS — Q438 Other specified congenital malformations of intestine: Secondary | ICD-10-CM | POA: Insufficient documentation

## 2017-01-16 DIAGNOSIS — K648 Other hemorrhoids: Secondary | ICD-10-CM

## 2017-01-16 DIAGNOSIS — Z885 Allergy status to narcotic agent status: Secondary | ICD-10-CM | POA: Diagnosis not present

## 2017-01-16 DIAGNOSIS — M19041 Primary osteoarthritis, right hand: Secondary | ICD-10-CM | POA: Insufficient documentation

## 2017-01-16 DIAGNOSIS — K921 Melena: Secondary | ICD-10-CM | POA: Diagnosis not present

## 2017-01-16 HISTORY — PX: COLONOSCOPY WITH PROPOFOL: SHX5780

## 2017-01-16 SURGERY — COLONOSCOPY WITH PROPOFOL
Anesthesia: Monitor Anesthesia Care

## 2017-01-16 MED ORDER — PROPOFOL 10 MG/ML IV BOLUS
INTRAVENOUS | Status: AC
  Filled 2017-01-16: qty 40

## 2017-01-16 MED ORDER — PROPOFOL 10 MG/ML IV BOLUS
INTRAVENOUS | Status: AC
  Filled 2017-01-16: qty 20

## 2017-01-16 MED ORDER — MIDAZOLAM HCL 2 MG/2ML IJ SOLN
1.0000 mg | INTRAMUSCULAR | Status: AC
  Administered 2017-01-16: 2 mg via INTRAVENOUS
  Filled 2017-01-16: qty 2

## 2017-01-16 MED ORDER — ONDANSETRON HCL 4 MG/2ML IJ SOLN
4.0000 mg | Freq: Once | INTRAMUSCULAR | Status: AC
  Administered 2017-01-16: 4 mg via INTRAVENOUS

## 2017-01-16 MED ORDER — SUCCINYLCHOLINE CHLORIDE 20 MG/ML IJ SOLN
INTRAMUSCULAR | Status: AC
  Filled 2017-01-16: qty 1

## 2017-01-16 MED ORDER — PROPOFOL 10 MG/ML IV BOLUS
INTRAVENOUS | Status: AC
  Filled 2017-01-16: qty 60

## 2017-01-16 MED ORDER — DEXAMETHASONE SODIUM PHOSPHATE 4 MG/ML IJ SOLN
4.0000 mg | INTRAMUSCULAR | Status: AC
  Administered 2017-01-16: 4 mg via INTRAVENOUS
  Filled 2017-01-16: qty 1

## 2017-01-16 MED ORDER — ONDANSETRON HCL 4 MG/2ML IJ SOLN
INTRAMUSCULAR | Status: AC
  Filled 2017-01-16: qty 2

## 2017-01-16 MED ORDER — FENTANYL CITRATE (PF) 100 MCG/2ML IJ SOLN
25.0000 ug | Freq: Once | INTRAMUSCULAR | Status: AC
  Administered 2017-01-16: 25 ug via INTRAVENOUS

## 2017-01-16 MED ORDER — PROPOFOL 500 MG/50ML IV EMUL
INTRAVENOUS | Status: DC | PRN
  Administered 2017-01-16: 08:00:00 via INTRAVENOUS
  Administered 2017-01-16: 150 ug/kg/min via INTRAVENOUS

## 2017-01-16 MED ORDER — LACTATED RINGERS IV SOLN
INTRAVENOUS | Status: DC
  Administered 2017-01-16: 07:00:00 via INTRAVENOUS

## 2017-01-16 MED ORDER — FENTANYL CITRATE (PF) 100 MCG/2ML IJ SOLN
INTRAMUSCULAR | Status: AC
  Filled 2017-01-16: qty 2

## 2017-01-16 MED ORDER — LIDOCAINE HCL (PF) 1 % IJ SOLN
INTRAMUSCULAR | Status: AC
  Filled 2017-01-16: qty 5

## 2017-01-16 MED ORDER — LIDOCAINE HCL (PF) 0.5 % IJ SOLN
INTRAMUSCULAR | Status: AC
  Filled 2017-01-16: qty 50

## 2017-01-16 MED ORDER — MIDAZOLAM HCL 2 MG/2ML IJ SOLN
INTRAMUSCULAR | Status: AC
  Filled 2017-01-16: qty 2

## 2017-01-16 NOTE — Op Note (Signed)
[facility] Patient Name: Julie [name] Procedure Date: 01/16/2017 7:07 AM MRN: [id number] Date of Birth: 08-12-1970 Attending MD: Barney Drain MD, MD CSN: [id number] Age: 46 Admit Type: Outpatient Procedure:                Colonoscopy, DIAGNOSTIC Indications:              Heme positive stool Providers:                Barney Drain MD, MD, Lurline Del, RN, Aram Candela Referring MD:              Medicines:                Propofol per Anesthesia Complications:            No immediate complications. Estimated Blood Loss:     Estimated blood loss: none. Procedure:                Pre-Anesthesia Assessment:                           - Prior to the procedure, a History and Physical                            was performed, and patient medications and                            allergies were reviewed. The patient's tolerance of                            previous anesthesia was also reviewed. The risks                            and benefits of the procedure and the sedation                            options and risks were discussed with the patient.                            All questions were answered, and informed consent                            was obtained. Prior Anticoagulants: The patient has                            taken aspirin, last dose was 1 day prior to                            procedure. ASA Grade Assessment: I - A normal,                            healthy patient. After reviewing the risks and                            benefits, the patient was deemed in satisfactory  condition to undergo the procedure. After obtaining                            informed consent, the colonoscope was passed under                            direct vision. Throughout the procedure, the                            patient's blood pressure, pulse, and oxygen                            saturations were monitored continuously. The    EC-[id number] ([id number]) scope was introduced through                            the anus and advanced to the 3 cm into the ileum.                            The colonoscopy was somewhat difficult due to a                            tortuous colon. Successful completion of the                            procedure was aided by COLOWRAP. The patient                            tolerated the procedure fairly well. The quality of                            the bowel preparation was excellent. The terminal                            ileum, ileocecal valve, appendiceal orifice, and                            rectum were photographed. Scope In: 7:49:45 AM Scope Out: 8:03:55 AM Scope Withdrawal Time: 0 hours 11 minutes 21 seconds  Total Procedure Duration: 0 hours 14 minutes 10 seconds  Findings:      The terminal ileum appeared normal.      The recto-sigmoid colon and sigmoid colon were moderately redundant.      The exam was otherwise without abnormality.      Non-bleeding internal hemorrhoids were found during retroflexion. The       hemorrhoids were small. Impression:               - Redundant LEFT colon.                           - HEME POSITIVE STOOLS DUE TO internal hemorrhoids. Moderate Sedation:      Per Anesthesia Care Recommendation:           - Repeat colonoscopy in 10 years for surveillance  WITH MAC.                           - High fiber diet.                           - Continue present medications.                           - Patient has a contact number available for                            emergencies. The signs and symptoms of potential                            delayed complications were discussed with the                            patient. Return to normal activities tomorrow.                            Written discharge instructions were provided to the                            patient. Procedure Code(s):        --- Professional ---                            [phone], Colonoscopy, flexible; diagnostic, including                            collection of specimen(s) by brushing or washing,                            when performed (separate procedure) Diagnosis Code(s):        --- Professional ---                           K64.8, Other hemorrhoids                           R19.5, Other fecal abnormalities                           Q43.8, Other specified congenital malformations of                            intestine CPT copyright 2016 American Medical Association. All rights reserved. The codes documented in this report are preliminary and upon coder review may  be revised to meet current compliance requirements. Barney Drain, MD Barney Drain MD, MD 01/16/2017 8:13:43 AM This report has been signed electronically. Number of Addenda: 0

## 2017-01-16 NOTE — Anesthesia Procedure Notes (Signed)
Procedure Name: MAC Date/Time: 01/16/2017 7:34 AM Performed by: Andree Elk, AMY A Pre-anesthesia Checklist: Patient identified, Emergency Drugs available, Suction available, Patient being monitored and Timeout performed Oxygen Delivery Method: Simple face mask

## 2017-01-16 NOTE — Anesthesia Preprocedure Evaluation (Signed)
Anesthesia Evaluation  Patient identified by MRN, date of birth, ID band Patient awake    Reviewed: Allergy & Precautions, NPO status , Patient's Chart, lab work & pertinent test results  History of Anesthesia Complications (+) PONV and history of anesthetic complications  Airway Mallampati: I  TM Distance: >3 FB     Dental  (+) Teeth Intact   Pulmonary neg pulmonary ROS,    breath sounds clear to auscultation       Cardiovascular negative cardio ROS   Rhythm:Regular Rate:Normal     Neuro/Psych PSYCHIATRIC DISORDERS Anxiety    GI/Hepatic Neg liver ROS, neg GERD  ,  Endo/Other  negative endocrine ROS  Renal/GU negative Renal ROS     Musculoskeletal  (+) Arthritis ,   Abdominal   Peds  Hematology   Anesthesia Other Findings   Reproductive/Obstetrics                             Anesthesia Physical Anesthesia Plan  ASA: II  Anesthesia Plan: MAC   Post-op Pain Management:    Induction: Intravenous  PONV Risk Score and Plan:   Airway Management Planned: Simple Face Mask  Additional Equipment:   Intra-op Plan:   Post-operative Plan:   Informed Consent: I have reviewed the patients History and Physical, chart, labs and discussed the procedure including the risks, benefits and alternatives for the proposed anesthesia with the patient or authorized representative who has indicated his/her understanding and acceptance.     Plan Discussed with:   Anesthesia Plan Comments:         Anesthesia Quick Evaluation

## 2017-01-16 NOTE — H&P (Signed)
Primary Care Physician:  [facility] Primary Gastroenterologist:  Dr. Darrick Penna  Pre-Procedure History & Physical: HPI:  Julie [name] is a 46 y.o. female here for HEME POSITIVE STOOLS.  Past Medical History:  Diagnosis Date  . Anxiety   . Arthritis    Right hand ring finger  . Bad odor of urine 09/09/2015  . Complication of anesthesia   . PONV (postoperative nausea and vomiting)     Past Surgical History:  Procedure Laterality Date  . ABDOMINAL HYSTERECTOMY    . BLADDER SURGERY    . CLOSED REDUCTION FINGER WITH PERCUTANEOUS PINNING Right    Ring finger    Prior to Admission medications   Medication Sig Start Date End Date Taking? Authorizing Provider  aspirin-acetaminophen-caffeine (EXCEDRIN MIGRAINE) [phone] MG tablet Take 1-2 tablets by mouth every 6 (six) hours as needed for headache.   Yes [provider]  busPIRone (BUSPAR) 5 MG tablet TAKE 1 TABLET BY MOUTH THREE TIMES DAILY Patient taking differently: TAKE 1 TABLET BY MOUTH UP TO THREE TIMES DAILY FOR ANXIETY (TYPICALLY TWICE DAILY) 12/28/16  Yes Cyril Mourning A, NP  CLENPIQ 10-3.5-12 MG-GM -GM/160ML SOLN Take 320 mLs by mouth once. 11/28/16  Yes [provider]  LORazepam (ATIVAN) 0.5 MG tablet TAKE 1 TABLET BY MOUTH TWICE DAILY AS NEEDED Patient taking differently: TAKE 1 TABLET BY MOUTH TWICE DAILY AS NEEDED FOR ANXIETY. 10/02/16  Yes Adline Potter, NP    Allergies as of 11/27/2016 - Review Complete 11/08/2016  Allergen Reaction Noted  . Demerol [meperidine] Nausea And Vomiting 11/21/2011    Family History  Problem Relation Age of Onset  . Adopted: Yes  . Irritable bowel syndrome Son     Social History   Social History  . Marital status: Legally Separated    Spouse name: N/A  . Number of children: N/A  . Years of education: N/A   Occupational History  . Not on file.   Social History Main Topics  . Smoking status: Never Smoker  . Smokeless tobacco:  Never Used  . Alcohol use Yes     Comment: occassionally  . Drug use: No  . Sexual activity: Yes    Birth control/ protection: Surgical     Comment: hyst   Other Topics Concern  . Not on file   Social History Narrative  . No narrative on file    Review of Systems: See HPI, otherwise negative ROS   Physical Exam: BP 115/64   Pulse 64   Temp 97.9 F (36.6 C) (Oral)   Ht  (1.778 m)   Wt 161 lb (73 kg)   SpO2 99%   BMI 23.10 kg/m  General:   Alert,  pleasant and cooperative in NAD Head:  Normocephalic and atraumatic. Neck:  Supple; Lungs:  Clear throughout to auscultation.    Heart:  Regular rate and rhythm. Abdomen:  Soft, nontender and nondistended. Normal bowel sounds, without guarding, and without rebound.   Neurologic:  Alert and  oriented x4;  grossly normal neurologically.  Impression/Plan:    HEME POS STOOLS  PLAN:  1.TCS TODAY. DISCUSSED PROCEDURE, BENEFITS, & RISKS: < 1% chance of medication reaction, bleeding, perforation, or rupture of spleen/liver.

## 2017-01-16 NOTE — Transfer of Care (Signed)
Immediate Anesthesia Transfer of Care Note  Patient: Daniya Aramburo Furber  Procedure(s) Performed: Procedure(s) with comments: COLONOSCOPY WITH PROPOFOL (N/A) - 730   Patient Location: PACU  Anesthesia Type:MAC  Level of Consciousness: awake, alert , oriented and patient cooperative  Airway & Oxygen Therapy: Patient Spontanous Breathing and Patient connected to nasal cannula oxygen  Post-op Assessment: Report given to RN and Post -op Vital signs reviewed and stable  Post vital signs: Reviewed and stable  Last Vitals:  Vitals:   01/16/17 0639  BP: 115/64  Pulse: 64  Temp: 36.6 C  SpO2: 99%    Last Pain:  Vitals:   01/16/17 0639  TempSrc: Oral      Patients Stated Pain Goal: 3 (40/37/54 3606)  Complications: No apparent anesthesia complications

## 2017-01-16 NOTE — Discharge Instructions (Signed)
You have small internal hemorrhoids, WHICH IS THE MOST LIKELY CAUSE FOR BLOOD DETECTED IN YOUR STOOL. YOU DID NOT HAVE ANY POLYPS. THE LAST PART OF YOUR SMALL BOWEL IS NORMAL.    DRINK WATER TO KEEP YOUR URINE LIGHT YELLOW.  FOLLOW A HIGH FIBER DIET. AVOID ITEMS THAT CAUSE BLOATING. SEE INFO BELOW.  USE PREPARATION H FOUR TIMES  A DAY IF NEEDED TO RELIEVE RECTAL PAIN/PRESSURE/BLEEDING.  Next colonoscopy in 10 years.  Colonoscopy Care After Read the instructions outlined below and refer to this sheet in the next week. These discharge instructions provide you with general information on caring for yourself after you leave the hospital. While your treatment has been planned according to the most current medical practices available, unavoidable complications occasionally occur. If you have any problems or questions after discharge, call DR. Makhya Arave, [phone].  ACTIVITY  You may resume your regular activity, but move at a slower pace for the next 24 hours.   Take frequent rest periods for the next 24 hours.   Walking will help get rid of the air and reduce the bloated feeling in your belly (abdomen).   No driving for 24 hours (because of the medicine (anesthesia) used during the test).   You may shower.   Do not sign any important legal documents or operate any machinery for 24 hours (because of the anesthesia used during the test).    NUTRITION  Drink plenty of fluids.   You may resume your normal diet as instructed by your doctor.   Begin with a light meal and progress to your normal diet. Heavy or fried foods are harder to digest and may make you feel sick to your stomach (nauseated).   Avoid alcoholic beverages for 24 hours or as instructed.    MEDICATIONS  You may resume your normal medications.   WHAT YOU CAN EXPECT TODAY  Some feelings of bloating in the abdomen.   Passage of more gas than usual.   Spotting of blood in your stool or on the toilet paper  .    IF YOU HAD POLYPS REMOVED DURING THE COLONOSCOPY:  Eat a soft diet IF YOU HAVE NAUSEA, BLOATING, ABDOMINAL PAIN, OR VOMITING.    FINDING OUT THE RESULTS OF YOUR TEST Not all test results are available during your visit. DR. Darrick Penna WILL CALL YOU WITHIN 14 DAYS OF YOUR PROCEDUE WITH YOUR RESULTS. Do not assume everything is normal if you have not heard from DR. Talen Poser, CALL HER OFFICE AT [phone].  SEEK IMMEDIATE MEDICAL ATTENTION AND CALL THE OFFICE: [phone] IF:  You have more than a spotting of blood in your stool.   Your belly is swollen (abdominal distention).   You are nauseated or vomiting.   You have a temperature over 101F.   You have abdominal pain or discomfort that is severe or gets worse throughout the day.  High-Fiber Diet A high-fiber diet changes your normal diet to include more whole grains, legumes, fruits, and vegetables. Changes in the diet involve replacing refined carbohydrates with unrefined foods. The calorie level of the diet is essentially unchanged. The Dietary Reference Intake (recommended amount) for adult males is 38 grams per day. For adult females, it is 25 grams per day. Pregnant and lactating women should consume 28 grams of fiber per day. Fiber is the intact part of a plant that is not broken down during digestion. Functional fiber is fiber that has been isolated from the plant to provide a beneficial effect in the body.  PURPOSE  Increase stool bulk.   Ease and regulate bowel movements.   Lower cholesterol.   REDUCE RISK OF COLON CANCER  INDICATIONS THAT YOU NEED MORE FIBER  Constipation and hemorrhoids.   Uncomplicated diverticulosis (intestine condition) and irritable bowel syndrome.   Weight management.   As a protective measure against hardening of the arteries (atherosclerosis), diabetes, and cancer.   GUIDELINES FOR INCREASING FIBER IN THE DIET  Start adding fiber to the diet slowly. A gradual increase of about 5 more grams  (2 slices of whole-wheat bread, 2 servings of most fruits or vegetables, or 1 bowl of high-fiber cereal) per day is best. Too rapid an increase in fiber may result in constipation, flatulence, and bloating.   Drink enough water and fluids to keep your urine clear or pale yellow. Water, juice, or caffeine-free drinks are recommended. Not drinking enough fluid may cause constipation.   Eat a variety of high-fiber foods rather than one type of fiber.   Try to increase your intake of fiber through using high-fiber foods rather than fiber pills or supplements that contain small amounts of fiber.   The goal is to change the types of food eaten. Do not supplement your present diet with high-fiber foods, but replace foods in your present diet.    INCLUDE A VARIETY OF FIBER SOURCES  Replace refined and processed grains with whole grains, canned fruits with fresh fruits, and incorporate other fiber sources. White rice, white breads, and most bakery goods contain little or no fiber.   Brown whole-grain rice, buckwheat oats, and many fruits and vegetables are all good sources of fiber. These include: broccoli, Brussels sprouts, cabbage, cauliflower, beets, sweet potatoes, white potatoes (skin on), carrots, tomatoes, eggplant, squash, berries, fresh fruits, and dried fruits.   Cereals appear to be the richest source of fiber. Cereal fiber is found in whole grains and bran. Bran is the fiber-rich outer coat of cereal grain, which is largely removed in refining. In whole-grain cereals, the bran remains. In breakfast cereals, the largest amount of fiber is found in those with "bran" in their names. The fiber content is sometimes indicated on the label.   You may need to include additional fruits and vegetables each day.   In baking, for 1 cup white flour, you may use the following substitutions:   1 cup whole-wheat flour minus 2 tablespoons.   1/2 cup white flour plus 1/2 cup whole-wheat flour.    Hemorrhoids Hemorrhoids are dilated (enlarged) veins around the rectum. Sometimes clots will form in the veins. This makes them swollen and painful. These are called thrombosed hemorrhoids. Causes of hemorrhoids include:  Constipation.   Straining to have a bowel movement.   HEAVY LIFTING  HOME CARE INSTRUCTIONS  Eat a well balanced diet and drink 6 to 8 glasses of water every day to avoid constipation. You may also use a bulk laxative.   Avoid straining to have bowel movements.   Keep anal area dry and clean.   Do not use a donut shaped pillow or sit on the toilet for long periods. This increases blood pooling and pain.   Move your bowels when your body has the urge; this will require less straining and will decrease pain and pressure.

## 2017-01-16 NOTE — Anesthesia Postprocedure Evaluation (Signed)
Anesthesia Post Note  Patient: Julie [name]  Procedure(s) Performed: Procedure(s) (LRB): COLONOSCOPY WITH PROPOFOL (N/A)  Patient location during evaluation: PACU Anesthesia Type: MAC Level of consciousness: awake and alert, oriented and patient cooperative Pain management: pain level controlled Vital Signs Assessment: post-procedure vital signs reviewed and stable Respiratory status: spontaneous breathing Cardiovascular status: stable Postop Assessment: no apparent nausea or vomiting Anesthetic complications: no     Last Vitals:  Vitals:   01/16/17 0639  BP: 115/64  Pulse: 64  Temp: 36.6 C  SpO2: 99%    Last Pain:  Vitals:   01/16/17 0639  TempSrc: Oral                 ADAMS, AMY A

## 2017-01-18 ENCOUNTER — Encounter (HOSPITAL_COMMUNITY): Payer: Self-pay | Admitting: Gastroenterology

## 2017-02-12 ENCOUNTER — Other Ambulatory Visit: Payer: Self-pay | Admitting: Adult Health

## 2017-05-18 ENCOUNTER — Emergency Department (HOSPITAL_COMMUNITY)
Admission: EM | Admit: 2017-05-18 | Discharge: 2017-05-18 | Disposition: A | Discharge status: Home / Self Care | Payer: BLUE CROSS/BLUE SHIELD | Attending: Emergency Medicine | Admitting: Emergency Medicine

## 2017-05-18 ENCOUNTER — Ambulatory Visit (HOSPITAL_COMMUNITY): Payer: Self-pay

## 2017-05-18 ENCOUNTER — Other Ambulatory Visit: Payer: Self-pay

## 2017-05-18 ENCOUNTER — Emergency Department (HOSPITAL_COMMUNITY): Payer: BLUE CROSS/BLUE SHIELD

## 2017-05-18 ENCOUNTER — Encounter (HOSPITAL_COMMUNITY): Payer: Self-pay | Admitting: Emergency Medicine

## 2017-05-18 DIAGNOSIS — Z79899 Other long term (current) drug therapy: Secondary | ICD-10-CM | POA: Diagnosis not present

## 2017-05-18 DIAGNOSIS — R1084 Generalized abdominal pain: Secondary | ICD-10-CM | POA: Insufficient documentation

## 2017-05-18 DIAGNOSIS — R51 Headache: Secondary | ICD-10-CM | POA: Diagnosis not present

## 2017-05-18 DIAGNOSIS — R1010 Upper abdominal pain, unspecified: Secondary | ICD-10-CM | POA: Diagnosis not present

## 2017-05-18 DIAGNOSIS — G8929 Other chronic pain: Secondary | ICD-10-CM

## 2017-05-18 DIAGNOSIS — R0602 Shortness of breath: Secondary | ICD-10-CM | POA: Diagnosis not present

## 2017-05-18 DIAGNOSIS — R0789 Other chest pain: Secondary | ICD-10-CM | POA: Insufficient documentation

## 2017-05-18 DIAGNOSIS — R079 Chest pain, unspecified: Secondary | ICD-10-CM | POA: Diagnosis not present

## 2017-05-18 DIAGNOSIS — R519 Headache, unspecified: Secondary | ICD-10-CM

## 2017-05-18 DIAGNOSIS — R109 Unspecified abdominal pain: Secondary | ICD-10-CM

## 2017-05-18 HISTORY — DX: Headache, unspecified: R51.9

## 2017-05-18 HISTORY — DX: Headache: R51

## 2017-05-18 LAB — URINALYSIS, ROUTINE W REFLEX MICROSCOPIC
Bilirubin Urine: NEGATIVE
Glucose, UA: NEGATIVE mg/dL
Ketones, ur: NEGATIVE mg/dL
NITRITE: NEGATIVE
PROTEIN: NEGATIVE mg/dL
Specific Gravity, Urine: 1.019 (ref 1.005–1.030)
pH: 5 (ref 5.0–8.0)

## 2017-05-18 LAB — CBC
HEMATOCRIT: 45.6 % (ref 36.0–46.0)
HEMOGLOBIN: 15 g/dL (ref 12.0–15.0)
MCH: 31 pg (ref 26.0–34.0)
MCHC: 32.9 g/dL (ref 30.0–36.0)
MCV: 94.2 fL (ref 78.0–100.0)
Platelets: 275 10*3/uL (ref 150–400)
RBC: 4.84 MIL/uL (ref 3.87–5.11)
RDW: 12.4 % (ref 11.5–15.5)
WBC: 8.4 10*3/uL (ref 4.0–10.5)

## 2017-05-18 LAB — BASIC METABOLIC PANEL
ANION GAP: 11 (ref 5–15)
BUN: 17 mg/dL (ref 6–20)
CALCIUM: 10 mg/dL (ref 8.9–10.3)
CO2: 26 mmol/L (ref 22–32)
Chloride: 107 mmol/L (ref 101–111)
Creatinine, Ser: 0.81 mg/dL (ref 0.44–1.00)
GFR calc Af Amer: >60 mL/min (ref >60)
GFR calc non Af Amer: >60 mL/min (ref >60)
GLUCOSE: 102 mg/dL — AB (ref 65–99)
Potassium: 4.1 mmol/L (ref 3.5–5.1)
Sodium: 144 mmol/L (ref 135–145)

## 2017-05-18 LAB — HEPATIC FUNCTION PANEL
ALT: 24 U/L (ref 14–54)
AST: 21 U/L (ref 15–41)
Albumin: 4.6 g/dL (ref 3.5–5.0)
Alkaline Phosphatase: 64 U/L (ref 38–126)
Total Bilirubin: 0.5 mg/dL (ref 0.3–1.2)
Total Protein: 7.6 g/dL (ref 6.5–8.1)

## 2017-05-18 LAB — TROPONIN I

## 2017-05-18 LAB — PREGNANCY, URINE: Preg Test, Ur: NEGATIVE

## 2017-05-18 LAB — LIPASE, BLOOD: LIPASE: 34 U/L (ref 11–51)

## 2017-05-18 LAB — D-DIMER, QUANTITATIVE: D-Dimer, Quant: 0.34 ug/mL-FEU (ref 0.00–0.50)

## 2017-05-18 MED ORDER — FAMOTIDINE 20 MG PO TABS: 20.0000 mg | ORAL_TABLET | Freq: Two times a day (BID) | ORAL | 0 refills | Status: DC

## 2017-05-18 MED ORDER — DICYCLOMINE HCL 20 MG PO TABS: 20.0000 mg | ORAL_TABLET | Freq: Four times a day (QID) | ORAL | 0 refills | Status: DC | PRN

## 2017-05-18 MED ORDER — FAMOTIDINE IN NACL 20-0.9 MG/50ML-% IV SOLN
20.0000 mg | Freq: Once | INTRAVENOUS | Status: AC
  Administered 2017-05-18: 20 mg via INTRAVENOUS
  Filled 2017-05-18: qty 50

## 2017-05-18 MED ORDER — KETOROLAC TROMETHAMINE 30 MG/ML IJ SOLN
30.0000 mg | Freq: Once | INTRAMUSCULAR | Status: AC
  Administered 2017-05-18: 30 mg via INTRAVENOUS
  Filled 2017-05-18: qty 1

## 2017-05-18 MED ORDER — METOCLOPRAMIDE HCL 10 MG PO TABS: 10.0000 mg | ORAL_TABLET | Freq: Four times a day (QID) | ORAL | 0 refills | Status: DC | PRN

## 2017-05-18 MED ORDER — GI COCKTAIL ~~LOC~~
30.0000 mL | Freq: Once | ORAL | Status: AC
  Administered 2017-05-18: 30 mL via ORAL
  Filled 2017-05-18: qty 30

## 2017-05-18 MED ORDER — METOCLOPRAMIDE HCL 5 MG/ML IJ SOLN
10.0000 mg | Freq: Once | INTRAMUSCULAR | Status: AC
  Administered 2017-05-18: 10 mg via INTRAVENOUS
  Filled 2017-05-18: qty 2

## 2017-05-18 MED ORDER — DIPHENHYDRAMINE HCL 50 MG/ML IJ SOLN
50.0000 mg | Freq: Once | INTRAMUSCULAR | Status: AC
  Administered 2017-05-18: 50 mg via INTRAVENOUS
  Filled 2017-05-18: qty 1

## 2017-05-18 NOTE — Discharge Instructions (Signed)
Take over the counter tylenol and benadryl, as directed on packaging, with the prescription given to you today, as needed for headache.  Keep a headache diary, as discussed. Eat a bland diet, avoiding greasy, fatty, fried foods, as well as spicy and acidic foods or beverages.  Avoid eating within 2 to 3 hours before going to bed or laying down.  Also avoid teas, colas, coffee, chocolate, pepermint and spearment.  Take over the counter maalox/mylanta, as directed on packaging, as needed for discomfort.  Take the prescriptions as directed.  Call your regular medical doctor on Monday to schedule a follow up appointment in the next 3 days. Call your GI doctor on Monday to schedule a follow up appointment this week.  Return to the Emergency Department immediately if worsening.

## 2017-05-18 NOTE — ED Triage Notes (Signed)
Pt reports waking up with headaches every day for the past 3 days. States she started having chest pain a couple of hours ago. C/O SOB. Centralized chest pain without radiation. No n/v. No diaphoresis.

## 2017-05-18 NOTE — ED Provider Notes (Signed)
[facility] EMERGENCY DEPARTMENT Provider Note   CSN: [id number] Arrival date & time: 05/18/17  1445     History   Chief Complaint Chief Complaint  Patient presents with  . Chest Pain    HPI Julie [name] is a 47 y.o. female.  HPI  Pt was seen at 1840. Per pt, c/o gradual onset and persistence of constant acute flair of her chronic headache for the past 1 week.  Describes the headache as per her usual chronic headache pain pattern.  Denies headache was sudden or maximal in onset or at any time.  Denies visual changes, no focal motor weakness, no tingling/numbness in extremities, no fevers, no neck pain, no rash.   Pt also c/o gradual onset and persistence of constant upper abd/lower chest "pain" since approximately 1230 today.  Has been associated with SOB.  Describes the abd pain as "pressure."  Denies N/V, no diarrhea, no fevers, no back pain, no rash, no CP/SOB, no black or blood in stools.       Past Medical History:  Diagnosis Date  . Anxiety   . Arthritis    Right hand ring finger  . Bad odor of urine 09/09/2015  . Complication of anesthesia   . Headache   . PONV (postoperative nausea and vomiting)     Patient Active Problem List   Diagnosis Date Noted  . Screening for genitourinary condition 09/13/2016  . Heme positive stool 09/13/2016  . Bad odor of urine 09/09/2015  . Anxiety 09/17/2013    Past Surgical History:  Procedure Laterality Date  . ABDOMINAL HYSTERECTOMY    . BLADDER SURGERY    . CLOSED REDUCTION FINGER WITH PERCUTANEOUS PINNING Right    Ring finger  . COLONOSCOPY WITH PROPOFOL N/A 01/16/2017   Procedure: COLONOSCOPY WITH PROPOFOL;  Surgeon: West Bali, MD;  Location: [facility];  Service: Endoscopy;  Laterality: N/A;  730     OB History    Gravida Para Term Preterm AB Living   2 2       2    SAB TAB Ectopic Multiple Live Births                   Home Medications    Prior to Admission medications   Medication Sig Start  Date End Date Taking? Authorizing Provider  busPIRone (BUSPAR) 5 MG tablet TAKE 1 TABLET BY MOUTH THREE TIMES DAILY Patient taking differently: TAKE 1 TABLET BY MOUTH UP TO THREE TIMES DAILY FOR ANXIETY (TYPICALLY TWICE DAILY) 12/28/16  Yes Adline Potter, NP  LORazepam (ATIVAN) 0.5 MG tablet TAKE 1 TABLET BY MOUTH TWICE DAILY AS NEEDED 02/12/17  Yes Adline Potter, NP    Family History Family History  Adopted: Yes  Problem Relation Age of Onset  . Irritable bowel syndrome Son     Social History Social History   Tobacco Use  . Smoking status: Never Smoker  . Smokeless tobacco: Never Used  Substance Use Topics  . Alcohol use: Yes    Comment: occassionally  . Drug use: No     Allergies   Demerol [meperidine]   Review of Systems Review of Systems ROS: Statement: All systems negative except as marked or noted in the HPI; Constitutional: Negative for fever and chills. ; ; Eyes: Negative for eye pain, redness and discharge. ; ; ENMT: Negative for ear pain, hoarseness, nasal congestion, sinus pressure and sore throat. ; ; Cardiovascular: Negative for palpitations, diaphoresis, dyspnea and peripheral edema. ; ;  Respiratory: Negative for cough, wheezing and stridor. ; ; Gastrointestinal: +abd pain. Negative for nausea, vomiting, diarrhea, blood in stool, hematemesis, jaundice and rectal bleeding.; ; Genitourinary: Negative for dysuria, flank pain and hematuria. ; ; Musculoskeletal: Negative for back pain and neck pain. Negative for swelling and trauma.; ; Skin: Negative for pruritus, rash, abrasions, blisters, bruising and skin lesion.; ; Neuro: +headache. Negative for lightheadedness and neck stiffness. Negative for weakness, altered level of consciousness, altered mental status, extremity weakness, paresthesias, involuntary movement, seizure and syncope.      Physical Exam Updated Vital Signs BP 134/87 (BP Location: Right Arm)   Pulse 67   Temp 98.4 F (36.9 C) (Oral)    Resp 16   Ht 5\' 10"  (1.778 m)   Wt 72.6 kg (160 lb)   SpO2 100%   BMI 22.96 kg/m   Physical Exam 1845: Physical examination:  Nursing notes reviewed; Vital signs and O2 SAT reviewed;  Constitutional: Well developed, Well nourished, Well hydrated, In no acute distress; Head:  Normocephalic, atraumatic; Eyes: EOMI, PERRL, No scleral icterus; ENMT: TM's clear bilat. +edemetous nasal turbinates bilat with clear rhinorrhea. Mouth and pharynx normal, Mucous membranes moist; Neck: Supple, Full range of motion, No lymphadenopathy; Cardiovascular: Regular rate and rhythm, No gallop; Respiratory: Breath sounds clear & equal bilaterally, No wheezes.  Speaking full sentences with ease, Normal respiratory effort/excursion; Chest: Nontender, Movement normal; Abdomen: Soft, +mid-epigastric and LUQ tenderness to palp. No rebound or guarding. Nondistended, Normal bowel sounds; Genitourinary: No CVA tenderness; Spine:  No midline CS, TS, LS tenderness.;; Extremities: Pulses normal, No tenderness, No edema, No calf edema or asymmetry.; Neuro: AA&Ox3, Major CN grossly intact. No facial droop. Speech clear. No gross focal motor or sensory deficits in extremities.; Skin: Color normal, Warm, Dry.; Psych:  Anxious.     ED Treatments / Results  Labs (all labs ordered are listed, but only abnormal results are displayed)   EKG  EKG Interpretation  Date/Time:  Friday May 18 2017 15:09:04 EST Ventricular Rate:  79 PR Interval:  170 QRS Duration: 80 QT Interval:  380 QTC Calculation: 435 R Axis:   46 Text Interpretation:  Sinus rhythm Otherwise normal ECG Baseline wander No old tracing to compare Confirmed by Samuel Jester [phone]) on 05/18/2017 6:22:48 PM       Radiology   Procedures Procedures (including critical care time)  Medications Ordered in ED Medications  gi cocktail (Maalox,Lidocaine,Donnatal) (30 mLs Oral Given 05/18/17 1901)  famotidine (PEPCID) IVPB 20 mg premix (0 mg Intravenous  Stopped 05/18/17 1953)  ketorolac (TORADOL) 30 MG/ML injection 30 mg (30 mg Intravenous Given 05/18/17 1901)  metoCLOPramide (REGLAN) injection 10 mg (10 mg Intravenous Given 05/18/17 1902)  diphenhydrAMINE (BENADRYL) injection 50 mg (50 mg Intravenous Given 05/18/17 1901)     Initial Impression / Assessment and Plan / ED Course  I have reviewed the triage vital signs and the nursing notes.  Pertinent labs & imaging results that were available during my care of the patient were reviewed by me and considered in my medical decision making (see chart for details).  MDM Reviewed: previous chart, nursing note and vitals Reviewed previous: labs and ECG Interpretation: labs, ECG and x-ray   Results for orders placed or performed during the hospital encounter of 05/18/17  Basic metabolic panel  Result Value Ref Range   Sodium 144 135 - 145 mmol/L   Potassium 4.1 3.5 - 5.1 mmol/L   Chloride 107 101 - 111 mmol/L   CO2 26 22 - 32  mmol/L   Glucose, Bld 102 (H) 65 - 99 mg/dL   BUN 17 6 - 20 mg/dL   Creatinine, Ser 2.840.81 0.44 - 1.00 mg/dL   Calcium 13.210.0 8.9 - 44.010.3 mg/dL   GFR calc non Af Amer >60 >60 mL/min   GFR calc Af Amer >60 >60 mL/min   Anion gap 11 5 - 15  CBC  Result Value Ref Range   WBC 8.4 4.0 - 10.5 K/uL   RBC 4.84 3.87 - 5.11 MIL/uL   Hemoglobin 15.0 12.0 - 15.0 g/dL   HCT 10.245.6 72.536.0 - 36.646.0 %   MCV 94.2 78.0 - 100.0 fL   MCH 31.0 26.0 - 34.0 pg   MCHC 32.9 30.0 - 36.0 g/dL   RDW 44.012.4 34.711.5 - 42.515.5 %   Platelets 275 150 - 400 K/uL  Troponin I  Result Value Ref Range   Troponin I <0.03 <0.03 ng/mL  D-dimer, quantitative  Result Value Ref Range   D-Dimer, Quant 0.34 0.00 - 0.50 ug/mL-FEU  Pregnancy, urine  Result Value Ref Range   Preg Test, Ur NEGATIVE NEGATIVE  Urinalysis, Routine w reflex microscopic  Result Value Ref Range   Color, Urine YELLOW YELLOW   APPearance HAZY (A) CLEAR   Specific Gravity, Urine 1.019 1.005 - 1.030   pH 5.0 5.0 - 8.0   Glucose, UA NEGATIVE  NEGATIVE mg/dL   Hgb urine dipstick SMALL (A) NEGATIVE   Bilirubin Urine NEGATIVE NEGATIVE   Ketones, ur NEGATIVE NEGATIVE mg/dL   Protein, ur NEGATIVE NEGATIVE mg/dL   Nitrite NEGATIVE NEGATIVE   Leukocytes, UA MODERATE (A) NEGATIVE   RBC / HPF 0-5 0 - 5 RBC/hpf   WBC, UA 6-30 0 - 5 WBC/hpf   Bacteria, UA RARE (A) NONE SEEN   Squamous Epithelial / LPF 0-5 (A) NONE SEEN   Mucus PRESENT   Lipase, blood  Result Value Ref Range   Lipase 34 11 - 51 U/L  Hepatic function panel  Result Value Ref Range   Total Protein 7.6 6.5 - 8.1 g/dL   Albumin 4.6 3.5 - 5.0 g/dL   AST 21 15 - 41 U/L   ALT 24 14 - 54 U/L   Alkaline Phosphatase 64 38 - 126 U/L   Total Bilirubin 0.5 0.3 - 1.2 mg/dL   Bilirubin, Direct <9.5<0.1 (L) 0.1 - 0.5 mg/dL   Indirect Bilirubin NOT CALCULATED 0.3 - 0.9 mg/dL  Troponin I  Result Value Ref Range   Troponin I <0.03 <0.03 ng/mL   Dg Chest 2 View Result Date: 05/18/2017 CLINICAL DATA:  Chest pain short of breath EXAM: CHEST  2 VIEW COMPARISON:  None. FINDINGS: The heart size and mediastinal contours are within normal limits. Both lungs are clear. The visualized skeletal structures are unremarkable. IMPRESSION: No active cardiopulmonary disease. Electronically Signed   By: Marlan Palauharles  Clark M.D.   On: 05/18/2017 15:37   Dg Abd 2 Views Result Date: 05/18/2017 CLINICAL DATA:  Abdominal pain. EXAM: ABDOMEN - 2 VIEW COMPARISON:  CT 04/20/2014 FINDINGS: No dilated loops of large or small bowel. Gas and stool in the rectum. No pathologic calcifications. No organomegaly. No acute osseous abnormality IMPRESSION: No bowel obstruction. Electronically Signed   By: Genevive BiStewart  Edmunds M.D.   On: 05/18/2017 19:51    2130:  Pt refuses CT-H:  states her headache is per her usual headache pattern and she "doesn't need one."  Pt feels better after meds and wants to go home now. Has tol PO well while in  the ED without N/V. Doubt PE as cause for symptoms with normal d-dimer and low risk [place].   Doubt ACS as cause for symptoms with normal troponin and EKG without acute STTW changes after 9+ hours of constant atypical symptoms.  Workup reassuring. Neuro exam remains intact. Tx symptomatically at this time. Dx and testing d/w pt and family.  Questions answered.  Verb understanding, agreeable to d/c home with outpt f/u.      Final Clinical Impressions(s) / ED Diagnoses   Final diagnoses:  Abdominal pain    ED Discharge Orders    None       Samuel Jester, DO 05/20/17 1348

## 2017-06-04 ENCOUNTER — Telehealth: Payer: Self-pay | Admitting: Gastroenterology

## 2017-06-04 MED ORDER — PANTOPRAZOLE SODIUM 40 MG PO TBEC: 40.0000 mg | DELAYED_RELEASE_TABLET | Freq: Every day | ORAL | 3 refills | Status: DC

## 2017-06-04 NOTE — Telephone Encounter (Signed)
Called pt to discuss.

## 2017-06-04 NOTE — Telephone Encounter (Signed)
LMOM new Rx sent in and to please keep appt. Call if questions.

## 2017-06-04 NOTE — Telephone Encounter (Signed)
Pt said the Pepcid helped some, but she still had problems and had to take Rolaids and Tums. Please advise on medication. [place] [facility] [place]. Forwarding to Tana CoastLeslie Lewis, PA, who last saw pt in the office.

## 2017-06-04 NOTE — Telephone Encounter (Signed)
rx for pantoprazole instead of pepcid.  Keep upcoming ov as this is a NEW problem we have not addressed with her before.

## 2017-06-04 NOTE — Telephone Encounter (Signed)
Pt has OV the end of this month. She was seen in the ER and was given pepcid. She is out of pepcid and asked if we could call in a prescription to hold her until her OV. I transferred call to nurse VM. Please advise.

## 2017-06-10 ENCOUNTER — Other Ambulatory Visit: Payer: Self-pay | Admitting: Adult Health

## 2017-06-20 ENCOUNTER — Other Ambulatory Visit: Payer: Self-pay | Admitting: Adult Health

## 2017-06-26 ENCOUNTER — Other Ambulatory Visit: Payer: Self-pay | Admitting: *Deleted

## 2017-06-26 ENCOUNTER — Encounter: Payer: Self-pay | Admitting: Nurse Practitioner

## 2017-06-26 ENCOUNTER — Ambulatory Visit: Payer: BLUE CROSS/BLUE SHIELD | Admitting: Nurse Practitioner

## 2017-06-26 ENCOUNTER — Encounter: Payer: Self-pay | Admitting: *Deleted

## 2017-06-26 ENCOUNTER — Telehealth: Payer: Self-pay | Admitting: *Deleted

## 2017-06-26 DIAGNOSIS — R079 Chest pain, unspecified: Secondary | ICD-10-CM

## 2017-06-26 DIAGNOSIS — R1013 Epigastric pain: Secondary | ICD-10-CM | POA: Diagnosis not present

## 2017-06-26 NOTE — Telephone Encounter (Signed)
Pre-op scheduled for 08/07/17 at 9:00am. LMOVM. Letter mailed.

## 2017-06-26 NOTE — Progress Notes (Addendum)
REVIEWED-NO ADDITIONAL RECOMMENDATIONS.  Referring Provider: Nathen MayPllc, [facility] Primary Care Physician:  Nathen MayPllc, [facility] Primary GI:  Dr. Darrick PennaFields  Chief Complaint  Patient presents with  . Gastroesophageal Reflux  . Chest Pain    HPI:   Julie [name] is a 47 y.o. female who presents for evaluation of GERD and "chest pain."  The patient was last seen in our office 11/08/2016 for heme positive stools.  Noted some intermittent loose or hard stools but daily bowel movement.  Those symptoms tend to be food related per the patient.  Denies heartburn at that time.  Recommended colonoscopy.  Colonoscopy up-to-date 01/16/2017 which only found heme positive stools due to internal hemorrhoids.  Recommended 10-year repeat (2028).  Recently seen in the emergency room 05/19/2007 for abdominal pain in the upper abdomen, chest pain, as well as other complaints.  Onset of her GI symptoms and chest pain or gradual and persistent since earlier in the day of her ER presentation.  Associated shortness of breath.  No other GI symptoms.  EKG was normal.  Labs including CBC, CMP, troponins normal.  Diagnostic abdomen and chest x-rays normal.  D-dimer normal.  She was discharged home and recommended GI follow-up.  She was discharged on famotidine 20 mg twice daily.  Today she states she is ok overall. ER provider mentions reflux versus gallbladder to the patient as etiology. Today is "not so good." Ate tuna and crackers last night for dinner which caused heartburn. Famotidine wasn't helping and she was put on protonix. Has been avoiding greasy/spicy foods. Described as pressure "like someone sitting on my chest." Ate multiple TUMS, milk, baking soda which helped enough for her to go to sleep, but woke this morning with symptoms. Describes chest/esophageal pain, LUQ pain. Notes sour taste with belching. Denies vomiting but occasional nausea. Denies hematochezia, melena, unintentional weight loss,  fever, chills. Denies dyspnea, dizziness, lightheadedness, syncope, near syncope. Denies any other upper or lower GI symptoms.  Protonix somewhat effective, but not great. Still with breakthrough a couple times a week.  Past Medical History:  Diagnosis Date  . Anxiety   . Arthritis    Right hand ring finger  . Bad odor of urine 09/09/2015  . Complication of anesthesia   . Headache   . PONV (postoperative nausea and vomiting)     Past Surgical History:  Procedure Laterality Date  . ABDOMINAL HYSTERECTOMY    . BLADDER SURGERY    . CLOSED REDUCTION FINGER WITH PERCUTANEOUS PINNING Right    Ring finger  . COLONOSCOPY WITH PROPOFOL N/A 01/16/2017   Procedure: COLONOSCOPY WITH PROPOFOL;  Surgeon: West BaliFields, Sandi L, MD;  Location: [facility];  Service: Endoscopy;  Laterality: N/A;  730     Current Outpatient Medications  Medication Sig Dispense Refill  . busPIRone (BUSPAR) 5 MG tablet TAKE 1 TABLET BY MOUTH THREE TIMES DAILY 90 tablet 0  . dicyclomine (BENTYL) 20 MG tablet Take 1 tablet (20 mg total) by mouth every 6 (six) hours as needed for spasms (abdominal cramping). 15 tablet 0  . LORazepam (ATIVAN) 0.5 MG tablet TAKE 1 TABLET BY MOUTH TWICE DAILY AS NEEDED 60 tablet 0  . metoCLOPramide (REGLAN) 10 MG tablet Take 1 tablet (10 mg total) by mouth every 6 (six) hours as needed for nausea (or headache). 6 tablet 0  . pantoprazole (PROTONIX) 40 MG tablet Take 1 tablet (40 mg total) by mouth daily before breakfast. 30 tablet 3   No current facility-administered medications for  this visit.     Allergies as of 06/26/2017 - Review Complete 06/26/2017  Allergen Reaction Noted  . Demerol [meperidine] Nausea And Vomiting 11/21/2011    Family History  Adopted: Yes  Problem Relation Age of Onset  . Irritable bowel syndrome Son   . Colon cancer Neg Hx   . Gastric cancer Neg Hx   . Esophageal cancer Neg Hx     Social History   Socioeconomic History  . Marital status: Legally  Separated    Spouse name: None  . Number of children: None  . Years of education: None  . Highest education level: None  Social Needs  . Financial resource strain: None  . Food insecurity - worry: None  . Food insecurity - inability: None  . Transportation needs - medical: None  . Transportation needs - non-medical: None  Occupational History  . None  Tobacco Use  . Smoking status: Never Smoker  . Smokeless tobacco: Never Used  Substance and Sexual Activity  . Alcohol use: Yes    Comment: occassionally  . Drug use: No  . Sexual activity: Yes    Birth control/protection: Surgical    Comment: hyst  Other Topics Concern  . None  Social History Narrative  . None    Review of Systems: Complete ROS negative except as per HPI.   Physical Exam: BP 116/65   Pulse 74   Temp (!) 97.2 F (36.2 C) (Oral)   Ht 5\' 10"  (1.778 m)   Wt 163 lb 3.2 oz (74 kg)   BMI 23.42 kg/m  General:   Alert and oriented. Pleasant and cooperative. Well-nourished and well-developed.  Eyes:  Without icterus, sclera clear and conjunctiva pink.  Ears:  Normal auditory acuity. Cardiovascular:  S1, S2 present without murmurs appreciated. Extremities without clubbing or edema. Respiratory:  Clear to auscultation bilaterally. No wheezes, rales, or rhonchi. No distress.  Gastrointestinal:  +BS, soft, and non-distended. Moderate TTP epigastric area and LUQ. No HSM noted. No guarding or rebound. No masses appreciated.  Rectal:  Deferred  Musculoskalatal:  Symmetrical without gross deformities. Neurologic:  Alert and oriented x4;  grossly normal neurologically. Psych:  Alert and cooperative. Normal mood and affect. Heme/Lymph/Immune: No excessive bruising noted.    06/26/2017 8:32 AM   Disclaimer: This note was dictated with voice recognition software. Similar sounding words can inadvertently be transcribed and may not be corrected upon review.

## 2017-06-26 NOTE — Assessment & Plan Note (Signed)
The patient describes significant dyspepsia symptoms.  The symptoms are ongoing.  She was seen in the emergency department and started on famotidine which did not help.  She was subsequently switched to Protonix once daily.  She is having minimal effect from this but persistent pain.  On exam she is quite tender to the epigastric abdominal and left upper quadrant areas.  She continues to have breakthrough reflux that is significant and includes pain in the after mentioned areas, bitter sour taste.  This seems to be a bit acute onset.  She does have persistent nausea with her symptoms.  At this point I will increase her Protonix to twice daily to try to maximize effect at this time.  We will set her up for an upper endoscopy to evaluate for etiologies for her symptoms.  Differentials include gastritis, esophagitis, gastric erosion, gastric ulcer, H. pylori infection, duodenitis, duodenal ulcer.  She would likely benefit from an H. pylori biopsy.  She does not feel that she can tolerate being off PPI long enough for a breath test and I tend to agree, based on the severity of her symptoms on PPI.  Follow-up in 3 months.  Proceed with EGD on propofol/MAC with Dr. Oneida Alar in near future: the risks, benefits, and alternatives have been discussed with the patient in detail. The patient states understanding and desires to proceed.  The patient is on BuSpar, Ativan.  Occasional alcohol.  Denies drugs.  No other anticoagulants, anxiolytics, chronic pain medications, or antidepressants.  We will plan for the procedure on propofol/MAC to promote adequate sedation as was done for her last procedure.  She indicates she has a history of postanesthesia nausea and vomiting but states that she did well on propofol with her colonoscopy.

## 2017-06-26 NOTE — Progress Notes (Signed)
cc'ed to pcp °

## 2017-06-26 NOTE — Assessment & Plan Note (Addendum)
When the patient was in the emergency room she describes her symptoms as chest pain.  Cardiac workup in the ER was negative.  On exam her pain is epigastric abdomen and left upper quadrant.  I recommended she follow-up with her primary care, as she has not done yet, for her symptoms.  I recommended she inquire about possible need for a cardiology workup.  She states she will do this.  No active chest pain today.  ER precautions were given.

## 2017-06-26 NOTE — Patient Instructions (Addendum)
1. We will schedule your procedure for you. 2. Increase your Protonix to twice a day.  Notify us if you need a refill. 3. Follow-up in 3 months. 4. Call your primary care to follow-up after the ER and inquire about any need for cardiology evaluation. 5. Further recommendations will be made after your procedure. 6. Call us if you have any questions or concerns.

## 2017-06-29 DIAGNOSIS — Z6825 Body mass index (BMI) 25.0-25.9, adult: Secondary | ICD-10-CM | POA: Diagnosis not present

## 2017-06-29 DIAGNOSIS — Z1389 Encounter for screening for other disorder: Secondary | ICD-10-CM | POA: Diagnosis not present

## 2017-06-29 DIAGNOSIS — N342 Other urethritis: Secondary | ICD-10-CM | POA: Diagnosis not present

## 2017-06-29 DIAGNOSIS — E663 Overweight: Secondary | ICD-10-CM | POA: Diagnosis not present

## 2017-07-09 ENCOUNTER — Other Ambulatory Visit: Payer: Self-pay | Admitting: Adult Health

## 2017-07-25 ENCOUNTER — Telehealth: Payer: Self-pay | Admitting: Gastroenterology

## 2017-07-25 NOTE — Telephone Encounter (Signed)
Called spoke with pt and made aware of below. She does have bentyl and will take this as well.

## 2017-07-25 NOTE — Telephone Encounter (Signed)
Pt called with questions about her upcoming procedure and her having a stomach virus. She would like for the nurse to call her back at [phone]

## 2017-07-25 NOTE — Telephone Encounter (Addendum)
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 She can start a probiotic daily. She can pick up samples to try from our office if she would like.  Has she tried taking Bentyl (its on her chart)?  Let us know of any worsening pain.

## 2017-07-25 NOTE — Telephone Encounter (Signed)
Patient states she had a stomach virus last week and every since she has had a constant pain from her belly button over to left side. Has some nausea but no vomiting. Denies diarrhea.constipation. Requesting recs.

## 2017-07-25 NOTE — Telephone Encounter (Signed)
Lmom, waiting on a return call.  

## 2017-07-26 NOTE — Telephone Encounter (Signed)
Noted  

## 2017-07-31 ENCOUNTER — Telehealth: Payer: Self-pay | Admitting: Gastroenterology

## 2017-07-31 ENCOUNTER — Telehealth: Payer: Self-pay | Admitting: Obstetrics & Gynecology

## 2017-07-31 NOTE — Telephone Encounter (Signed)
Pt is no longer using [facility] and her new pharmacy will be [facility]. She said that a refill request of pantoprazole would be coming to us and it needed to be changed from once a day to twice a day.

## 2017-07-31 NOTE — Telephone Encounter (Signed)
The [facility]. Forwarding to the refill box. Eric had increased to bid at [facility].

## 2017-08-01 ENCOUNTER — Telehealth: Payer: Self-pay

## 2017-08-01 MED ORDER — LORAZEPAM 0.5 MG PO TABS
0.5000 mg | ORAL_TABLET | Freq: Two times a day (BID) | ORAL | 1 refills | Status: DC | PRN
Start: 2017-08-01 — End: 2018-05-06

## 2017-08-01 MED ORDER — PANTOPRAZOLE SODIUM 40 MG PO TBEC: 40.0000 mg | DELAYED_RELEASE_TABLET | Freq: Two times a day (BID) | ORAL | 5 refills | Status: DC

## 2017-08-01 MED ORDER — BUSPIRONE HCL 5 MG PO TABS: 5.0000 mg | ORAL_TABLET | Freq: Three times a day (TID) | ORAL | 3 refills | Status: DC

## 2017-08-01 NOTE — Telephone Encounter (Signed)
[facility] wanted to confirm that pt is taking Pantoprazole 40mg  bid. Pantoprazole was change from once daily to bid 06/2017.

## 2017-08-01 NOTE — Addendum Note (Signed)
Addended by: Tiffany KocherLEWIS, Kassius Battiste S on: 08/01/2017 10:38 PM   Modules accepted: Orders

## 2017-08-01 NOTE — Telephone Encounter (Signed)
Left message that meds refilled to [facility]

## 2017-08-03 NOTE — Patient Instructions (Signed)
Julie [name]  08/03/2017     @PREFPERIOPPHARMACY @   Your procedure is scheduled on  08/14/2017   [facility] to [facility] at  615   A.M.  Call this number if you have problems the morning of surgery:  [phone]   Remember:  Do not eat food or drink liquids after midnight.  Take these medicines the morning of surgery with A SIP OF WATER  Buspar, ativan, reglan, protonix.   Do not wear jewelry, make-up or nail polish.  Do not wear lotions, powders, or perfumes, or deodorant.  Do not shave 48 hours prior to surgery.  Men may shave face and neck.  Do not bring valuables to the hospital.  [facility] is not responsible for any belongings or valuables.  Contacts, dentures or bridgework may not be worn into surgery.  Leave your suitcase in the car.  After surgery it may be brought to your room.  For patients admitted to the hospital, discharge time will be determined by your treatment team.  Patients discharged the day of surgery will not be allowed to drive home.   Name and phone number of your driver:   family Special instructions:  Follow the diet instructions given to you by Dr Darrick PennaFields office.  Please read over the following fact sheets that you were given. Anesthesia Post-op Instructions and Care and Recovery After Surgery       Esophagogastroduodenoscopy Esophagogastroduodenoscopy (EGD) is a procedure to examine the lining of the esophagus, stomach, and first part of the small intestine (duodenum). This procedure is done to check for problems such as inflammation, bleeding, ulcers, or growths. During this procedure, a long, flexible, lighted tube with a camera attached (endoscope) is inserted down the throat. Tell a health care provider about:  Any allergies you have.  All medicines you are taking, including vitamins, herbs, eye drops, creams, and over-the-counter medicines.  Any problems you or family members have had with anesthetic  medicines.  Any blood disorders you have.  Any surgeries you have had.  Any medical conditions you have.  Whether you are pregnant or may be pregnant. What are the risks? Generally, this is a safe procedure. However, problems may occur, including:  Infection.  Bleeding.  A tear (perforation) in the esophagus, stomach, or duodenum.  Trouble breathing.  Excessive sweating.  Spasms of the larynx.  A slowed heartbeat.  Low blood pressure.  What happens before the procedure?  Follow instructions from your health care provider about eating or drinking restrictions.  Ask your health care provider about: ? Changing or stopping your regular medicines. This is especially important if you are taking diabetes medicines or blood thinners. ? Taking medicines such as aspirin and ibuprofen. These medicines can thin your blood. Do not take these medicines before your procedure if your health care provider instructs you not to.  Plan to have someone take you home after the procedure.  If you wear dentures, be ready to remove them before the procedure. What happens during the procedure?  To reduce your risk of infection, your health care team will wash or sanitize their hands.  An IV tube will be put in a vein in your hand or arm. You will get medicines and fluids through this tube.  You will be given one or more of the following: ? A medicine to help you relax (sedative). ? A medicine to numb the area (local anesthetic). This medicine may be  sprayed into your throat. It will make you feel more comfortable and keep you from gagging or coughing during the procedure. ? A medicine for pain.  A mouth guard may be placed in your mouth to protect your teeth and to keep you from biting on the endoscope.  You will be asked to lie on your left side.  The endoscope will be lowered down your throat into your esophagus, stomach, and duodenum.  Air will be put into the endoscope. This will  help your health care provider see better.  The lining of your esophagus, stomach, and duodenum will be examined.  Your health care provider may: ? Take a tissue sample so it can be looked at in a lab (biopsy). ? Remove growths. ? Remove objects (foreign bodies) that are stuck. ? Treat any bleeding with medicines or other devices that stop tissue from bleeding. ? Widen (dilate) or stretch narrowed areas of your esophagus and stomach.  The endoscope will be taken out. The procedure may vary among health care providers and hospitals. What happens after the procedure?  Your blood pressure, heart rate, breathing rate, and blood oxygen level will be monitored often until the medicines you were given have worn off.  Do not eat or drink anything until the numbing medicine has worn off and your gag reflex has returned. This information is not intended to replace advice given to you by your health care provider. Make sure you discuss any questions you have with your health care provider. Document Released: 08/18/2004 Document Revised: 09/23/2015 Document Reviewed: 03/11/2015 Elsevier Interactive Patient Education  2018 [employer]. Esophagogastroduodenoscopy, Care After Refer to this sheet in the next few weeks. These instructions provide you with information about caring for yourself after your procedure. Your health care provider may also give you more specific instructions. Your treatment has been planned according to current medical practices, but problems sometimes occur. Call your health care provider if you have any problems or questions after your procedure. What can I expect after the procedure? After the procedure, it is common to have:  A sore throat.  Nausea.  Bloating.  Dizziness.  Fatigue.  Follow these instructions at home:  Do not eat or drink anything until the numbing medicine (local anesthetic) has worn off and your gag reflex has returned. You will know that the  local anesthetic has worn off when you can swallow comfortably.  Do not drive for 24 hours if you received a medicine to help you relax (sedative).  If your health care provider took a tissue sample for testing during the procedure, make sure to get your test results. This is your responsibility. Ask your health care provider or the department performing the test when your results will be ready.  Keep all follow-up visits as told by your health care provider. This is important. Contact a health care provider if:  You cannot stop coughing.  You are not urinating.  You are urinating less than usual. Get help right away if:  You have trouble swallowing.  You cannot eat or drink.  You have throat or chest pain that gets worse.  You are dizzy or light-headed.  You faint.  You have nausea or vomiting.  You have chills.  You have a fever.  You have severe abdominal pain.  You have black, tarry, or bloody stools. This information is not intended to replace advice given to you by your health care provider. Make sure you discuss any questions you have with your  health care provider. Document Released: 04/03/2012 Document Revised: 09/23/2015 Document Reviewed: 03/11/2015 Elsevier Interactive Patient Education  2018 [place] Anesthesia is a term that refers to techniques, procedures, and medicines that help a person stay safe and comfortable during a medical procedure. Monitored anesthesia care, or sedation, is one type of anesthesia. Your anesthesia specialist may recommend sedation if you will be having a procedure that does not require you to be unconscious, such as:  Cataract surgery.  A dental procedure.  A biopsy.  A colonoscopy.  During the procedure, you may receive a medicine to help you relax (sedative). There are three levels of sedation:  Mild sedation. At this level, you may feel awake and relaxed. You will be able to follow  directions.  Moderate sedation. At this level, you will be sleepy. You may not remember the procedure.  Deep sedation. At this level, you will be asleep. You will not remember the procedure.  The more medicine you are given, the deeper your level of sedation will be. Depending on how you respond to the procedure, the anesthesia specialist may change your level of sedation or the type of anesthesia to fit your needs. An anesthesia specialist will monitor you closely during the procedure. Let your health care provider know about:  Any allergies you have.  All medicines you are taking, including vitamins, herbs, eye drops, creams, and over-the-counter medicines.  Any use of steroids (by mouth or as a cream).  Any problems you or family members have had with sedatives and anesthetic medicines.  Any blood disorders you have.  Any surgeries you have had.  Any medical conditions you have, such as sleep apnea.  Whether you are pregnant or may be pregnant.  Any use of cigarettes, alcohol, or street drugs. What are the risks? Generally, this is a safe procedure. However, problems may occur, including:  Getting too much medicine (oversedation).  Nausea.  Allergic reaction to medicines.  Trouble breathing. If this happens, a breathing tube may be used to help with breathing. It will be removed when you are awake and breathing on your own.  Heart trouble.  Lung trouble.  Before the procedure Staying hydrated Follow instructions from your health care provider about hydration, which may include:  Up to 2 hours before the procedure - you may continue to drink clear liquids, such as water, clear fruit juice, black coffee, and plain tea.  Eating and drinking restrictions Follow instructions from your health care provider about eating and drinking, which may include:  8 hours before the procedure - stop eating heavy meals or foods such as meat, fried foods, or fatty foods.  6 hours  before the procedure - stop eating light meals or foods, such as toast or cereal.  6 hours before the procedure - stop drinking milk or drinks that contain milk.  2 hours before the procedure - stop drinking clear liquids.  Medicines Ask your health care provider about:  Changing or stopping your regular medicines. This is especially important if you are taking diabetes medicines or blood thinners.  Taking medicines such as aspirin and ibuprofen. These medicines can thin your blood. Do not take these medicines before your procedure if your health care provider instructs you not to.  Tests and exams  You will have a physical exam.  You may have blood tests done to show: ? How well your kidneys and liver are working. ? How well your blood can clot.  General instructions  Plan to have someone take you home from the hospital or clinic.  If you will be going home right after the procedure, plan to have someone with you for 24 hours.  What happens during the procedure?  Your blood pressure, heart rate, breathing, level of pain and overall condition will be monitored.  An IV tube will be inserted into one of your veins.  Your anesthesia specialist will give you medicines as needed to keep you comfortable during the procedure. This may mean changing the level of sedation.  The procedure will be performed. After the procedure  Your blood pressure, heart rate, breathing rate, and blood oxygen level will be monitored until the medicines you were given have worn off.  Do not drive for 24 hours if you received a sedative.  You may: ? Feel sleepy, clumsy, or nauseous. ? Feel forgetful about what happened after the procedure. ? Have a sore throat if you had a breathing tube during the procedure. ? Vomit. This information is not intended to replace advice given to you by your health care provider. Make sure you discuss any questions you have with your health care provider. Document  Released: 01/11/2005 Document Revised: 09/24/2015 Document Reviewed: 08/08/2015 Elsevier Interactive Patient Education  2018 [place], Care After These instructions provide you with information about caring for yourself after your procedure. Your health care provider may also give you more specific instructions. Your treatment has been planned according to current medical practices, but problems sometimes occur. Call your health care provider if you have any problems or questions after your procedure. What can I expect after the procedure? After your procedure, it is common to:  Feel sleepy for several hours.  Feel clumsy and have poor balance for several hours.  Feel forgetful about what happened after the procedure.  Have poor judgment for several hours.  Feel nauseous or vomit.  Have a sore throat if you had a breathing tube during the procedure.  Follow these instructions at home: For at least 24 hours after the procedure:   Do not: ? Participate in activities in which you could fall or become injured. ? Drive. ? Use heavy machinery. ? Drink alcohol. ? Take sleeping pills or medicines that cause drowsiness. ? Make important decisions or sign legal documents. ? Take care of children on your own.  Rest. Eating and drinking  Follow the diet that is recommended by your health care provider.  If you vomit, drink water, juice, or soup when you can drink without vomiting.  Make sure you have little or no nausea before eating solid foods. General instructions  Have a responsible adult stay with you until you are awake and alert.  Take over-the-counter and prescription medicines only as told by your health care provider.  If you smoke, do not smoke without supervision.  Keep all follow-up visits as told by your health care provider. This is important. Contact a health care provider if:  You keep feeling nauseous or you keep  vomiting.  You feel light-headed.  You develop a rash.  You have a fever. Get help right away if:  You have trouble breathing. This information is not intended to replace advice given to you by your health care provider. Make sure you discuss any questions you have with your health care provider. Document Released: 08/08/2015 Document Revised: 12/08/2015 Document Reviewed: 08/08/2015 Elsevier Interactive Patient Education  [employer].

## 2017-08-07 ENCOUNTER — Other Ambulatory Visit: Payer: Self-pay

## 2017-08-07 ENCOUNTER — Encounter (HOSPITAL_COMMUNITY): Payer: Self-pay

## 2017-08-07 ENCOUNTER — Encounter (HOSPITAL_COMMUNITY)
Admission: RE | Admit: 2017-08-07 | Discharge: 2017-08-07 | Disposition: A | Discharge status: Home / Self Care | Payer: BLUE CROSS/BLUE SHIELD | Source: Ambulatory Visit | Attending: Gastroenterology | Admitting: Gastroenterology

## 2017-08-07 DIAGNOSIS — Z01812 Encounter for preprocedural laboratory examination: Secondary | ICD-10-CM | POA: Insufficient documentation

## 2017-08-07 HISTORY — DX: Gastro-esophageal reflux disease without esophagitis: K21.9

## 2017-08-07 LAB — BASIC METABOLIC PANEL
Anion gap: 11 (ref 5–15)
BUN: 14 mg/dL (ref 6–20)
CO2: 26 mmol/L (ref 22–32)
Calcium: 9.3 mg/dL (ref 8.9–10.3)
Chloride: 103 mmol/L (ref 101–111)
Creatinine, Ser: 0.89 mg/dL (ref 0.44–1.00)
GFR calc non Af Amer: >60 mL/min (ref >60)
Glucose, Bld: 106 mg/dL — ABNORMAL HIGH (ref 65–99)
POTASSIUM: 3.6 mmol/L (ref 3.5–5.1)
SODIUM: 140 mmol/L (ref 135–145)

## 2017-08-07 LAB — CBC WITH DIFFERENTIAL/PLATELET
BASOS ABS: 0.1 10*3/uL (ref 0.0–0.1)
Basophils Relative: 1 %
EOS ABS: 0.1 10*3/uL (ref 0.0–0.7)
Eosinophils Relative: 3 %
HCT: 43 % (ref 36.0–46.0)
Hemoglobin: 13.9 g/dL (ref 12.0–15.0)
LYMPHS PCT: 35 %
Lymphs Abs: 2 10*3/uL (ref 0.7–4.0)
MCH: 30.8 pg (ref 26.0–34.0)
MCHC: 32.3 g/dL (ref 30.0–36.0)
MCV: 95.3 fL (ref 78.0–100.0)
Monocytes Absolute: 0.3 10*3/uL (ref 0.1–1.0)
Monocytes Relative: 5 %
Neutro Abs: 3.2 10*3/uL (ref 1.7–7.7)
Neutrophils Relative %: 56 %
PLATELETS: 279 10*3/uL (ref 150–400)
RBC: 4.51 MIL/uL (ref 3.87–5.11)
RDW: 12.8 % (ref 11.5–15.5)
WBC: 5.7 10*3/uL (ref 4.0–10.5)

## 2017-08-12 ENCOUNTER — Other Ambulatory Visit: Payer: Self-pay

## 2017-08-12 ENCOUNTER — Emergency Department (HOSPITAL_COMMUNITY): Payer: BLUE CROSS/BLUE SHIELD

## 2017-08-12 ENCOUNTER — Emergency Department (HOSPITAL_COMMUNITY)
Admission: EM | Admit: 2017-08-12 | Discharge: 2017-08-12 | Disposition: A | Discharge status: Home / Self Care | Payer: BLUE CROSS/BLUE SHIELD | Attending: Emergency Medicine | Admitting: Emergency Medicine

## 2017-08-12 ENCOUNTER — Encounter (HOSPITAL_COMMUNITY): Payer: Self-pay | Admitting: Emergency Medicine

## 2017-08-12 DIAGNOSIS — Y999 Unspecified external cause status: Secondary | ICD-10-CM | POA: Insufficient documentation

## 2017-08-12 DIAGNOSIS — T148XXA Other injury of unspecified body region, initial encounter: Secondary | ICD-10-CM | POA: Insufficient documentation

## 2017-08-12 DIAGNOSIS — R1032 Left lower quadrant pain: Secondary | ICD-10-CM | POA: Insufficient documentation

## 2017-08-12 DIAGNOSIS — Y939 Activity, unspecified: Secondary | ICD-10-CM | POA: Diagnosis not present

## 2017-08-12 DIAGNOSIS — R109 Unspecified abdominal pain: Secondary | ICD-10-CM | POA: Diagnosis not present

## 2017-08-12 DIAGNOSIS — R1012 Left upper quadrant pain: Secondary | ICD-10-CM | POA: Diagnosis not present

## 2017-08-12 DIAGNOSIS — S39011A Strain of muscle, fascia and tendon of abdomen, initial encounter: Secondary | ICD-10-CM | POA: Diagnosis not present

## 2017-08-12 DIAGNOSIS — X58XXXA Exposure to other specified factors, initial encounter: Secondary | ICD-10-CM | POA: Diagnosis not present

## 2017-08-12 DIAGNOSIS — Y929 Unspecified place or not applicable: Secondary | ICD-10-CM | POA: Insufficient documentation

## 2017-08-12 LAB — COMPREHENSIVE METABOLIC PANEL
ALK PHOS: 68 U/L (ref 38–126)
ALT: 25 U/L (ref 14–54)
AST: 21 U/L (ref 15–41)
Albumin: 4.2 g/dL (ref 3.5–5.0)
Anion gap: 9 (ref 5–15)
BILIRUBIN TOTAL: 0.5 mg/dL (ref 0.3–1.2)
BUN: 15 mg/dL (ref 6–20)
CALCIUM: 9.1 mg/dL (ref 8.9–10.3)
CHLORIDE: 103 mmol/L (ref 101–111)
CO2: 26 mmol/L (ref 22–32)
CREATININE: 0.84 mg/dL (ref 0.44–1.00)
Glucose, Bld: 91 mg/dL (ref 65–99)
Potassium: 4.1 mmol/L (ref 3.5–5.1)
Sodium: 138 mmol/L (ref 135–145)
Total Protein: 7.3 g/dL (ref 6.5–8.1)

## 2017-08-12 LAB — URINALYSIS, ROUTINE W REFLEX MICROSCOPIC
Bilirubin Urine: NEGATIVE
GLUCOSE, UA: NEGATIVE mg/dL
KETONES UR: NEGATIVE mg/dL
Nitrite: NEGATIVE
PH: 6 (ref 5.0–8.0)
Protein, ur: NEGATIVE mg/dL
Specific Gravity, Urine: 1.013 (ref 1.005–1.030)

## 2017-08-12 LAB — CBC
HCT: 42.8 % (ref 36.0–46.0)
Hemoglobin: 14 g/dL (ref 12.0–15.0)
MCH: 30.8 pg (ref 26.0–34.0)
MCHC: 32.7 g/dL (ref 30.0–36.0)
MCV: 94.3 fL (ref 78.0–100.0)
PLATELETS: 277 10*3/uL (ref 150–400)
RBC: 4.54 MIL/uL (ref 3.87–5.11)
RDW: 12.6 % (ref 11.5–15.5)
WBC: 7 10*3/uL (ref 4.0–10.5)

## 2017-08-12 LAB — LIPASE, BLOOD: Lipase: 37 U/L (ref 11–51)

## 2017-08-12 MED ORDER — MORPHINE SULFATE (PF) 4 MG/ML IV SOLN
4.0000 mg | Freq: Once | INTRAVENOUS | Status: AC
  Administered 2017-08-12: 4 mg via INTRAVENOUS
  Filled 2017-08-12: qty 1

## 2017-08-12 MED ORDER — METHOCARBAMOL 500 MG PO TABS: 500.0000 mg | ORAL_TABLET | Freq: Four times a day (QID) | ORAL | 0 refills | Status: DC

## 2017-08-12 MED ORDER — ONDANSETRON HCL 4 MG/2ML IJ SOLN
4.0000 mg | Freq: Once | INTRAMUSCULAR | Status: AC
  Administered 2017-08-12: 4 mg via INTRAVENOUS
  Filled 2017-08-12: qty 2

## 2017-08-12 MED ORDER — METHOCARBAMOL 500 MG PO TABS
500.0000 mg | ORAL_TABLET | Freq: Once | ORAL | Status: AC
Start: 2017-08-12 — End: 2017-08-12
  Administered 2017-08-12: 500 mg via ORAL
  Filled 2017-08-12: qty 1

## 2017-08-12 MED ORDER — IOPAMIDOL (ISOVUE-300) INJECTION 61%
100.0000 mL | Freq: Once | INTRAVENOUS | Status: AC | PRN
  Administered 2017-08-12: 100 mL via INTRAVENOUS

## 2017-08-12 MED ORDER — IBUPROFEN 600 MG PO TABS
600.0000 mg | ORAL_TABLET | Freq: Four times a day (QID) | ORAL | 0 refills | Status: DC | PRN
Start: 2017-08-12 — End: 2017-10-01

## 2017-08-12 MED ORDER — HYDROCODONE-ACETAMINOPHEN 5-325 MG PO TABS: 1.0000 | ORAL_TABLET | ORAL | 0 refills | Status: DC | PRN

## 2017-08-12 MED ORDER — KETOROLAC TROMETHAMINE 30 MG/ML IJ SOLN
15.0000 mg | Freq: Once | INTRAMUSCULAR | Status: AC
  Administered 2017-08-12: 15 mg via INTRAVENOUS
  Filled 2017-08-12: qty 1

## 2017-08-12 NOTE — ED Triage Notes (Signed)
Scheduled for endo on Tuesday - Dr Darrick PennaFields  Since yesterday, L flank pain with N  Las tBM today which was N

## 2017-08-12 NOTE — Discharge Instructions (Addendum)
Do not drive within 4 hours of taking hydrocodone as this will make you drowsy.  Avoid lifting,  Bending,  Twisting or any other activity that worsens your pain over the next week.  Apply a heating pad to your lower back for 20 minutes several times daily..  You should get rechecked if your symptoms are not better over the next 5 days,  Or you develop increased pain,  Weakness in your leg(s) or loss of bladder or bowel function - these are symptoms of a worse injury. ° °

## 2017-08-12 NOTE — ED Provider Notes (Signed)
[facility] EMERGENCY DEPARTMENT Provider Note   CSN: [id number] Arrival date & time: 08/12/17  1450     History   Chief Complaint Chief Complaint  Patient presents with  . Abdominal Pain    HPI Julie [name] is a 47 y.o. female presenting with left sided abdominal and flank pain.  Her symptoms started one week ago which was mild and tolerable but became sharp, stabbing and constant since yesterday.  Her pain is worsened with movement and palpation at the site, and she has nausea without emesis. She denies fevers, chills, dysuria, bowel habit changes.  She does have a h/o GERD and is anticipating an endoscopy in 2 days for for further evaluation of this condition.  She takes protonix for this. She has had no other medications and has found no alleviators for this pain.  The history is provided by the patient.    Past Medical History:  Diagnosis Date  . Anxiety   . Arthritis    Right hand ring finger  . Bad odor of urine 09/09/2015  . Complication of anesthesia   . GERD (gastroesophageal reflux disease)   . Headache   . PONV (postoperative nausea and vomiting)     Patient Active Problem List   Diagnosis Date Noted  . Dyspepsia 06/26/2017  . Chest pain 06/26/2017  . Screening for genitourinary condition 09/13/2016  . Heme positive stool 09/13/2016  . Bad odor of urine 09/09/2015  . Anxiety 09/17/2013    Past Surgical History:  Procedure Laterality Date  . ABDOMINAL HYSTERECTOMY    . BLADDER SURGERY    . CLOSED REDUCTION FINGER WITH PERCUTANEOUS PINNING Right    Ring finger  . COLONOSCOPY WITH PROPOFOL N/A 01/16/2017   Procedure: COLONOSCOPY WITH PROPOFOL;  Surgeon: West Bali, MD;  Location: [facility] SUITE;  Service: Endoscopy;  Laterality: N/A;  730      OB History    Gravida  2   Para  2   Term      Preterm      AB      Living  2     SAB      TAB      Ectopic      Multiple      Live Births               Home Medications     Prior to Admission medications   Medication Sig Start Date End Date Taking? Authorizing Provider  busPIRone (BUSPAR) 5 MG tablet Take 1 tablet (5 mg total) by mouth 3 (three) times daily. 08/01/17   Adline Potter, NP  dicyclomine (BENTYL) 20 MG tablet Take 1 tablet (20 mg total) by mouth every 6 (six) hours as needed for spasms (abdominal cramping). 05/18/17   Samuel Jester, DO  HYDROcodone-acetaminophen (NORCO/VICODIN) 5-325 MG tablet Take 1 tablet by mouth every 4 (four) hours as needed. 08/12/17   Burgess Amor, PA-C  ibuprofen (ADVIL,MOTRIN) 600 MG tablet Take 1 tablet (600 mg total) by mouth every 6 (six) hours as needed. 08/12/17   Burgess Amor, PA-C  LORazepam (ATIVAN) 0.5 MG tablet Take 1 tablet (0.5 mg total) by mouth 2 (two) times daily as needed. 08/01/17   Adline Potter, NP  methocarbamol (ROBAXIN) 500 MG tablet Take 1 tablet (500 mg total) by mouth 4 (four) times daily for 10 days. 08/12/17 08/22/17  Burgess Amor, PA-C  metoCLOPramide (REGLAN) 10 MG tablet Take 1 tablet (10 mg total) by mouth every 6 (  six) hours as needed for nausea (or headache). Patient not taking: Reported on 07/31/2017 05/18/17   Samuel Jester, DO  Multiple Vitamin (MULTIVITAMIN WITH MINERALS) TABS tablet Take 1 tablet by mouth daily.    [provider]  pantoprazole (PROTONIX) 40 MG tablet Take 1 tablet (40 mg total) by mouth 2 (two) times daily before a meal. 08/01/17   Tiffany Kocher, PA-C    Family History Family History  Adopted: Yes  Problem Relation Age of Onset  . Irritable bowel syndrome Son   . Colon cancer Neg Hx   . Gastric cancer Neg Hx   . Esophageal cancer Neg Hx     Social History Social History   Tobacco Use  . Smoking status: Never Smoker  . Smokeless tobacco: Never Used  Substance Use Topics  . Alcohol use: Yes    Comment: occassionally  . Drug use: No     Allergies   Demerol [meperidine]   Review of Systems Review of Systems  Constitutional: Negative for  chills and fever.  HENT: Negative for congestion and sore throat.   Eyes: Negative.   Respiratory: Negative for chest tightness and shortness of breath.   Cardiovascular: Negative for chest pain.  Gastrointestinal: Positive for abdominal pain and nausea. Negative for constipation, diarrhea and vomiting.  Genitourinary: Negative.  Negative for dysuria and hematuria.  Musculoskeletal: Negative for arthralgias, joint swelling and neck pain.  Skin: Negative.  Negative for rash and wound.  Neurological: Negative for dizziness, weakness, light-headedness, numbness and headaches.  Psychiatric/Behavioral: Negative.      Physical Exam Updated Vital Signs BP 125/66 (BP Location: Right Arm)   Pulse 66   Temp 98.2 F (36.8 C) (Oral)   Resp 16   Ht  (1.778 m)   Wt 73.5 kg (162 lb)   SpO2 99%   BMI 23.24 kg/m   Physical Exam  Constitutional: She appears well-developed and well-nourished.  HENT:  Head: Normocephalic and atraumatic.  Eyes: Conjunctivae are normal.  Neck: Normal range of motion.  Cardiovascular: Normal rate, regular rhythm, normal heart sounds and intact distal pulses.  Pulmonary/Chest: Effort normal and breath sounds normal. She has no wheezes.  Abdominal: Soft. Bowel sounds are normal. She exhibits no distension, no fluid wave and no mass. There is no hepatosplenomegaly. There is tenderness in the left upper quadrant. There is no rigidity, no guarding and no CVA tenderness.  Left lateral abd pain mid axillary line.  No cva tenderness.  Musculoskeletal: Normal range of motion.  Neurological: She is alert.  Skin: Skin is warm and dry.  Psychiatric: She has a normal mood and affect.  Nursing note and vitals reviewed.    ED Treatments / Results  Labs (all labs ordered are listed, but only abnormal results are displayed) Labs Reviewed  URINALYSIS, ROUTINE W REFLEX MICROSCOPIC - Abnormal; Notable for the following components:      Result Value   APPearance HAZY  (*)    Hgb urine dipstick SMALL (*)    Leukocytes, UA LARGE (*)    Bacteria, UA RARE (*)    Squamous Epithelial / LPF 6-30 (*)    All other components within normal limits  URINE CULTURE  LIPASE, BLOOD  COMPREHENSIVE METABOLIC PANEL  CBC    EKG None  Radiology Ct Abdomen Pelvis W Contrast  Result Date: 08/12/2017 CLINICAL DATA:  Left flank pain EXAM: CT ABDOMEN AND PELVIS WITH CONTRAST TECHNIQUE: Multidetector CT imaging of the abdomen and pelvis was performed using the standard  protocol following bolus administration of intravenous contrast. CONTRAST:  ISOVUE-300 IOPAMIDOL (ISOVUE-300) INJECTION 61% COMPARISON:  04/20/2014 FINDINGS: Lower chest: Lung bases are clear. Hepatobiliary: Liver is within normal limits. Gallbladder is unremarkable. No intrahepatic or extrahepatic ductal dilatation. Pancreas: Within normal limits. Spleen: Within normal limits. Adrenals/Urinary Tract: Adrenal glands are within normal limits. Mild left upper pole renal cortical scarring. Right kidney is within normal limits. No hydronephrosis. Bladder is within normal limits. Stomach/Bowel: Stomach is within normal limits. No evidence of bowel obstruction. Normal appendix (coronal image 40). Vascular/Lymphatic: No evidence of abdominal aortic aneurysm. No suspicious abdominopelvic lymphadenopathy. Reproductive: Status post hysterectomy. No adnexal masses. Other: No abdominopelvic ascites. Tiny fat containing left inguinal hernia (series 2/image 79). Musculoskeletal: Mild degenerative changes of the lumbar spine, most prominent at L5-S1. IMPRESSION: No CT findings to account for the patient's left flank pain. No evidence of bowel obstruction.  Normal appendix. Status post hysterectomy. Electronically Signed   By: Charline Bills M.D.   On: 08/12/2017 17:02    Procedures Procedures (including critical care time)  Medications Ordered in ED Medications  ondansetron (ZOFRAN) injection 4 mg (4 mg Intravenous Given  08/12/17 1631)  morphine 4 MG/ML injection 4 mg (4 mg Intravenous Given 08/12/17 1633)  iopamidol (ISOVUE-300) 61 % injection 100 mL (100 mLs Intravenous Contrast Given 08/12/17 1649)  ketorolac (TORADOL) 30 MG/ML injection 15 mg (15 mg Intravenous Given 08/12/17 1749)  methocarbamol (ROBAXIN) tablet 500 mg (500 mg Oral Given 08/12/17 1828)     Initial Impression / Assessment and Plan / ED Course  I have reviewed the triage vital signs and the nursing notes.  Pertinent labs & imaging results that were available during my care of the patient were reviewed by me and considered in my medical decision making (see chart for details).     Left flank and lateral abd pain, reproducible with palpation, negative Ct imaging with no renal or ureteral stones, no intraabdominal process to explain pain sx, suspect musculoskeletal source.  Trial of robaxin, ibuprofen, hydrocodone. Heat tx, activities as tolerated Plan f/u with pcp for a recheck if sx persist or worsen.    The patient appears reasonably screened and/or stabilized for discharge and I doubt any other medical condition or other [facility] requiring further screening, evaluation, or treatment in the ED at this time prior to discharge.   Final Clinical Impressions(s) / ED Diagnoses   Final diagnoses:  Musculoskeletal strain    ED Discharge Orders        Ordered    methocarbamol (ROBAXIN) 500 MG tablet  4 times daily     08/12/17 1820    ibuprofen (ADVIL,MOTRIN) 600 MG tablet  Every 6 hours PRN     08/12/17 1820    HYDROcodone-acetaminophen (NORCO/VICODIN) 5-325 MG tablet  Every 4 hours PRN     08/12/17 1820       Burgess Amor, PA-C 08/13/17 0156    Loren Racer, MD 08/16/17 1539

## 2017-08-14 ENCOUNTER — Encounter (HOSPITAL_COMMUNITY)
Admission: RE | Disposition: A | Discharge status: Home / Self Care | Payer: Self-pay | Source: Ambulatory Visit | Attending: Gastroenterology

## 2017-08-14 ENCOUNTER — Encounter (HOSPITAL_COMMUNITY): Payer: Self-pay | Admitting: *Deleted

## 2017-08-14 ENCOUNTER — Other Ambulatory Visit: Payer: Self-pay

## 2017-08-14 ENCOUNTER — Ambulatory Visit (HOSPITAL_COMMUNITY): Payer: BLUE CROSS/BLUE SHIELD | Admitting: Anesthesiology

## 2017-08-14 ENCOUNTER — Ambulatory Visit (HOSPITAL_COMMUNITY)
Admission: RE | Admit: 2017-08-14 | Discharge: 2017-08-14 | Disposition: A | Discharge status: Home / Self Care | Payer: BLUE CROSS/BLUE SHIELD | Source: Ambulatory Visit | Attending: Gastroenterology | Admitting: Gastroenterology

## 2017-08-14 DIAGNOSIS — K317 Polyp of stomach and duodenum: Secondary | ICD-10-CM

## 2017-08-14 DIAGNOSIS — R1013 Epigastric pain: Secondary | ICD-10-CM

## 2017-08-14 DIAGNOSIS — Z79899 Other long term (current) drug therapy: Secondary | ICD-10-CM | POA: Diagnosis not present

## 2017-08-14 DIAGNOSIS — F419 Anxiety disorder, unspecified: Secondary | ICD-10-CM | POA: Insufficient documentation

## 2017-08-14 DIAGNOSIS — K2970 Gastritis, unspecified, without bleeding: Secondary | ICD-10-CM

## 2017-08-14 DIAGNOSIS — M19041 Primary osteoarthritis, right hand: Secondary | ICD-10-CM | POA: Insufficient documentation

## 2017-08-14 DIAGNOSIS — K2980 Duodenitis without bleeding: Secondary | ICD-10-CM | POA: Diagnosis not present

## 2017-08-14 DIAGNOSIS — K3189 Other diseases of stomach and duodenum: Secondary | ICD-10-CM | POA: Insufficient documentation

## 2017-08-14 DIAGNOSIS — K219 Gastro-esophageal reflux disease without esophagitis: Secondary | ICD-10-CM | POA: Diagnosis not present

## 2017-08-14 DIAGNOSIS — Z885 Allergy status to narcotic agent status: Secondary | ICD-10-CM | POA: Insufficient documentation

## 2017-08-14 DIAGNOSIS — R1310 Dysphagia, unspecified: Secondary | ICD-10-CM | POA: Insufficient documentation

## 2017-08-14 HISTORY — PX: ESOPHAGOGASTRODUODENOSCOPY (EGD) WITH PROPOFOL: SHX5813

## 2017-08-14 HISTORY — PX: BIOPSY: SHX5522

## 2017-08-14 LAB — URINE CULTURE: Special Requests: NORMAL

## 2017-08-14 SURGERY — ESOPHAGOGASTRODUODENOSCOPY (EGD) WITH PROPOFOL
Anesthesia: Monitor Anesthesia Care

## 2017-08-14 MED ORDER — PROPOFOL 10 MG/ML IV BOLUS
INTRAVENOUS | Status: AC
  Filled 2017-08-14: qty 40

## 2017-08-14 MED ORDER — GLYCOPYRROLATE 0.2 MG/ML IJ SOLN
INTRAMUSCULAR | Status: AC
Start: 2017-08-14 — End: ?
  Filled 2017-08-14: qty 1

## 2017-08-14 MED ORDER — LACTATED RINGERS IV SOLN
INTRAVENOUS | Status: DC
  Administered 2017-08-14: 1000 mL via INTRAVENOUS

## 2017-08-14 MED ORDER — LIDOCAINE VISCOUS 2 % MT SOLN
OROMUCOSAL | Status: DC | PRN
  Administered 2017-08-14: 5 mL via OROMUCOSAL

## 2017-08-14 MED ORDER — MIDAZOLAM HCL 5 MG/5ML IJ SOLN
INTRAMUSCULAR | Status: DC | PRN
  Administered 2017-08-14: 2 mg via INTRAVENOUS

## 2017-08-14 MED ORDER — GLYCOPYRROLATE 0.2 MG/ML IJ SOLN
INTRAMUSCULAR | Status: DC | PRN
  Administered 2017-08-14: 0.2 mg via INTRAVENOUS

## 2017-08-14 MED ORDER — PROPOFOL 500 MG/50ML IV EMUL
INTRAVENOUS | Status: DC | PRN
  Administered 2017-08-14: 125 ug/kg/min via INTRAVENOUS

## 2017-08-14 MED ORDER — LIDOCAINE VISCOUS 2 % MT SOLN
OROMUCOSAL | Status: AC
  Filled 2017-08-14: qty 15

## 2017-08-14 MED ORDER — DICYCLOMINE HCL 10 MG PO CAPS
20.0000 mg | ORAL_CAPSULE | Freq: Once | ORAL | Status: AC
  Administered 2017-08-14: 20 mg via ORAL
  Filled 2017-08-14: qty 2

## 2017-08-14 MED ORDER — MIDAZOLAM HCL 2 MG/2ML IJ SOLN
INTRAMUSCULAR | Status: AC
  Filled 2017-08-14: qty 2

## 2017-08-14 MED ORDER — LIDOCAINE VISCOUS 2 % MT SOLN: OROMUCOSAL | 5 refills | Status: DC

## 2017-08-14 NOTE — Op Note (Signed)
[facility] Patient Name: Julie LentMichelle Mavis Procedure Date: 08/14/2017 7:17 AM MRN: [id number] Date of Birth: 08/12/1970 Attending MD: Jonette EvaSandi Sherree Shankman MD, MD CSN: [id number] Age: 47 Admit Type: Outpatient Procedure:                Upper GI endoscopy WITH COLD FORCEPS BIOPSY Indications:              Dyspepsia, Esophageal reflux symptoms that persist                            despite appropriate therapy Providers:                Jonette EvaSandi Prentice Sackrider MD, MD, Loma MessingLurae B. Patsy LagerAlbert RN, RN, Edrick Kinsammy                            Vaught, RN Referring MD:             Elfredia NevinsLawrence Fusco, MD Medicines:                Propofol per Anesthesia Complications:            No immediate complications. Estimated Blood Loss:     Estimated blood loss was minimal. Procedure:                Pre-Anesthesia Assessment:                           - Prior to the procedure, a History and Physical                            was performed, and patient medications and                            allergies were reviewed. The patient's tolerance of                            previous anesthesia was also reviewed. The risks                            and benefits of the procedure and the sedation                            options and risks were discussed with the patient.                            All questions were answered, and informed consent                            was obtained. Prior Anticoagulants: The patient has                            taken naproxen. ASA Grade Assessment: II - A                            patient with mild systemic disease. After reviewing  the risks and benefits, the patient was deemed in                            satisfactory condition to undergo the procedure.                            After obtaining informed consent, the endoscope was                            passed under direct vision. Throughout the                            procedure, the patient's blood pressure,  pulse, and                            oxygen saturations were monitored continuously. The                            [id number] ([id number]) scope was introduced through the                            mouth, and advanced to the second part of duodenum.                            The upper GI endoscopy was accomplished without                            difficulty. The patient tolerated the procedure                            well. Scope In: 7:41:30 AM Scope Out: 7:51:00 AM Total Procedure Duration: 0 hours 9 minutes 30 seconds  Findings:      The examined esophagus was normal.      Multiple small sessile polyps with no stigmata of recent bleeding were       found in the gastric fundus and in the gastric body. The polyp was       removed with a cold biopsy forceps. Resection and retrieval were       complete.      Diffuse mild inflammation characterized by congestion (edema) and       erythema was found in the gastric antrum. Biopsies were taken with a       cold forceps for Helicobacter pylori testing.      Segmental mild inflammation characterized by congestion (edema) and       erythema was found in the entire duodenum. Biopsies for histology were       taken with a cold forceps for evaluation of celiac disease. Impression:               - Multiple gastric polyps.                           - MILD Gastritis/Duodenitis. Moderate Sedation:      Per Anesthesia Care Recommendation:           - Await pathology results.                           -  Low fat diet. AVOID REFLUX TRIGGERS.                           - Continue present medications. ADD VISCOUS                            LIDOCAINE PRN.                           - Return to my office in 3 months.                           - Patient has a contact number available for                            emergencies. The signs and symptoms of potential                            delayed complications were discussed with the                             patient. Return to normal activities tomorrow.                            Written discharge instructions were provided to the                            patient. Procedure Code(s):        --- Professional ---                           [phone], Esophagogastroduodenoscopy, flexible,                            transoral; with biopsy, single or multiple Diagnosis Code(s):        --- Professional ---                           K31.7, Polyp of stomach and duodenum                           K29.70, Gastritis, unspecified, without bleeding                           K29.80, Duodenitis without bleeding                           R10.13, Epigastric pain                           K21.9, Gastro-esophageal reflux disease without                            esophagitis CPT copyright 2017 [employer]. All rights reserved. The codes documented in this report are preliminary and upon coder review may  be revised to meet current compliance requirements. Jonette Eva, MD Jonette Eva  MD, MD 08/14/2017 7:58:56 AM This report has been signed electronically. Number of Addenda: 0

## 2017-08-14 NOTE — Anesthesia Postprocedure Evaluation (Signed)
Anesthesia Post Note  Patient: Julie [name]  Procedure(s) Performed: ESOPHAGOGASTRODUODENOSCOPY (EGD) WITH PROPOFOL (N/A ) BIOPSY  Patient location during evaluation: PACU Anesthesia Type: MAC Level of consciousness: awake and patient cooperative Pain management: pain level controlled Vital Signs Assessment: post-procedure vital signs reviewed and stable Respiratory status: spontaneous breathing, nonlabored ventilation and respiratory function stable Cardiovascular status: blood pressure returned to baseline Postop Assessment: no apparent nausea or vomiting Anesthetic complications: no     Last Vitals:  Vitals:   08/14/17 0720 08/14/17 0725  BP: 119/71 133/81  Pulse:    Resp: (!) 57 (!) 50  Temp:    SpO2: 98% 93%    Last Pain:  Vitals:   08/14/17 0654  TempSrc: Oral  PainSc: 4                  Lalonnie Shaffer J

## 2017-08-14 NOTE — Discharge Instructions (Signed)
You have BENIGN STOMACH POLYPS, gastritis AND DUODENITIS due to ibuprofen & ALEVE. I biopsied your stomach AND SMALL BOWEL.   STRICTLY AVOID TRIGGERS FOR REFLUX. SEE INFO BELOW.  USE VISCOUS LIDOCAINE 2 TSP EVERY 4 HOURS WHEN NEEDED TO RELIVE FOR CHEST, OR UPPER ABDOMINAL PAIN OR HEARTBURN. USE NO MORE THAN 8 DOSES A DAY. IT WILL MAKE YOUR MOUTH, ESOPHAGUS, AND STOMACH NUMB.  FOLLOW A LOW FAT DIET. MEATS SHOULD BE BAKED, BROILED, OR BOILED. AVOID FRIED FOODS.SEE INFO BELOW.  YOUR BIOPSY RESULTS WILL BE AVAILABLE IN 7 DAYS.   FOLLOW UP IN 3 MOS.  UPPER ENDOSCOPY AFTER CARE Read the instructions outlined below and refer to this sheet in the next week. These discharge instructions provide you with general information on caring for yourself after you leave the hospital. While your treatment has been planned according to the most current medical practices available, unavoidable complications occasionally occur. If you have any problems or questions after discharge, call DR. Cabrina Shiroma, [phone].  ACTIVITY  You may resume your regular activity, but move at a slower pace for the next 24 hours.   Take frequent rest periods for the next 24 hours.   Walking will help get rid of the air and reduce the bloated feeling in your belly (abdomen).   No driving for 24 hours (because of the medicine (anesthesia) used during the test).   You may shower.   Do not sign any important legal documents or operate any machinery for 24 hours (because of the anesthesia used during the test).    NUTRITION  Drink plenty of fluids.   You may resume your normal diet as instructed by your doctor.   Begin with a light meal and progress to your normal diet. Heavy or fried foods are harder to digest and may make you feel sick to your stomach (nauseated).   Avoid alcoholic beverages for 24 hours or as instructed.    MEDICATIONS  You may resume your normal medications.   WHAT YOU CAN EXPECT TODAY  Some  feelings of bloating in the abdomen.   Passage of more gas than usual.    IF YOU HAD A BIOPSY TAKEN DURING THE UPPER ENDOSCOPY:  Eat a soft diet IF YOU HAVE NAUSEA, BLOATING, ABDOMINAL PAIN, OR VOMITING.    FINDING OUT THE RESULTS OF YOUR TEST Not all test results are available during your visit. DR. Darrick Penna WILL CALL YOU WITHIN 14 DAYS OF YOUR PROCEDUE WITH YOUR RESULTS. Do not assume everything is normal if you have not heard from DR. Makoto Sellitto, CALL HER OFFICE AT [phone].  SEEK IMMEDIATE MEDICAL ATTENTION AND CALL THE OFFICE: [phone] IF:  You have more than a spotting of blood in your stool.   Your belly is swollen (abdominal distention).   You are nauseated or vomiting.   You have a temperature over 101F.   You have abdominal pain or discomfort that is severe or gets worse throughout the day.   Lifestyle and home remedies TO HELP CONTROL HEARTBURN.  You may eliminate or reduce the frequency of heartburn by making the following lifestyle changes:   Control your weight. Being overweight is a major risk factor for heartburn and GERD. Excess pounds put pressure on your abdomen, pushing up your stomach and causing acid to back up into your esophagus.    Eat smaller meals. 4 TO 6 MEALS A DAY. This reduces pressure on the lower esophageal sphincter, helping to prevent the valve from opening and acid from washing back  into your esophagus.    Loosen your belt. Clothes that fit tightly around your waist put pressure on your abdomen and the lower esophageal sphincter.     Eliminate heartburn triggers. Everyone has specific triggers.Common triggers such as fatty or fried foods, spicy food, tomato sauce, carbonated beverages, alcohol, chocolate, mint, garlic, onion, caffeine and nicotine may make heartburn worse.    Avoid stooping or bending. Tying your shoes is OK. Bending over for longer periods to weed your garden isn't, especially soon after eating.    Don't lie down  after a meal. Wait at least three to four hours after eating before going to bed, and don't lie down right after eating.    PLACE THE HEAD OF YOUR BED ON 6 INCH BLOCKS.  Alternative medicine  Several home remedies exist for treating GERD, but they provide only temporary relief. They include drinking baking soda (sodium bicarbonate) added to water or drinking other fluids such as baking soda mixed with cream of tartar and water.  Although these liquids create temporary relief by neutralizing, washing away or buffering acids, eventually they aggravate the situation by adding gas and fluid to your stomach, increasing pressure and causing more acid reflux. Further, adding more sodium to your diet may increase your blood pressure and add stress to your heart, and excessive bicarbonate ingestion can alter the acid-base balance in your body.   Gastritis/DUODENITIS  Gastritis is an inflammation (the body's way of reacting to injury and/or infection) of the stomach. DUODENITIS is an inflammation (the body's way of reacting to injury and/or infection) of the FIRST PART OF THE SMALL INTESTINES. It is often caused by bacterial (germ) infections. It can also be caused BY ASPIRIN, BC/GOODY POWDER'S, (IBUPROFEN) MOTRIN, OR ALEVE (NAPROXEN), chemicals (including alcohol), SPICY FOODS, and medications. This illness may be associated with generalized malaise (feeling tired, not well), UPPER ABDOMINAL STOMACH cramps, and fever. One common bacterial cause of gastritis is an organism known as H. Pylori. This can be treated with antibiotics.    Low-Fat Diet  BREADS, CEREALS, PASTA, RICE, DRIED PEAS, AND BEANS These products are high in carbohydrates and most are low in fat. Therefore, they can be increased in the diet as substitutes for fatty foods. They too, however, contain calories and should not be eaten in excess. Cereals can be eaten for snacks as well as for breakfast.  Include foods that contain fiber  (fruits, vegetables, whole grains, and legumes). Research shows that fiber may lower blood cholesterol levels, especially the water-soluble fiber found in fruits, vegetables, oat products, and legumes.  FRUITS AND VEGETABLES It is good to eat fruits and vegetables. Besides being sources of fiber, both are rich in vitamins and some minerals. They help you get the daily allowances of these nutrients. Fruits and vegetables can be used for snacks and desserts.  MEATS Limit lean meat, chicken, [place], and fish to no more than 6 ounces per day.  Beef, Pork, and Lamb Use lean cuts of beef, pork, and lamb. Lean cuts include:  Extra-lean ground beef.  Arm roast.  Sirloin tip.  Center-cut ham.  Round steak.  Loin chops.  Rump roast.  Tenderloin.  Trim all fat off the outside of meats before cooking. It is not necessary to severely decrease the intake of red meat, but lean choices should be made. Lean meat is rich in protein and contains a highly absorbable form of iron. Premenopausal women, in particular, should avoid reducing lean red meat because this could increase the  risk for low red blood cells (iron-deficiency anemia).  Chicken and [place] These are good sources of protein. The fat of poultry can be reduced by removing the skin and underlying fat layers before cooking. Chicken and [place] can be substituted for lean red meat in the diet. Poultry should not be fried or covered with high-fat sauces.  Fish and Shellfish Fish is a good source of protein. Shellfish contain cholesterol, but they usually are low in saturated fatty acids. The preparation of fish is important. Like chicken and [place], they should not be fried or covered with high-fat sauces.  EGGS Egg whites contain no fat or cholesterol. They can be eaten often. Try 1 to 2 egg whites instead of whole eggs in recipes or use egg substitutes that do not contain yolk.  MILK AND DAIRY PRODUCTS Use skim or 1% milk instead of 2% or whole  milk. Decrease whole milk, natural, and processed cheeses. Use nonfat or low-fat (2%) cottage cheese or low-fat cheeses made from vegetable oils. Choose nonfat or low-fat (1 to 2%) yogurt. Experiment with evaporated skim milk in recipes that call for heavy cream. Substitute low-fat yogurt or low-fat cottage cheese for sour cream in dips and salad dressings. Have at least 2 servings of low-fat dairy products, such as 2 glasses of skim (or 1%) milk each day to help get your daily calcium intake.  FATS AND OILS Reduce the total intake of fats, especially saturated fat. Butterfat, lard, and beef fats are high in saturated fat and cholesterol. These should be avoided as much as possible. Vegetable fats do not contain cholesterol, but certain vegetable fats, such as coconut oil, palm oil, and palm kernel oil are very high in saturated fats. These should be limited. These fats are often used in bakery goods, processed foods, popcorn, oils, and nondairy creamers. Vegetable shortenings and some peanut butters contain hydrogenated oils, which are also saturated fats. Read the labels on these foods and check for saturated vegetable oils.  Unsaturated vegetable oils and fats do not raise blood cholesterol. However, they should be limited because they are fats and are high in calories. Total fat should still be limited to 30% of your daily caloric intake. Desirable liquid vegetable oils are corn oil, cottonseed oil, olive oil, canola oil, safflower oil, soybean oil, and sunflower oil. Peanut oil is not as good, but small amounts are acceptable. Buy a heart-healthy tub margarine that has no partially hydrogenated oils in the ingredients. Mayonnaise and salad dressings often are made from unsaturated fats, but they should also be limited because of their high calorie and fat content. Seeds, nuts, peanut butter, olives, and avocados are high in fat, but the fat is mainly the unsaturated type. These foods should be limited  mainly to avoid excess calories and fat.  OTHER EATING TIPS Snacks  Most sweets should be limited as snacks. They tend to be rich in calories and fats, and their caloric content outweighs their nutritional value. Some good choices in snacks are graham crackers, melba toast, soda crackers, bagels (no egg), English muffins, fruits, and vegetables. These snacks are preferable to snack crackers, [place] fries, and chips. Popcorn should be air-popped or cooked in small amounts of liquid vegetable oil.  Desserts Eat fruit, low-fat yogurt, and fruit ices instead of pastries, cake, and cookies. Sherbet, angel food cake, gelatin dessert, frozen low-fat yogurt, or other frozen products that do not contain saturated fat (pure fruit juice bars, frozen ice pops) are also acceptable.   COOKING  METHODS Choose those methods that use little or no fat. They include: Poaching.  Braising.  Steaming.  Grilling.  Baking.  Stir-frying.  Broiling.  Microwaving.  Foods can be cooked in a nonstick pan without added fat, or use a nonfat cooking spray in regular cookware. Limit fried foods and avoid frying in saturated fat. Add moisture to lean meats by using water, broth, cooking wines, and other nonfat or low-fat sauces along with the cooking methods mentioned above. Soups and stews should be chilled after cooking. The fat that forms on top after a few hours in the refrigerator should be skimmed off. When preparing meals, avoid using excess salt. Salt can contribute to raising blood pressure in some people.  EATING AWAY FROM HOME Order entres, potatoes, and vegetables without sauces or butter. When meat exceeds the size of a deck of cards (3 to 4 ounces), the rest can be taken home for another meal. Choose vegetable or fruit salads and ask for low-calorie salad dressings to be served on the side. Use dressings sparingly. Limit high-fat toppings, such as bacon, crumbled eggs, cheese, sunflower seeds, and olives. Ask for  heart-healthy tub margarine instead of butter.

## 2017-08-14 NOTE — Anesthesia Preprocedure Evaluation (Signed)
Anesthesia Evaluation  Patient identified by MRN, date of birth, ID band Patient awake    Reviewed: Allergy & Precautions, H&P , NPO status , Patient's Chart, lab work & pertinent test results, reviewed documented beta blocker date and time   Airway Mallampati: II  TM Distance: >3 FB Neck ROM: full    Dental no notable dental hx. (+) Teeth Intact   Pulmonary neg pulmonary ROS,    Pulmonary exam normal breath sounds clear to auscultation       Cardiovascular Exercise Tolerance: Good negative cardio ROS   Rhythm:regular Rate:Normal     Neuro/Psych negative neurological ROS  negative psych ROS   GI/Hepatic negative GI ROS, Neg liver ROS,   Endo/Other  negative endocrine ROS  Renal/GU negative Renal ROS  negative genitourinary   Musculoskeletal   Abdominal   Peds  Hematology negative hematology ROS (+)   Anesthesia Other Findings H/o Low back pain H/O PONV  Reproductive/Obstetrics negative OB ROS                             Anesthesia Physical Anesthesia Plan  ASA: II  Anesthesia Plan: MAC   Post-op Pain Management:    Induction:   PONV Risk Score and Plan:   Airway Management Planned:   Additional Equipment:   Intra-op Plan:   Post-operative Plan:   Informed Consent: I have reviewed the patients History and Physical, chart, labs and discussed the procedure including the risks, benefits and alternatives for the proposed anesthesia with the patient or authorized representative who has indicated his/her understanding and acceptance.   Dental Advisory Given  Plan Discussed with: CRNA  Anesthesia Plan Comments:         Anesthesia Quick Evaluation

## 2017-08-14 NOTE — Transfer of Care (Signed)
Immediate Anesthesia Transfer of Care Note  Patient: Julie [name]  Procedure(s) Performed: ESOPHAGOGASTRODUODENOSCOPY (EGD) WITH PROPOFOL (N/A ) BIOPSY  Patient Location: PACU  Anesthesia Type:MAC  Level of Consciousness: awake and patient cooperative  Airway & Oxygen Therapy: Patient Spontanous Breathing and Patient connected to nasal cannula oxygen  Post-op Assessment: Report given to RN, Post -op Vital signs reviewed and stable and Patient moving all extremities  Post vital signs: Reviewed and stable  Last Vitals:  Vitals Value Taken Time  BP    Temp    Pulse 71 08/14/2017  7:56 AM  Resp    SpO2 89 % 08/14/2017  7:56 AM  Vitals shown include unvalidated device data.  Last Pain:  Vitals:   08/14/17 0654  TempSrc: Oral  PainSc: 4       Patients Stated Pain Goal: 6 (46/50/35 4656)  Complications: No apparent anesthesia complications

## 2017-08-14 NOTE — H&P (Signed)
Primary Care Physician:  [facility] Primary Gastroenterologist:  Dr. Darrick PennaFields  Pre-Procedure History & Physical: HPI:  Julie [name] is a 47 y.o. female here for DYSPEPSIA.  Past Medical History:  Diagnosis Date  . Anxiety   . Arthritis    Right hand ring finger  . Bad odor of urine 09/09/2015  . Complication of anesthesia   . GERD (gastroesophageal reflux disease)   . Headache   . PONV (postoperative nausea and vomiting)     Past Surgical History:  Procedure Laterality Date  . ABDOMINAL HYSTERECTOMY    . BLADDER SURGERY    . CLOSED REDUCTION FINGER WITH PERCUTANEOUS PINNING Right    Ring finger  . COLONOSCOPY WITH PROPOFOL N/A 01/16/2017   Procedure: COLONOSCOPY WITH PROPOFOL;  Surgeon: West BaliFields, Shaydon Lease L, MD;  [facility];  Service: Endoscopy;  Laterality: N/A;  730     Prior to Admission medications   Medication Sig Start Date End Date Taking? Authorizing Provider  busPIRone (BUSPAR) 5 MG tablet Take 1 tablet (5 mg total) by mouth 3 (three) times daily. 08/01/17  Yes Adline PotterGriffin, Jennifer A, NP  dicyclomine (BENTYL) 20 MG tablet Take 1 tablet (20 mg total) by mouth every 6 (six) hours as needed for spasms (abdominal cramping). 05/18/17  Yes Samuel JesterMcManus, Kathleen, DO  HYDROcodone-acetaminophen (NORCO/VICODIN) 5-325 MG tablet Take 1 tablet by mouth every 4 (four) hours as needed. 08/12/17  Yes Idol, Raynelle FanningJulie, PA-C  ibuprofen (ADVIL,MOTRIN) 600 MG tablet Take 1 tablet (600 mg total) by mouth every 6 (six) hours as needed. 08/12/17  Yes Idol, Raynelle FanningJulie, PA-C  LORazepam (ATIVAN) 0.5 MG tablet Take 1 tablet (0.5 mg total) by mouth 2 (two) times daily as needed. 08/01/17  Yes Adline PotterGriffin, Jennifer A, NP  methocarbamol (ROBAXIN) 500 MG tablet Take 1 tablet (500 mg total) by mouth 4 (four) times daily for 10 days. 08/12/17 08/22/17 Yes Idol, Raynelle FanningJulie, PA-C  Multiple Vitamin (MULTIVITAMIN WITH MINERALS) TABS tablet Take 1 tablet by mouth daily.   Yes [provider]   pantoprazole (PROTONIX) 40 MG tablet Take 1 tablet (40 mg total) by mouth 2 (two) times daily before a meal. 08/01/17  Yes Tiffany KocherLewis, Leslie S, PA-C  metoCLOPramide (REGLAN) 10 MG tablet Take 1 tablet (10 mg total) by mouth every 6 (six) hours as needed for nausea (or headache). Patient not taking: Reported on 07/31/2017 05/18/17   Samuel JesterMcManus, Kathleen, DO    Allergies as of 06/26/2017 - Review Complete 06/26/2017  Allergen Reaction Noted  . Demerol [meperidine] Nausea And Vomiting 11/21/2011    Family History  Adopted: Yes  Problem Relation Age of Onset  . Irritable bowel syndrome Son   . Colon cancer Neg Hx   . Gastric cancer Neg Hx   . Esophageal cancer Neg Hx     Social History   Socioeconomic History  . Marital status: Divorced    Spouse name: Not on file  . Number of children: Not on file  . Years of education: Not on file  . Highest education level: Not on file  Occupational History  . Not on file  Social Needs  . Financial resource strain: Not on file  . Food insecurity:    Worry: Not on file    Inability: Not on file  . Transportation needs:    Medical: Not on file    Non-medical: Not on file  Tobacco Use  . Smoking status: Never Smoker  . Smokeless tobacco: Never Used  Substance and Sexual Activity  .  Alcohol use: Yes    Comment: occassionally  . Drug use: No  . Sexual activity: Yes    Birth control/protection: Surgical    Comment: hyst  Lifestyle  . Physical activity:    Days per week: Not on file    Minutes per session: Not on file  . Stress: Not on file  Relationships  . Social connections:    Talks on phone: Not on file    Gets together: Not on file    Attends religious service: Not on file    Active member of club or organization: Not on file    Attends meetings of clubs or organizations: Not on file    Relationship status: Not on file  . Intimate partner violence:    Fear of current or ex partner: Not on file    Emotionally abused: Not on file     Physically abused: Not on file    Forced sexual activity: Not on file  Other Topics Concern  . Not on file  Social History Narrative  . Not on file    Review of Systems: See HPI, otherwise negative ROS   Physical Exam: BP 117/78   Pulse 60   Temp 97.7 F (36.5 C) (Oral)   Resp 15   Ht 5\' 10"  (1.778 m)   Wt 162 lb (73.5 kg)   SpO2 98%   BMI 23.24 kg/m  General:   Alert,  pleasant and cooperative in NAD Head:  Normocephalic and atraumatic. Neck:  Supple; Lungs:  Clear throughout to auscultation.    Heart:  Regular rate and rhythm. Abdomen:  Soft, nontender and nondistended. Normal bowel sounds, without guarding, and without rebound.   Neurologic:  Alert and  oriented x4;  grossly normal neurologically.  Impression/Plan:     DYSPEPSIA  PLAN:  EGD TODAY. DISCUSSED PROCEDURE, BENEFITS, & RISKS: < 1% chance of medication reaction, bleeding, OR perforation.

## 2017-08-17 ENCOUNTER — Emergency Department (HOSPITAL_COMMUNITY)
Admission: EM | Admit: 2017-08-17 | Discharge: 2017-08-17 | Disposition: A | Discharge status: Home / Self Care | Payer: BLUE CROSS/BLUE SHIELD | Attending: Emergency Medicine | Admitting: Emergency Medicine

## 2017-08-17 ENCOUNTER — Encounter (HOSPITAL_COMMUNITY): Payer: Self-pay

## 2017-08-17 DIAGNOSIS — M5432 Sciatica, left side: Secondary | ICD-10-CM | POA: Diagnosis not present

## 2017-08-17 DIAGNOSIS — F419 Anxiety disorder, unspecified: Secondary | ICD-10-CM | POA: Insufficient documentation

## 2017-08-17 DIAGNOSIS — M549 Dorsalgia, unspecified: Secondary | ICD-10-CM | POA: Diagnosis not present

## 2017-08-17 DIAGNOSIS — M25552 Pain in left hip: Secondary | ICD-10-CM | POA: Diagnosis not present

## 2017-08-17 MED ORDER — ACETAMINOPHEN 325 MG PO TABS
650.0000 mg | ORAL_TABLET | Freq: Once | ORAL | Status: AC
  Administered 2017-08-17: 650 mg via ORAL
  Filled 2017-08-17: qty 2

## 2017-08-17 MED ORDER — METHOCARBAMOL 500 MG PO TABS
500.0000 mg | ORAL_TABLET | Freq: Once | ORAL | Status: AC
  Administered 2017-08-17: 500 mg via ORAL
  Filled 2017-08-17: qty 1

## 2017-08-17 MED ORDER — PREDNISONE 20 MG PO TABS: ORAL_TABLET | ORAL | 0 refills | Status: DC

## 2017-08-17 MED ORDER — GABAPENTIN 100 MG PO CAPS: 100.0000 mg | ORAL_CAPSULE | Freq: Three times a day (TID) | ORAL | 0 refills | Status: DC

## 2017-08-17 MED ORDER — METHYLPREDNISOLONE SODIUM SUCC 125 MG IJ SOLR
125.0000 mg | Freq: Once | INTRAMUSCULAR | Status: AC
  Administered 2017-08-17: 125 mg via INTRAMUSCULAR
  Filled 2017-08-17: qty 2

## 2017-08-17 MED ORDER — HYDROMORPHONE HCL 1 MG/ML IJ SOLN
1.0000 mg | Freq: Once | INTRAMUSCULAR | Status: AC
  Administered 2017-08-17: 1 mg via INTRAMUSCULAR
  Filled 2017-08-17: qty 1

## 2017-08-17 MED ORDER — METHOCARBAMOL 500 MG PO TABS: 500.0000 mg | ORAL_TABLET | Freq: Four times a day (QID) | ORAL | 0 refills | Status: DC | PRN

## 2017-08-17 MED ORDER — GABAPENTIN 100 MG PO CAPS
100.0000 mg | ORAL_CAPSULE | Freq: Once | ORAL | Status: AC
  Administered 2017-08-17: 100 mg via ORAL
  Filled 2017-08-17: qty 1

## 2017-08-17 MED ORDER — ACETAMINOPHEN 500 MG PO TABS: 1000.0000 mg | ORAL_TABLET | Freq: Three times a day (TID) | ORAL | Status: DC

## 2017-08-17 NOTE — ED Provider Notes (Signed)
Emergency Department Provider Note   I have reviewed the triage vital signs and the nursing notes.   HISTORY  Chief Complaint Hip Pain   HPI Julie [name] is a 47 y.o. female without significant medical history who presents to the emergency department today with progressively worsening left-sided back pain that now radiates down into her left hip.  She states that the radiation is new since this morning.  It feels like sharp stabbing pins-and-needles that radiates from her left back down to anterior hip area.  CT scan done when she was here last time that did not show any intra-abdominal process but did show some L5-S1 degenerative changes.  States she has been doing Robaxin 4 times a day without relief heat and cold as well.  States is worse when laying in certain positions or move in certain ways relieved when moving certain ways but she is not sure what those are.no fevers or urinary symptoms. No rashes. No urinary incontinence, lower extremity weakness or numbness. No midline back pain, fevers.  No other associated or modifying symptoms.    Past Medical History:  Diagnosis Date  . Anxiety   . Arthritis    Right hand ring finger  . Bad odor of urine 09/09/2015  . Complication of anesthesia   . GERD (gastroesophageal reflux disease)   . Headache   . PONV (postoperative nausea and vomiting)     Patient Active Problem List   Diagnosis Date Noted  . Dyspepsia 06/26/2017  . Chest pain 06/26/2017  . Screening for genitourinary condition 09/13/2016  . Heme positive stool 09/13/2016  . Bad odor of urine 09/09/2015  . Anxiety 09/17/2013    Past Surgical History:  Procedure Laterality Date  . ABDOMINAL HYSTERECTOMY    . BLADDER SURGERY    . CLOSED REDUCTION FINGER WITH PERCUTANEOUS PINNING Right    Ring finger  . COLONOSCOPY WITH PROPOFOL N/A 01/16/2017   Procedure: COLONOSCOPY WITH PROPOFOL;  Surgeon: West Bali, MD;  [facility];  Service: Endoscopy;   Laterality: N/A;  730     Current Outpatient Rx  . Order #: [id number] Class: Normal  . Order #: [id number] Class: Print  . Order #: [id number] Class: Print  . Order #: [id number] Class: Print  . Order #: [id number] Class: Print  . Order #: [id number] Class: Normal  . Order #: [id number] Class: Print  . Order #: [id number] Class: Print  . Order #: [id number] Class: Print  . Order #: [id number] Class: Historical Med  . Order #: [id number] Class: Normal  . Order #: [id number] Class: Print    Allergies Demerol [meperidine] and Ibuprofen  Family History  Adopted: Yes  Problem Relation Age of Onset  . Irritable bowel syndrome Son   . Colon cancer Neg Hx   . Gastric cancer Neg Hx   . Esophageal cancer Neg Hx     Social History Social History   Tobacco Use  . Smoking status: Never Smoker  . Smokeless tobacco: Never Used  Substance Use Topics  . Alcohol use: Yes    Comment: occassionally  . Drug use: No    Review of Systems  All other systems negative except as documented in the HPI. All pertinent positives and negatives as reviewed in the HPI. ____________________________________________   PHYSICAL EXAM:  VITAL SIGNS: ED Triage Vitals  Enc Vitals Group     BP 08/17/17 1141 106/81     Pulse Rate 08/17/17 1141 82     Resp 08/17/17 1141 (!) 22     Temp 08/17/17 1141  98.3 F (36.8 C)     Temp Source 08/17/17 1141 Oral     SpO2 08/17/17 1141 100 %     Weight 08/17/17 1143 162 lb (73.5 kg)    Constitutional: Alert and oriented. Well appearing and in no acute distress. Eyes: Conjunctivae are normal. PERRL. EOMI. Head: Atraumatic. Nose: No congestion/rhinnorhea. Mouth/Throat: Mucous membranes are moist.  Oropharynx non-erythematous. Neck: No stridor.  No meningeal signs.   Cardiovascular: Normal rate, regular rhythm. Good peripheral circulation. Grossly normal heart sounds.   Respiratory: Normal respiratory effort.  No retractions. Lungs CTAB. Gastrointestinal: Soft and nontender. No  distention.  Musculoskeletal: No lower extremity tenderness nor edema. No gross deformities of extremities. Neurologic:  Normal speech and language. No gross focal neurologic deficits are appreciated.  Skin:  Skin is warm, dry and intact. No rash noted.  ____________________________________________     INITIAL IMPRESSION / ASSESSMENT AND PLAN / ED COURSE  S/s c/w likely sciatica.  Neurologically intact able to walk on it without any gait abnormalities. No indication for emergent imaging but may need outpatient MRi if not improving.   I suspect that the steroid taper will help the most but will refer to neurosurgery just in case it does not.  We will also continue her restrictions at work.  Advised NO bedrest.   Pertinent labs & imaging results that were available during my care of the patient were reviewed by me and considered in my medical decision making (see chart for details).  ____________________________________________  FINAL CLINICAL IMPRESSION(S) / ED DIAGNOSES  Final diagnoses:  Left hip pain  Sciatica of left side     MEDICATIONS GIVEN DURING THIS VISIT:  Medications  methylPREDNISolone sodium succinate (SOLU-MEDROL) 125 mg/2 mL injection 125 mg (has no administration in time range)  HYDROmorphone (DILAUDID) injection 1 mg (has no administration in time range)  acetaminophen (TYLENOL) tablet 650 mg (has no administration in time range)  methocarbamol (ROBAXIN) tablet 500 mg (has no administration in time range)  gabapentin (NEURONTIN) capsule 100 mg (has no administration in time range)  acetaminophen (TYLENOL) tablet 1,000 mg (has no administration in time range)     NEW OUTPATIENT MEDICATIONS STARTED DURING THIS VISIT:  New Prescriptions   GABAPENTIN (NEURONTIN) 100 MG CAPSULE    Take 1 capsule (100 mg total) by mouth 3 (three) times daily. Until pain improves   METHOCARBAMOL (ROBAXIN) 500 MG TABLET    Take 1 tablet (500 mg total) by mouth every 6 (six) hours  as needed for muscle spasms.   PREDNISONE (DELTASONE) 20 MG TABLET    3 tabs po daily x 3 days, then 2 tabs x 3 days, then 1.5 tabs x 3 days, then 1 tab x 3 days, then 0.5 tabs x 3 days    Note:  This note was prepared with assistance of Dragon voice recognition software. Occasional wrong-word or sound-a-like substitutions may have occurred due to the inherent limitations of voice recognition software.   Marily MemosMesner, Donnavin Vandenbrink, MD 08/17/17 [phone]

## 2017-08-17 NOTE — ED Triage Notes (Addendum)
Pt report being seen Sunday due left side pain. Reports that she woke up pain from left side that radiates to left hip. Hip is numb and pain is severe. Pt reports taking Robaxin this am without relief

## 2017-08-20 NOTE — Progress Notes (Signed)
PT is aware. Said she has not had much chest pain and feels she is better. She is aware of Dr. Evelina DunField's recommendation and will see cardiology if she has the need.

## 2017-08-23 ENCOUNTER — Telehealth: Payer: Self-pay | Admitting: Gastroenterology

## 2017-08-23 MED ORDER — DICYCLOMINE HCL 20 MG PO TABS: 20.0000 mg | ORAL_TABLET | Freq: Four times a day (QID) | ORAL | 1 refills | Status: DC | PRN

## 2017-08-23 NOTE — Telephone Encounter (Signed)
Rx printed and I have faxed to [facility] and pt is aware.

## 2017-08-23 NOTE — Telephone Encounter (Signed)
Forwarding to Wynne DustEric Gill, NP who last saw the pt in the office on 06/26/2017.

## 2017-08-23 NOTE — Telephone Encounter (Signed)
Please tell the patient prescription was sent to the pharmacy per her request.

## 2017-08-23 NOTE — Telephone Encounter (Signed)
Pt was calling to see if she could get a prescription of dicyclomine 20 mg sent into [facility]. The ER had given her some and it's helping her with muscle spasms in her stomach and only has a few left.

## 2017-08-24 ENCOUNTER — Encounter (HOSPITAL_COMMUNITY): Payer: Self-pay | Admitting: Gastroenterology

## 2017-09-18 ENCOUNTER — Other Ambulatory Visit: Payer: Self-pay | Admitting: Adult Health

## 2017-09-18 DIAGNOSIS — Z1231 Encounter for screening mammogram for malignant neoplasm of breast: Secondary | ICD-10-CM

## 2017-09-25 ENCOUNTER — Ambulatory Visit: Payer: Self-pay | Admitting: Nurse Practitioner

## 2017-09-26 ENCOUNTER — Ambulatory Visit: Payer: Self-pay | Admitting: Nurse Practitioner

## 2017-10-01 ENCOUNTER — Ambulatory Visit: Payer: BLUE CROSS/BLUE SHIELD | Admitting: Nurse Practitioner

## 2017-10-01 ENCOUNTER — Ambulatory Visit (HOSPITAL_COMMUNITY)
Admission: RE | Admit: 2017-10-01 | Discharge: 2017-10-01 | Disposition: A | Discharge status: Home / Self Care | Payer: BLUE CROSS/BLUE SHIELD | Source: Ambulatory Visit | Attending: Adult Health | Admitting: Adult Health

## 2017-10-01 ENCOUNTER — Encounter: Payer: Self-pay | Admitting: Nurse Practitioner

## 2017-10-01 ENCOUNTER — Encounter (HOSPITAL_COMMUNITY): Payer: Self-pay

## 2017-10-01 VITALS — BP 115/71 | HR 64 | Temp 97.1°F | Ht 70.0 in | Wt 163.0 lb

## 2017-10-01 DIAGNOSIS — Z1231 Encounter for screening mammogram for malignant neoplasm of breast: Secondary | ICD-10-CM

## 2017-10-01 DIAGNOSIS — R1013 Epigastric pain: Secondary | ICD-10-CM

## 2017-10-01 DIAGNOSIS — K5900 Constipation, unspecified: Secondary | ICD-10-CM | POA: Diagnosis not present

## 2017-10-01 DIAGNOSIS — K2990 Gastroduodenitis, unspecified, without bleeding: Secondary | ICD-10-CM

## 2017-10-01 DIAGNOSIS — K2970 Gastritis, unspecified, without bleeding: Secondary | ICD-10-CM | POA: Diagnosis not present

## 2017-10-01 NOTE — Assessment & Plan Note (Signed)
EGD with gastritis/duodenitis.  This is likely causing her upper GI symptoms.  She is on twice daily PPI.  No need for surveillance EGD at this time.  Recommend she continue her current medications, follow-up in 3 months.

## 2017-10-01 NOTE — Assessment & Plan Note (Signed)
Rare intermittent constipation symptoms.  She drinks significant water, will add additional fiber foods when she has a day with constipation.  I recommended that she take MiraLAX 1-2 times a day as needed on days that she might feel constipated.  This typically limited to once every couple few weeks.  If this causes significant gas, she can stop MiraLAX and try daily Colace as needed.  Follow-up in 3 months.

## 2017-10-01 NOTE — Progress Notes (Signed)
Referring Provider: [facility] Primary Care Physician:  [facility] Primary GI:  Dr. Darrick Penna  Chief Complaint  Patient presents with  . dyspepsia    f/u, doing ok    HPI:   Julie [name] is a 47 y.o. female who presents to follow-up on dyspepsia.  The patient was last seen in the office 06/26/2017 for the same as well as chest pain.  Previous colonoscopy dated 01/16/2017 found heme positive stools due to internal hemorrhoids and recommended 10-year repeat exam (2028).  Recently seen in the emergency room 05/18/2017 for abdominal pain, chest pain, and other complaints.  GI and chest pain gradual and persistent onset since day of presentation.  Labs are normal, discharge home recommended GI follow-up and placed on famotidine 20 mg twice daily.  At her last visit she stated that she was not so good "that day." The previous evening had tuna and crackers which caused significant symptoms.  Famotidine not effective and was put on Protonix.  She has been avoiding greasy and spicy foods.  Has tried Tums, milk, baking soda which helped enough for her to go to sleep but awoke with morning time symptoms.  Noted left upper quadrant pain, chest/esophageal pain, sour taste with belching.  No other GI symptoms.  Protonix noted to be somewhat effective but not great and still with breakthrough a couple times a week.  Recommended EGD, increase Protonix to twice daily, follow-up in 3 months, call primary care to discuss possible need for cardiology referral.  EGD completed on propofol on 08/14/2017 which found normal esophagus, multiple gastric polyps, mild gastritis/duodenitis.  Recommended trigger avoidance, continue present medications, add viscous lidocaine as needed.  Follow-up in 3 months.  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  Gastric biopsies indicating mild gastritis, duodenal biopsies normal.  Recommended follow-up with cardiology for  chest pain.  Strict avoidance of triggers.  Follow-up in 3 months.  Today she states she's doing better. Still on Protonix twice daily. Is avoiding trigger foods. Has only had breakthrough GERD 1-2 times since her EGD. She takes TUMS for breakthrough, which helps. Not drinking sodas. Has been avoiding caffeine. Is wanting to try some things (like coffee) in moderation. Denies NSAIDs and ASA powders. Denies ongoing abdominal pain, N/V, hematochezia, melena, fever, chills, unintentional weight loss. Does have intermittent/rare constipation. Denies chest pain, dyspnea, dizziness, lightheadedness, syncope, near syncope. Denies any other upper or lower GI symptoms.  Past Medical History:  Diagnosis Date  . Anxiety   . Arthritis    Right hand ring finger  . Bad odor of urine 09/09/2015  . Complication of anesthesia   . GERD (gastroesophageal reflux disease)   . Headache   . PONV (postoperative nausea and vomiting)     Past Surgical History:  Procedure Laterality Date  . ABDOMINAL HYSTERECTOMY    . BIOPSY  08/14/2017   Procedure: BIOPSY;  Surgeon: West Bali, MD;  [facility];  Service: Endoscopy;;  duodenum gastric  . BLADDER SURGERY    . CLOSED REDUCTION FINGER WITH PERCUTANEOUS PINNING Right    Ring finger  . COLONOSCOPY WITH PROPOFOL N/A 01/16/2017   Procedure: COLONOSCOPY WITH PROPOFOL;  Surgeon: West Bali, MD;  [facility];  Service: Endoscopy;  Laterality: N/A;  730   . ESOPHAGOGASTRODUODENOSCOPY (EGD) WITH PROPOFOL N/A 08/14/2017   Procedure: ESOPHAGOGASTRODUODENOSCOPY (EGD) WITH PROPOFOL;  Surgeon: West Bali, MD;  [facility];  Service: Endoscopy;  Laterality: N/A;  7:30am    Current Outpatient Medications  Medication Sig Dispense Refill  . busPIRone (BUSPAR) 5 MG tablet Take 1 tablet (5 mg total) by mouth 3 (three) times daily. 90 tablet 3  . dicyclomine (BENTYL) 20 MG tablet Take 1 tablet (20 mg total) by mouth every 6 (six)  hours as needed for spasms (abdominal cramping). 30 tablet 1  . lidocaine (XYLOCAINE) 2 % solution 2 TSP PO WHEN NEDED FOR ABDOMINAL/CHEST PAIN. REPEAT DOSE Q4H. NO MORE> 8 DOSES/DAY. 300 mL 5  . LORazepam (ATIVAN) 0.5 MG tablet Take 1 tablet (0.5 mg total) by mouth 2 (two) times daily as needed. 60 tablet 1  . methocarbamol (ROBAXIN) 500 MG tablet Take 1 tablet (500 mg total) by mouth every 6 (six) hours as needed for muscle spasms. 20 tablet 0  . Multiple Vitamin (MULTIVITAMIN WITH MINERALS) TABS tablet Take 1 tablet by mouth daily.    . pantoprazole (PROTONIX) 40 MG tablet Take 1 tablet (40 mg total) by mouth 2 (two) times daily before a meal. 60 tablet 5   No current facility-administered medications for this visit.     Allergies as of 10/01/2017 - Review Complete 10/01/2017  Allergen Reaction Noted  . Demerol [meperidine] Nausea And Vomiting 11/21/2011  . Ibuprofen  08/17/2017    Family History  Adopted: Yes  Problem Relation Age of Onset  . Irritable bowel syndrome Son   . Colon cancer Neg Hx   . Gastric cancer Neg Hx   . Esophageal cancer Neg Hx     Social History   Socioeconomic History  . Marital status: Divorced    Spouse name: Not on file  . Number of children: Not on file  . Years of education: Not on file  . Highest education level: Not on file  Occupational History  . Not on file  Social Needs  . Financial resource strain: Not on file  . Food insecurity:    Worry: Not on file    Inability: Not on file  . Transportation needs:    Medical: Not on file    Non-medical: Not on file  Tobacco Use  . Smoking status: Never Smoker  . Smokeless tobacco: Never Used  Substance and Sexual Activity  . Alcohol use: Yes    Comment: occassionally  . Drug use: No  . Sexual activity: Yes    Birth control/protection: Surgical    Comment: hyst  Lifestyle  . Physical activity:    Days per week: Not on file    Minutes per session: Not on file  . Stress: Not on file    Relationships  . Social connections:    Talks on phone: Not on file    Gets together: Not on file    Attends religious service: Not on file    Active member of club or organization: Not on file    Attends meetings of clubs or organizations: Not on file    Relationship status: Not on file  Other Topics Concern  . Not on file  Social History Narrative  . Not on file    Review of Systems: Complete ROS negative except as per HPI.   Physical Exam: BP 115/71   Pulse 64   Temp (!) 97.1 F (36.2 C) (Oral)   Ht 5\' 10"  (1.778 m)   Wt 163 lb (73.9 kg)   BMI 23.39 kg/m  General:   Alert and oriented. Pleasant and cooperative. Well-nourished and well-developed.  Eyes:  Without icterus, sclera clear and conjunctiva pink.  Ears:  Normal auditory acuity. Cardiovascular:  S1, S2 present without murmurs appreciated. Extremities without clubbing or edema. Respiratory:  Clear to auscultation bilaterally. No wheezes, rales, or rhonchi. No distress.  Gastrointestinal:  +BS, soft, non-tender and non-distended. No HSM noted. No guarding or rebound. No masses appreciated.  Rectal:  Deferred  Musculoskalatal:  Symmetrical without gross deformities. Neurologic:  Alert and oriented x4;  grossly normal neurologically. Psych:  Alert and cooperative. Normal mood and affect. Heme/Lymph/Immune: No excessive bruising noted.    10/01/2017 10:28 AM   Disclaimer: This note was dictated with voice recognition software. Similar sounding words can inadvertently be transcribed and may not be corrected upon review.

## 2017-10-01 NOTE — Progress Notes (Signed)
CC'ED TO PCP 

## 2017-10-01 NOTE — Patient Instructions (Signed)
1. As we discussed, you can try some of your trigger foods in moderation to see how you handle them. 2. Again, just know if your symptoms worsen then you may have to either eliminate the trigger foods again or make a decision if the heartburn symptoms are worth it. 3. Continue your current medications of twice a day for now with Tums intermittently if needed. 4. You can try MiraLAX 1-2 times a day, as needed, for mild constipation.  If this causes excessive gas, try over-the-counter Colace once daily as needed. 5. Follow-up in 3 months.  At that time we can decide if we want to try to back off to Protonix once a day. 6. Call us if you have any questions or concerns.  At [facility] we value your feedback. You may receive a survey about your visit today. Please share your experience as we strive to create trusting relationships with our patients to provide genuine, compassionate, quality care.  It was great to see you today, I am glad you are feeling better!  I hope you have a wonderful summer!!

## 2017-10-01 NOTE — Assessment & Plan Note (Signed)
Her symptoms are significantly improved on twice daily Protonix.  Has rare/intermittent breakthrough for which she takes Tums over-the-counter and helps.  She is wanting to try 1 or 2 of her trigger foods, and moderation, to see if she is able to tolerate these; specifically, coffee once a day.  I discussed that if her symptoms return it is likely due to these and she would have to try to quit them again versus deal with the dyspepsia symptoms.  She is in agreement.  Follow-up in 3 months to see how she is doing.  If her symptoms continue to be doing well, we can consider backing off to once a day PPI.  I am hesitant to change her medication today due to her plan to add intermittent trigger foods, to avoid muddying the waters.

## 2017-10-04 ENCOUNTER — Encounter: Payer: Self-pay | Admitting: Adult Health

## 2017-10-04 ENCOUNTER — Ambulatory Visit (INDEPENDENT_AMBULATORY_CARE_PROVIDER_SITE_OTHER): Payer: BLUE CROSS/BLUE SHIELD | Admitting: Adult Health

## 2017-10-04 VITALS — BP 118/62 | HR 71 | Ht 70.0 in | Wt 164.0 lb

## 2017-10-04 DIAGNOSIS — Z1211 Encounter for screening for malignant neoplasm of colon: Secondary | ICD-10-CM

## 2017-10-04 DIAGNOSIS — Z01411 Encounter for gynecological examination (general) (routine) with abnormal findings: Secondary | ICD-10-CM | POA: Diagnosis not present

## 2017-10-04 DIAGNOSIS — R5383 Other fatigue: Secondary | ICD-10-CM | POA: Diagnosis not present

## 2017-10-04 DIAGNOSIS — L918 Other hypertrophic disorders of the skin: Secondary | ICD-10-CM

## 2017-10-04 DIAGNOSIS — Z1212 Encounter for screening for malignant neoplasm of rectum: Secondary | ICD-10-CM | POA: Diagnosis not present

## 2017-10-04 DIAGNOSIS — Z13220 Encounter for screening for lipoid disorders: Secondary | ICD-10-CM | POA: Diagnosis not present

## 2017-10-04 DIAGNOSIS — F419 Anxiety disorder, unspecified: Secondary | ICD-10-CM | POA: Diagnosis not present

## 2017-10-04 DIAGNOSIS — Z1329 Encounter for screening for other suspected endocrine disorder: Secondary | ICD-10-CM | POA: Insufficient documentation

## 2017-10-04 DIAGNOSIS — Z01419 Encounter for gynecological examination (general) (routine) without abnormal findings: Secondary | ICD-10-CM

## 2017-10-04 LAB — HEMOCCULT GUIAC POC 1CARD (OFFICE): FECAL OCCULT BLD: NEGATIVE

## 2017-10-04 NOTE — Progress Notes (Signed)
Patient ID: Julie [name], female   DOB: 07/06/1970, 47 y.o.   MRN: [id number] History of Present Illness: Julie [name] is a 47 year old white female,divorced, sp hysterectomy in for well woman gyn exam. [facility].    Current Medications, Allergies, Past Medical History, Past Surgical History, Family History and Social History were reviewed in [facility] electronic medical record.     Review of Systems: Patient denies any headaches, hearing loss, blurred vision, shortness of breath, chest pain, abdominal pain, problems with bowel movements, urination, or intercourse. No joint pain or mood swings. +tired at times.    Physical Exam:BP 118/62 (BP Location: Left Arm, Patient Position: Sitting, Cuff Size: Small)   Pulse 71   Ht 5\' 10"  (1.778 m)   Wt 164 lb (74.4 kg)   BMI 23.53 kg/m  General:  Well developed, well nourished, no acute distress Skin:  Warm and dry Neck:  Midline trachea, normal thyroid, good ROM, no lymphadenopathy Lungs; Clear to auscultation bilaterally Breast:  No dominant palpable mass, retraction, or nipple discharge Cardiovascular: Regular rate and rhythm Abdomen:  Soft, non tender, no hepatosplenomegaly Pelvic:  External genitalia is normal in appearance, no lesions.  Skin tag left thigh. The vagina is normal in appearance. Urethra has no lesions or masses. The cervix and uterus are absent.  No adnexal masses or tenderness noted.Bladder is non tender, no masses felt. Rectal: Good sphincter tone, no polyps, or hemorrhoids felt.  Hemoccult negative. Extremities/musculoskeletal:  No swelling or varicosities noted, no clubbing or cyanosis Psych:  No mood changes, alert and cooperative,seems happy PHQ 9 score 0.   Impression: 1. Well woman exam with routine gynecological exam   2. Screening for colorectal cancer   3. Anxiety   4. Skin tag   5. Screening cholesterol level   6. Screening for thyroid disorder   7. Tired       Plan: Check CBC,CMP,TSH and  lipids Has refills on ativan Physical in 1 year Mammogram yearly

## 2017-10-05 ENCOUNTER — Other Ambulatory Visit: Payer: BLUE CROSS/BLUE SHIELD | Admitting: Adult Health

## 2017-10-08 DIAGNOSIS — R5383 Other fatigue: Secondary | ICD-10-CM | POA: Diagnosis not present

## 2017-10-08 DIAGNOSIS — Z01419 Encounter for gynecological examination (general) (routine) without abnormal findings: Secondary | ICD-10-CM | POA: Diagnosis not present

## 2017-10-08 DIAGNOSIS — Z13220 Encounter for screening for lipoid disorders: Secondary | ICD-10-CM | POA: Diagnosis not present

## 2017-10-08 DIAGNOSIS — Z1329 Encounter for screening for other suspected endocrine disorder: Secondary | ICD-10-CM | POA: Diagnosis not present

## 2017-10-09 ENCOUNTER — Telehealth: Payer: Self-pay | Admitting: Adult Health

## 2017-10-09 LAB — CBC
HEMATOCRIT: 40.4 % (ref 34.0–46.6)
HEMOGLOBIN: 14 g/dL (ref 11.1–15.9)
MCH: 31.4 pg (ref 26.6–33.0)
MCHC: 34.7 g/dL (ref 31.5–35.7)
MCV: 91 fL (ref 79–97)
Platelets: 238 10*3/uL (ref 150–450)
RBC: 4.46 x10E6/uL (ref 3.77–5.28)
RDW: 14 % (ref 12.3–15.4)
WBC: 7.2 10*3/uL (ref 3.4–10.8)

## 2017-10-09 LAB — TSH: TSH: 1.58 u[IU]/mL (ref 0.450–4.500)

## 2017-10-09 LAB — COMPREHENSIVE METABOLIC PANEL
ALBUMIN: 4.6 g/dL (ref 3.5–5.5)
ALK PHOS: 67 IU/L (ref 39–117)
ALT: 13 IU/L (ref 0–32)
AST: 14 IU/L (ref 0–40)
Albumin/Globulin Ratio: 2.6 — ABNORMAL HIGH (ref 1.2–2.2)
BUN / CREAT RATIO: 14 (ref 9–23)
BUN: 12 mg/dL (ref 6–24)
Bilirubin Total: 0.5 mg/dL (ref 0.0–1.2)
CO2: 24 mmol/L (ref 20–29)
CREATININE: 0.83 mg/dL (ref 0.57–1.00)
Calcium: 9.6 mg/dL (ref 8.7–10.2)
Chloride: 102 mmol/L (ref 96–106)
GFR calc non Af Amer: 84 mL/min/{1.73_m2} (ref >59)
GFR, EST AFRICAN AMERICAN: 96 mL/min/{1.73_m2} (ref >59)
GLOBULIN, TOTAL: 1.8 g/dL (ref 1.5–4.5)
Glucose: 100 mg/dL — ABNORMAL HIGH (ref 65–99)
Potassium: 4.5 mmol/L (ref 3.5–5.2)
SODIUM: 142 mmol/L (ref 134–144)
TOTAL PROTEIN: 6.4 g/dL (ref 6.0–8.5)

## 2017-10-09 LAB — LIPID PANEL
CHOL/HDL RATIO: 3.2 ratio (ref 0.0–4.4)
Cholesterol, Total: 165 mg/dL (ref 100–199)
HDL: 52 mg/dL (ref >39)
LDL CALC: 95 mg/dL (ref 0–99)
Triglycerides: 90 mg/dL (ref 0–149)
VLDL CHOLESTEROL CAL: 18 mg/dL (ref 5–40)

## 2017-10-09 NOTE — Telephone Encounter (Signed)
Left message that labs looked good 

## 2017-11-02 DIAGNOSIS — J0140 Acute pansinusitis, unspecified: Secondary | ICD-10-CM | POA: Diagnosis not present

## 2017-11-16 DIAGNOSIS — Z1389 Encounter for screening for other disorder: Secondary | ICD-10-CM | POA: Diagnosis not present

## 2017-11-16 DIAGNOSIS — N3020 Other chronic cystitis without hematuria: Secondary | ICD-10-CM | POA: Diagnosis not present

## 2017-11-16 DIAGNOSIS — R350 Frequency of micturition: Secondary | ICD-10-CM | POA: Diagnosis not present

## 2017-11-16 DIAGNOSIS — N342 Other urethritis: Secondary | ICD-10-CM | POA: Diagnosis not present

## 2017-11-16 DIAGNOSIS — Z6823 Body mass index (BMI) 23.0-23.9, adult: Secondary | ICD-10-CM | POA: Diagnosis not present

## 2017-12-04 DIAGNOSIS — Z1389 Encounter for screening for other disorder: Secondary | ICD-10-CM | POA: Diagnosis not present

## 2017-12-04 DIAGNOSIS — Z6824 Body mass index (BMI) 24.0-24.9, adult: Secondary | ICD-10-CM | POA: Diagnosis not present

## 2017-12-04 DIAGNOSIS — N342 Other urethritis: Secondary | ICD-10-CM | POA: Diagnosis not present

## 2017-12-22 ENCOUNTER — Emergency Department (HOSPITAL_COMMUNITY)
Admission: EM | Admit: 2017-12-22 | Discharge: 2017-12-22 | Disposition: A | Discharge status: Home / Self Care | Payer: Worker's Compensation | Attending: Emergency Medicine | Admitting: Emergency Medicine

## 2017-12-22 ENCOUNTER — Other Ambulatory Visit: Payer: Self-pay

## 2017-12-22 ENCOUNTER — Emergency Department (HOSPITAL_COMMUNITY): Payer: Worker's Compensation

## 2017-12-22 ENCOUNTER — Encounter (HOSPITAL_COMMUNITY): Payer: Self-pay | Admitting: Emergency Medicine

## 2017-12-22 DIAGNOSIS — Y939 Activity, unspecified: Secondary | ICD-10-CM | POA: Diagnosis not present

## 2017-12-22 DIAGNOSIS — Y9289 Other specified places as the place of occurrence of the external cause: Secondary | ICD-10-CM | POA: Insufficient documentation

## 2017-12-22 DIAGNOSIS — Z79899 Other long term (current) drug therapy: Secondary | ICD-10-CM | POA: Insufficient documentation

## 2017-12-22 DIAGNOSIS — S5001XA Contusion of right elbow, initial encounter: Secondary | ICD-10-CM | POA: Diagnosis not present

## 2017-12-22 DIAGNOSIS — Y990 Civilian activity done for income or pay: Secondary | ICD-10-CM | POA: Insufficient documentation

## 2017-12-22 DIAGNOSIS — S5000XA Contusion of unspecified elbow, initial encounter: Secondary | ICD-10-CM

## 2017-12-22 DIAGNOSIS — W19XXXA Unspecified fall, initial encounter: Secondary | ICD-10-CM | POA: Diagnosis not present

## 2017-12-22 DIAGNOSIS — S59901A Unspecified injury of right elbow, initial encounter: Secondary | ICD-10-CM | POA: Diagnosis present

## 2017-12-22 MED ORDER — TRAMADOL HCL 50 MG PO TABS: 50.0000 mg | ORAL_TABLET | Freq: Four times a day (QID) | ORAL | 0 refills | Status: DC | PRN

## 2017-12-22 MED ORDER — TRAMADOL HCL 50 MG PO TABS
100.0000 mg | ORAL_TABLET | Freq: Once | ORAL | Status: AC
Start: 2017-12-22 — End: 2017-12-22
  Administered 2017-12-22: 100 mg via ORAL
  Filled 2017-12-22: qty 2

## 2017-12-22 MED ORDER — ONDANSETRON HCL 4 MG PO TABS
4.0000 mg | ORAL_TABLET | Freq: Once | ORAL | Status: AC
  Administered 2017-12-22: 4 mg via ORAL
  Filled 2017-12-22: qty 1

## 2017-12-22 NOTE — ED Provider Notes (Signed)
[facility] EMERGENCY DEPARTMENT Provider Note   CSN: [id number] Arrival date & time: 12/22/17  1950     History   Chief Complaint Chief Complaint  Patient presents with  . Elbow Pain    HPI Marella BileMichelle L Lorey is a 47 y.o. female.  Patient is a 47 year old female who presents to the emergency department with a complaint of right elbow pain.  The patient states that approximately 5 PM she sustained a fall while she was at work.  She injured the right elbow.  The patient denies injuring any other areas.  In particular she denies hitting her head, injuring her chest, her abdomen, her pelvis, her or her other extremities.  She says that she can move her right upper extremity.  She can flex and extend her elbow, but as time progresses progresses she is noticing more more pain of the elbow area.  She says that it feels like a burning sensation as well as a deep pain.  No previous operations or procedures involving the upper extremities.  The history is provided by the patient.    Past Medical History:  Diagnosis Date  . Anxiety   . Arthritis    Right hand ring finger  . Bad odor of urine 09/09/2015  . Complication of anesthesia   . GERD (gastroesophageal reflux disease)   . Headache   . PONV (postoperative nausea and vomiting)     Patient Active Problem List   Diagnosis Date Noted  . Skin tag 10/04/2017  . Screening for colorectal cancer 10/04/2017  . Well woman exam with routine gynecological exam 10/04/2017  . Tired 10/04/2017  . Screening for thyroid disorder 10/04/2017  . Screening cholesterol level 10/04/2017  . Gastritis and gastroduodenitis 10/01/2017  . Constipation 10/01/2017  . Dyspepsia 06/26/2017  . Chest pain 06/26/2017  . Screening for genitourinary condition 09/13/2016  . Heme positive stool 09/13/2016  . Bad odor of urine 09/09/2015  . Anxiety 09/17/2013    Past Surgical History:  Procedure Laterality Date  . ABDOMINAL HYSTERECTOMY    . BIOPSY   08/14/2017   Procedure: BIOPSY;  Surgeon: West BaliFields, Sandi L, MD;  Location: [facility];  Service: Endoscopy;;  duodenum gastric  . BLADDER SURGERY    . CLOSED REDUCTION FINGER WITH PERCUTANEOUS PINNING Right    Ring finger  . COLONOSCOPY WITH PROPOFOL N/A 01/16/2017   Procedure: COLONOSCOPY WITH PROPOFOL;  Surgeon: West BaliFields, Sandi L, MD;  Location: [facility];  Service: Endoscopy;  Laterality: N/A;  730   . ESOPHAGOGASTRODUODENOSCOPY (EGD) WITH PROPOFOL N/A 08/14/2017   Procedure: ESOPHAGOGASTRODUODENOSCOPY (EGD) WITH PROPOFOL;  Surgeon: West BaliFields, Sandi L, MD;  Location: [facility];  Service: Endoscopy;  Laterality: N/A;  7:30am     OB History    Gravida  2   Para  2   Term      Preterm      AB      Living  2     SAB      TAB      Ectopic      Multiple      Live Births               Home Medications    Prior to Admission medications   Medication Sig Start Date End Date Taking? Authorizing Provider  busPIRone (BUSPAR) 5 MG tablet Take 1 tablet (5 mg total) by mouth 3 (three) times daily. 08/01/17   Adline PotterGriffin, Jennifer A, NP  dicyclomine (BENTYL) 20 MG tablet  Take 1 tablet (20 mg total) by mouth every 6 (six) hours as needed for spasms (abdominal cramping). 08/23/17   Anice Paganini, NP  lidocaine (XYLOCAINE) 2 % solution 2 TSP PO WHEN NEDED FOR ABDOMINAL/CHEST PAIN. REPEAT DOSE Q4H. NO MORE> 8 DOSES/DAY. Patient not taking: Reported on 10/04/2017 08/14/17   West Bali, MD  LORazepam (ATIVAN) 0.5 MG tablet Take 1 tablet (0.5 mg total) by mouth 2 (two) times daily as needed. 08/01/17   Adline Potter, NP  methocarbamol (ROBAXIN) 500 MG tablet Take 1 tablet (500 mg total) by mouth every 6 (six) hours as needed for muscle spasms. Patient not taking: Reported on 10/04/2017 08/17/17   Mesner, Barbara Cower, MD  Multiple Vitamin (MULTIVITAMIN WITH MINERALS) TABS tablet Take 1 tablet by mouth daily.    [provider]  pantoprazole (PROTONIX) 40 MG tablet Take 1 tablet (40  mg total) by mouth 2 (two) times daily before a meal. 08/01/17   Tiffany Kocher, PA-C    Family History Family History  Adopted: Yes  Problem Relation Age of Onset  . Irritable bowel syndrome Son   . Colon cancer Neg Hx   . Gastric cancer Neg Hx   . Esophageal cancer Neg Hx     Social History Social History   Tobacco Use  . Smoking status: Never Smoker  . Smokeless tobacco: Never Used  Substance Use Topics  . Alcohol use: Yes    Comment: occassionally  . Drug use: No     Allergies   Demerol [meperidine] and Ibuprofen   Review of Systems Review of Systems  Constitutional: Negative for activity change.       All ROS Neg except as noted in HPI  HENT: Negative for nosebleeds.   Eyes: Negative for photophobia and discharge.  Respiratory: Negative for cough, shortness of breath and wheezing.   Cardiovascular: Negative for chest pain and palpitations.  Gastrointestinal: Negative for abdominal pain and blood in stool.  Genitourinary: Negative for dysuria, frequency and hematuria.  Musculoskeletal: Positive for arthralgias. Negative for back pain and neck pain.       Elbow pain  Skin: Negative.   Neurological: Negative for dizziness, seizures and speech difficulty.  Psychiatric/Behavioral: Negative for confusion and hallucinations.     Physical Exam Updated Vital Signs BP 135/71 (BP Location: Right Arm)   Pulse 60   Temp 98.2 F (36.8 C) (Oral)   Resp 14   Ht 5\' 10"  (1.778 m)   Wt 72.6 kg   SpO2 100%   BMI 22.96 kg/m   Physical Exam  Constitutional: She is oriented to person, place, and time. She appears well-developed and well-nourished.  Non-toxic appearance.  HENT:  Head: Normocephalic.  Right Ear: Tympanic membrane and external ear normal.  Left Ear: Tympanic membrane and external ear normal.  Eyes: Pupils are equal, round, and reactive to light. EOM and lids are normal.  Neck: Normal range of motion. Neck supple. Carotid bruit is not present.    Cardiovascular: Normal rate, regular rhythm, normal heart sounds, intact distal pulses and normal pulses.  Pulmonary/Chest: Breath sounds normal. No respiratory distress.  Abdominal: Soft. Bowel sounds are normal. There is no tenderness. There is no guarding.  Musculoskeletal: Normal range of motion.  There is full range of motion of the right shoulder.  There is a bruise just above the olecranon of the elbow. The radial pulses 2+.  Capillary refill on the right is less than 2 seconds.  The patient can flex and  extend the fingers without problem, the patient can flex and extend the wrist without problem.  Lymphadenopathy:       Head (right side): No submandibular adenopathy present.       Head (left side): No submandibular adenopathy present.    She has no cervical adenopathy.  Neurological: She is alert and oriented to person, place, and time. She has normal strength. No cranial nerve deficit or sensory deficit.  Skin: Skin is warm and dry.  Psychiatric: She has a normal mood and affect. Her speech is normal.  Nursing note and vitals reviewed.    ED Treatments / Results  Labs (all labs ordered are listed, but only abnormal results are displayed) Labs Reviewed - No data to display  EKG None  Radiology No results found.  Procedures Procedures (including critical care time)  Medications Ordered in ED Medications - No data to display   Initial Impression / Assessment and Plan / ED Course  I have reviewed the triage vital signs and the nursing notes.  Pertinent labs & imaging results that were available during my care of the patient were reviewed by me and considered in my medical decision making (see chart for details).       Final Clinical Impressions(s) / ED Diagnoses MDM  Your vital signs are within normal limits. Your xray is negative for fracture or dislocation. No evidence of fluid in the joint. Your exam favors a contusion/bruise to the elbow.  Pt to be treated  with ACE bandage, tylenol and ultram. Pt will follow up with [facility] for additional follow up if any changes or problem.   Final diagnoses:  Contusion of elbow, unspecified laterality, initial encounter    ED Discharge Orders         Ordered    traMADol (ULTRAM) 50 MG tablet  Every 6 hours PRN     12/22/17 2034           Ivery Quale, PA-C 12/22/17 2050    Mancel Bale, MD 12/23/17 1139

## 2017-12-22 NOTE — Discharge Instructions (Addendum)
Your vital signs are normal. Your xray is negative for fracture or dislocation. Your exam favors a contusion/bruise to the elbow. Please apply the ACE bandage daily. Use tylenol extra strength every 4 hours for pain. Use ultram for more severe pain every 6 hours. See [facility] at [facility] if not improving.

## 2017-12-22 NOTE — ED Triage Notes (Signed)
Pt was at work and fell onto concrete. Bruising and swelling noted to R elbow, no decreased ROM. No OTC medication.

## 2018-01-04 ENCOUNTER — Encounter: Payer: Self-pay | Admitting: Gastroenterology

## 2018-01-04 ENCOUNTER — Ambulatory Visit: Payer: BLUE CROSS/BLUE SHIELD | Admitting: Gastroenterology

## 2018-01-04 VITALS — BP 116/71 | HR 57 | Temp 97.2°F | Ht 70.0 in | Wt 162.8 lb

## 2018-01-04 DIAGNOSIS — K219 Gastro-esophageal reflux disease without esophagitis: Secondary | ICD-10-CM | POA: Diagnosis not present

## 2018-01-04 DIAGNOSIS — K2990 Gastroduodenitis, unspecified, without bleeding: Secondary | ICD-10-CM | POA: Diagnosis not present

## 2018-01-04 DIAGNOSIS — K2970 Gastritis, unspecified, without bleeding: Secondary | ICD-10-CM | POA: Diagnosis not present

## 2018-01-04 NOTE — Progress Notes (Signed)
CC'D TO PCP °

## 2018-01-04 NOTE — Patient Instructions (Signed)
1. Consider dropping back to once daily Protonix if tolerated. If you have recurrent symptoms, you can go back to twice daily if needed. 2. Continue to watch your diet closely and avoid trigger foods as much as possible.  3. Limit aspirin and NSAIDS (ibuprofen/Aleve). 4. Return to the office in six months or call sooner if needed.

## 2018-01-04 NOTE — Progress Notes (Signed)
Primary Care Physician: [facility]  Primary Gastroenterologist:  Barney Drain, MD   Chief Complaint  Patient presents with  . dyspepsia    occ    HPI: Julie [name] is a 47 y.o. female here for follow-up.  She has a history of dyspepsia/chest pain.  EGD in April with normal esophagus, multiple gastric polyps (fundic gland polyps), mild gastritis/duodenitis with no H. pylori.  Small bowel biopsies were unremarkable.  Previous colonoscopy dated 01/16/2017 found heme positive stools due to internal hemorrhoids and recommended 10-year repeat exam (2028) with MAC.  Patient continues to feel well.  She continues pantoprazole twice daily.  She has breakthrough dyspepsia or heartburn approximately once per week.  Trying to avoid trigger foods.  Denies nausea or vomiting.  No postprandial abdominal pain.  Weight is been stable.  She states she often is not very hungry.  She is not sure why.  Labs done back in June reviewed and unrevealing.  Thyroid labs normal as well as LFTs, CBC, creatinine.  Possibly related to medications.  Bowel function is good.  1 BM daily for the most part.  No blood in stool or melena.  Current Outpatient Medications  Medication Sig Dispense Refill  . busPIRone (BUSPAR) 5 MG tablet Take 1 tablet (5 mg total) by mouth 3 (three) times daily. 90 tablet 3  . dicyclomine (BENTYL) 20 MG tablet Take 1 tablet (20 mg total) by mouth every 6 (six) hours as needed for spasms (abdominal cramping). 30 tablet 1  . LORazepam (ATIVAN) 0.5 MG tablet Take 1 tablet (0.5 mg total) by mouth 2 (two) times daily as needed. 60 tablet 1  . methocarbamol (ROBAXIN) 500 MG tablet Take 1 tablet (500 mg total) by mouth every 6 (six) hours as needed for muscle spasms. 20 tablet 0  . Multiple Vitamin (MULTIVITAMIN WITH MINERALS) TABS tablet Take 1 tablet by mouth daily.    . pantoprazole (PROTONIX) 40 MG tablet Take 1 tablet (40 mg total) by mouth 2 (two) times daily  before a meal. 60 tablet 5   No current facility-administered medications for this visit.     Allergies as of 01/04/2018 - Review Complete 01/04/2018  Allergen Reaction Noted  . Demerol [meperidine] Nausea And Vomiting 11/21/2011  . Ibuprofen  08/17/2017    ROS:  General: Negative for anorexia, weight loss, fever, chills, fatigue, weakness. ENT: Negative for hoarseness, difficulty swallowing , nasal congestion. CV: Negative for chest pain, angina, palpitations, dyspnea on exertion, peripheral edema.  Respiratory: Negative for dyspnea at rest, dyspnea on exertion, cough, sputum, wheezing.  GI: See history of present illness. GU:  Negative for dysuria, hematuria, urinary incontinence, urinary frequency, nocturnal urination.  Endo: Negative for unusual weight change.    Physical Examination:   BP 116/71   Pulse (!) 57   Temp (!) 97.2 F (36.2 C) (Oral)   Ht 5' 10"  (1.778 m)   Wt 162 lb 12.8 oz (73.8 kg)   BMI 23.36 kg/m   General: Well-nourished, well-developed in no acute distress.  Eyes: No icterus. Mouth: Oropharyngeal mucosa moist and pink , no lesions erythema or exudate. Lungs: Clear to auscultation bilaterally.  Heart: Regular rate and rhythm, no murmurs rubs or gallops.  Abdomen: Bowel sounds are normal, nontender, nondistended, no hepatosplenomegaly or masses, no abdominal bruits or hernia , no rebound or guarding.   Extremities: No lower extremity edema. No clubbing or deformities. Neuro: Alert and oriented x 4   Skin: Warm and  dry, no jaundice.   Psych: Alert and cooperative, normal mood and affect.  Labs:  Lab Results  Component Value Date   CREATININE 0.83 10/08/2017   BUN 12 10/08/2017   NA 142 10/08/2017   K 4.5 10/08/2017   CL 102 10/08/2017   CO2 24 10/08/2017   Lab Results  Component Value Date   ALT 13 10/08/2017   AST 14 10/08/2017   ALKPHOS 67 10/08/2017   BILITOT 0.5 10/08/2017   Lab Results  Component Value Date   WBC 7.2 10/08/2017    HGB 14.0 10/08/2017   HCT 40.4 10/08/2017   MCV 91 10/08/2017   PLT 238 10/08/2017    Imaging Studies: Dg Elbow Complete Right  Result Date: 12/22/2017 CLINICAL DATA:  Pain after trauma EXAM: RIGHT ELBOW - COMPLETE 3+ VIEW COMPARISON:  None. FINDINGS: There is no evidence of fracture, dislocation, or joint effusion. There is no evidence of arthropathy or other focal bone abnormality. Soft tissues are unremarkable. IMPRESSION: Negative. Electronically Signed   By: Dorise Bullion III M.D   On: 12/22/2017 20:24

## 2018-01-04 NOTE — Assessment & Plan Note (Signed)
47 year old female with history of dyspepsia, GERD and at time of EGD was found to have a normal-appearing esophagus, gastritis, duodenitis.  No H. pylori.  She has been on pantoprazole 40 mg twice daily for 5 to 6 months.  Continues to avoid trigger foods.  Avoids NSAIDs and aspirin although admits that she used to take quite a bit of aspirin powders her back and years ago took quite a bit ibuprofen.  Clinically she is doing well.  She is going to try to back down to once daily pantoprazole but if she has breakthrough symptoms more than a few times per week she will go back to twice daily dosing for now.  Continue to avoid trigger foods.  Avoid NSAIDs and aspirins as much as possible.  Return to the office in 6 months or call sooner if needed.

## 2018-02-04 ENCOUNTER — Other Ambulatory Visit: Payer: Self-pay | Admitting: Adult Health

## 2018-02-04 DIAGNOSIS — Z23 Encounter for immunization: Secondary | ICD-10-CM | POA: Diagnosis not present

## 2018-02-15 DIAGNOSIS — Z1389 Encounter for screening for other disorder: Secondary | ICD-10-CM | POA: Diagnosis not present

## 2018-02-15 DIAGNOSIS — Z6824 Body mass index (BMI) 24.0-24.9, adult: Secondary | ICD-10-CM | POA: Diagnosis not present

## 2018-02-15 DIAGNOSIS — G4700 Insomnia, unspecified: Secondary | ICD-10-CM | POA: Diagnosis not present

## 2018-04-19 DIAGNOSIS — B951 Streptococcus, group B, as the cause of diseases classified elsewhere: Secondary | ICD-10-CM | POA: Diagnosis not present

## 2018-04-19 DIAGNOSIS — N390 Urinary tract infection, site not specified: Secondary | ICD-10-CM | POA: Diagnosis not present

## 2018-04-19 DIAGNOSIS — R310 Gross hematuria: Secondary | ICD-10-CM | POA: Diagnosis not present

## 2018-04-26 DIAGNOSIS — R310 Gross hematuria: Secondary | ICD-10-CM | POA: Diagnosis not present

## 2018-04-26 DIAGNOSIS — R319 Hematuria, unspecified: Secondary | ICD-10-CM | POA: Diagnosis not present

## 2018-05-03 ENCOUNTER — Other Ambulatory Visit: Payer: Self-pay | Admitting: Adult Health

## 2018-05-03 DIAGNOSIS — N3020 Other chronic cystitis without hematuria: Secondary | ICD-10-CM | POA: Diagnosis not present

## 2018-05-03 DIAGNOSIS — R310 Gross hematuria: Secondary | ICD-10-CM | POA: Diagnosis not present

## 2018-05-21 DIAGNOSIS — Z6824 Body mass index (BMI) 24.0-24.9, adult: Secondary | ICD-10-CM | POA: Diagnosis not present

## 2018-05-21 DIAGNOSIS — F5101 Primary insomnia: Secondary | ICD-10-CM | POA: Diagnosis not present

## 2018-05-21 DIAGNOSIS — Z1389 Encounter for screening for other disorder: Secondary | ICD-10-CM | POA: Diagnosis not present

## 2018-06-12 ENCOUNTER — Other Ambulatory Visit: Payer: Self-pay | Admitting: Adult Health

## 2018-06-14 DIAGNOSIS — R8271 Bacteriuria: Secondary | ICD-10-CM | POA: Diagnosis not present

## 2018-06-14 DIAGNOSIS — N3946 Mixed incontinence: Secondary | ICD-10-CM | POA: Diagnosis not present

## 2018-06-14 DIAGNOSIS — N3020 Other chronic cystitis without hematuria: Secondary | ICD-10-CM | POA: Diagnosis not present

## 2018-07-09 ENCOUNTER — Encounter: Payer: Self-pay | Admitting: Gastroenterology

## 2018-07-09 ENCOUNTER — Ambulatory Visit: Payer: BLUE CROSS/BLUE SHIELD | Admitting: Gastroenterology

## 2018-07-09 VITALS — BP 101/57 | HR 65 | Temp 97.5°F | Ht 70.0 in | Wt 165.6 lb

## 2018-07-09 DIAGNOSIS — K219 Gastro-esophageal reflux disease without esophagitis: Secondary | ICD-10-CM

## 2018-07-09 MED ORDER — PANTOPRAZOLE SODIUM 40 MG PO TBEC: DELAYED_RELEASE_TABLET | ORAL | 3 refills | Status: DC

## 2018-07-09 NOTE — Progress Notes (Signed)
      Primary Care Physician: [facility]  Primary Gastroenterologist:  Barney Drain, MD   Chief Complaint  Patient presents with  . Gastroesophageal Reflux    HPI: Julie [name] is a 48 y.o. female here for follow-up.  She was last seen in September.  She has a history of dyspepsia/chest pain.  EGD in April 2019 with normal esophagus, multiple gastric polyps (fundic gland polyps), gastritis/duodenitis with no H. pylori.  Small bowel biopsies were unremarkable.  Previous colonoscopy September 2018, internal hemorrhoids, recommended 10-year repeat exam with MAC in 2028.  Since her last office visit she drop back from twice daily to daily pantoprazole. She has noted foods she previously tolerated, no longer tolerates (developes breakthrough heartburn) and has had to take second pantoprazole along with TUMS/Rolaids. Foods such as cooked onions and green peppers. Still pretty strict with diet. No abdominal pain. No dysphagia.  Bowel movements are regular.  No blood in the stool or melena.  No unintentional weight loss.     Current Outpatient Medications  Medication Sig Dispense Refill  . busPIRone (BUSPAR) 5 MG tablet TAKE (1) TABLET BY MOUTH (3) TIMES DAILY. 90 tablet 3  . dicyclomine (BENTYL) 20 MG tablet Take 1 tablet (20 mg total) by mouth every 6 (six) hours as needed for spasms (abdominal cramping). 30 tablet 1  . LORazepam (ATIVAN) 0.5 MG tablet TAKE (1) TABLET BY MOUTH TWICE A DAY AS NEEDED. 60 tablet 0  . Multiple Vitamin (MULTIVITAMIN WITH MINERALS) TABS tablet Take 1 tablet by mouth daily.    . pantoprazole (PROTONIX) 40 MG tablet Take 1 tablet (40 mg total) by mouth 2 (two) times daily before a meal. (Patient taking differently: Take 40 mg by mouth daily. Occasionally takes BID if needed) 60 tablet 5  . traZODone (DESYREL) 100 MG tablet Take 1 tablet by mouth at bedtime.     No current facility-administered medications for this visit.      Allergies as of 07/09/2018 - Review Complete 07/09/2018  Allergen Reaction Noted  . Demerol [meperidine] Nausea And Vomiting 11/21/2011  . Ibuprofen  08/17/2017    ROS:  General: Negative for anorexia, weight loss, fever, chills, fatigue, weakness. ENT: Negative for hoarseness, difficulty swallowing , nasal congestion. CV: Negative for chest pain, angina, palpitations, dyspnea on exertion, peripheral edema.  Respiratory: Negative for dyspnea at rest, dyspnea on exertion, cough, sputum, wheezing.  GI: See history of present illness. GU:  Negative for dysuria, hematuria, urinary incontinence, urinary frequency, nocturnal urination.  Endo: Negative for unusual weight change.    Physical Examination:   BP (!) 101/57   Pulse 65   Temp (!) 97.5 F (36.4 C) (Oral)   Ht _0  (1.778 m)   Wt 165 lb 9.6 oz (75.1 kg)   BMI 23.76 kg/m   General: Well-nourished, well-developed in no acute distress.  Eyes: No icterus. Mouth: Oropharyngeal mucosa moist and pink , no lesions erythema or exudate. Lungs: Clear to auscultation bilaterally.  Heart: Regular rate and rhythm, no murmurs rubs or gallops.  Abdomen: Bowel sounds are normal, nontender, nondistended, no hepatosplenomegaly or masses, no abdominal bruits or hernia , no rebound or guarding.   Extremities: No lower extremity edema. No clubbing or deformities. Neuro: Alert and oriented x 4   Skin: Warm and dry, no jaundice.   Psych: Alert and cooperative, normal mood and affect.

## 2018-07-09 NOTE — Assessment & Plan Note (Signed)
Having some breakthrough heartburn symptoms on once daily pantoprazole.  Tries to avoid trigger foods as much as possible, however some foods that she used to be able to enjoy are causing her some breakthrough heartburn.  She gets relief with over-the-counter antacids and a second pantoprazole.  No alarm symptoms.  She continues to avoid aspirin and NSAIDs.  Rarely has to take dicyclomine for abdominal spasms.  At this point would encourage her to take daily pantoprazole before breakfast, take evening dose every other day on schedule.  If this controls her symptoms would continue, otherwise she will let me know when we will consider going back to full twice daily dosing.  New prescription provided.  We will see her back in 1 year or she will call sooner if needed.

## 2018-07-09 NOTE — Progress Notes (Signed)
CC'D TO PCP °

## 2018-07-09 NOTE — Patient Instructions (Signed)
1. Take pantoprazole every morning and try taking pantoprazole every other evening. New RX sent. 2. Continue to avoid trigger foods as much as possible. 3. Return to office in one year or call sooner if needed.

## 2018-10-18 DIAGNOSIS — R8271 Bacteriuria: Secondary | ICD-10-CM | POA: Diagnosis not present

## 2018-10-18 DIAGNOSIS — N3020 Other chronic cystitis without hematuria: Secondary | ICD-10-CM | POA: Diagnosis not present

## 2018-10-18 DIAGNOSIS — N3946 Mixed incontinence: Secondary | ICD-10-CM | POA: Diagnosis not present

## 2018-10-25 IMAGING — DX DG ELBOW COMPLETE 3+V*R*
4 series · 4 of 4 positions shown · non-contrast
Comparison: None.

CLINICAL DATA: Pain after trauma

EXAM:
RIGHT ELBOW - COMPLETE 3+ VIEW

[elbow ap]
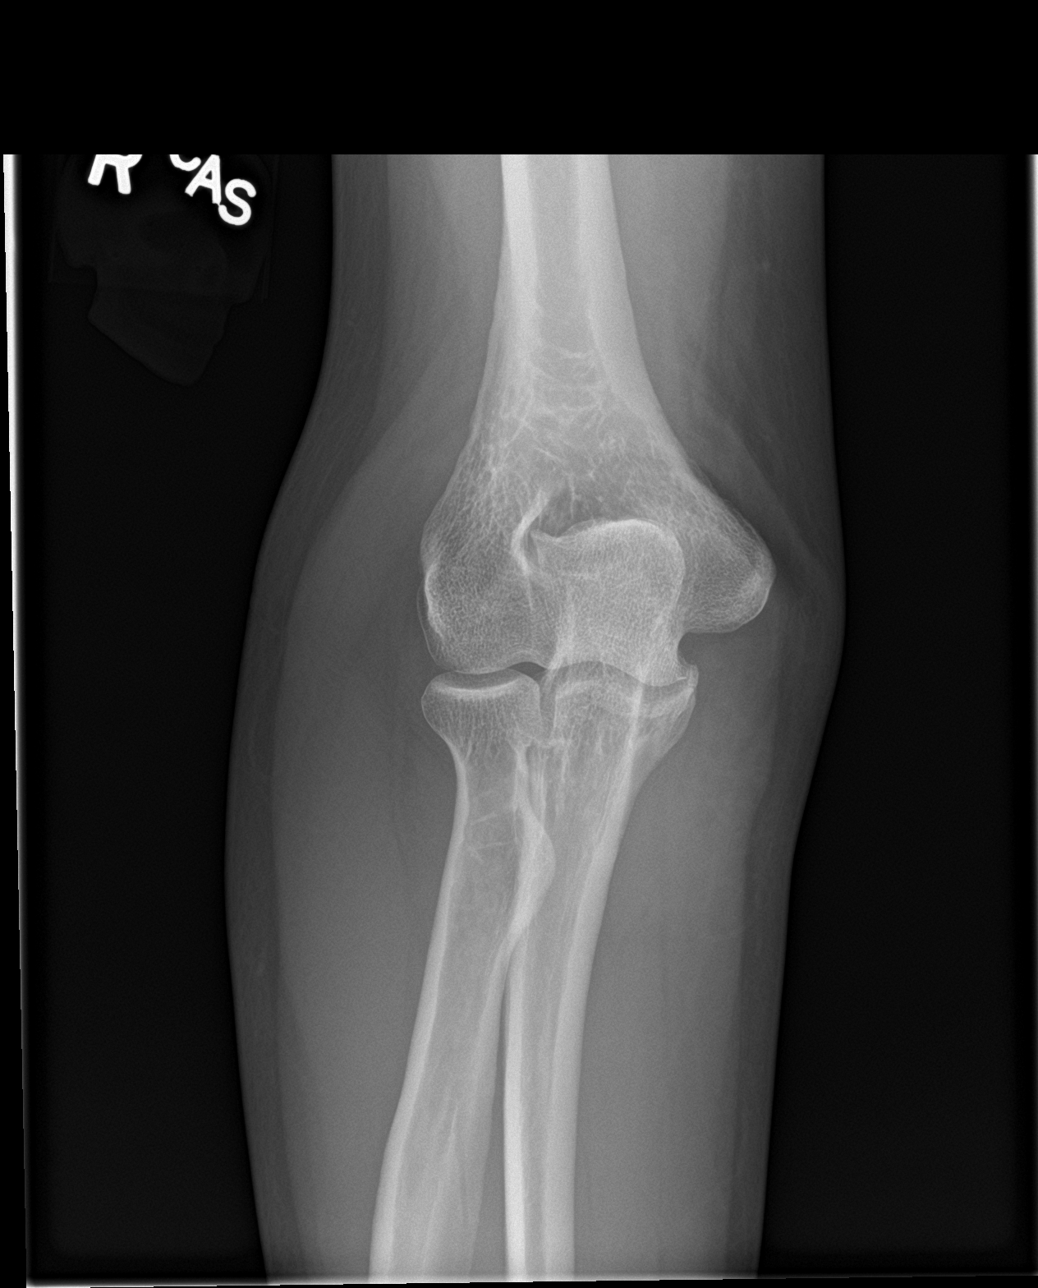

[elbow obl (1 of 2)]
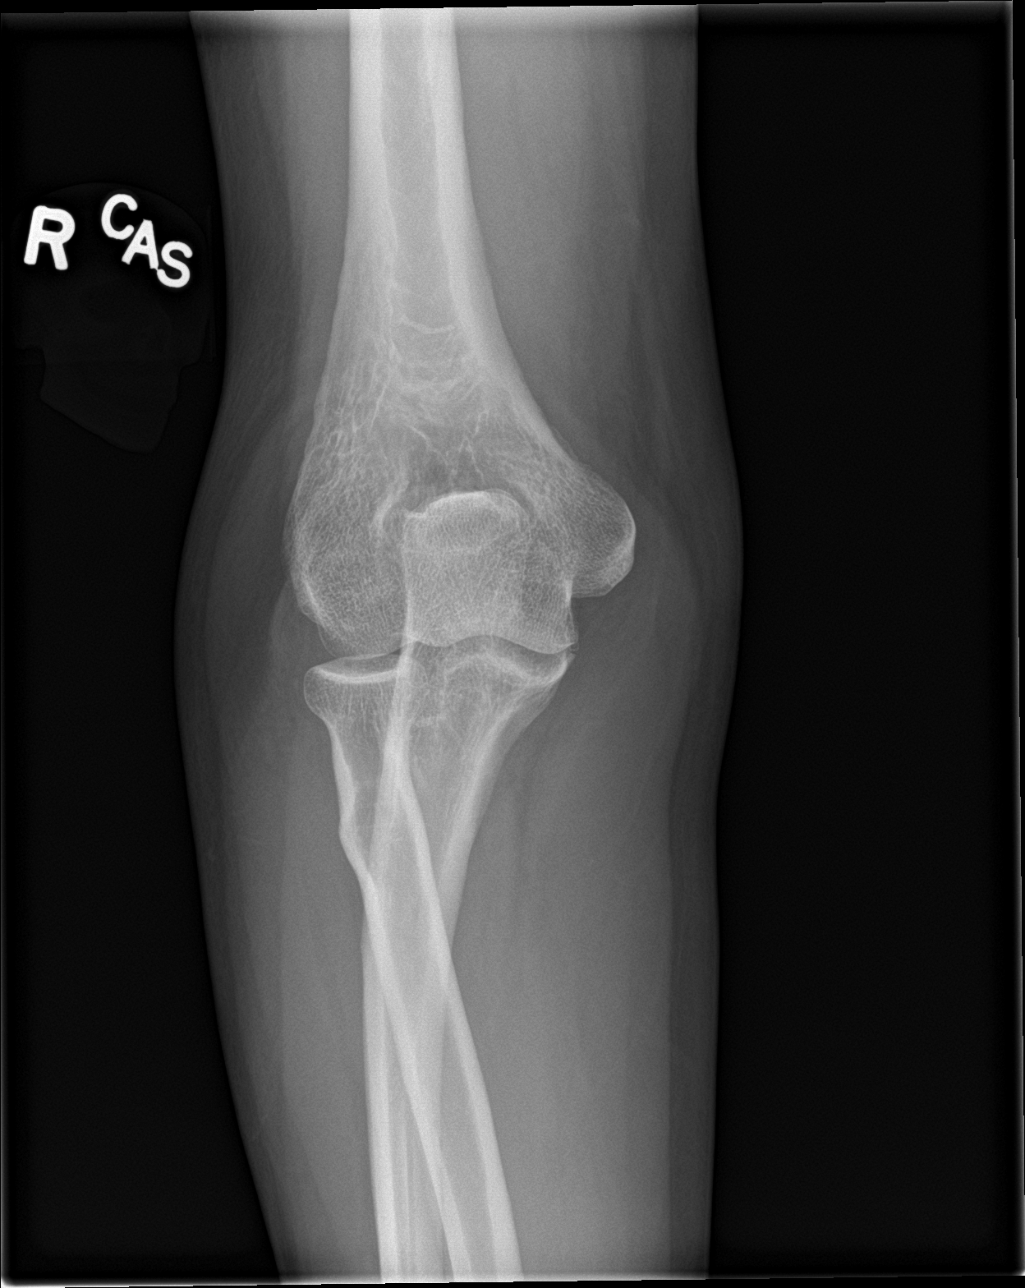

[elbow lat]
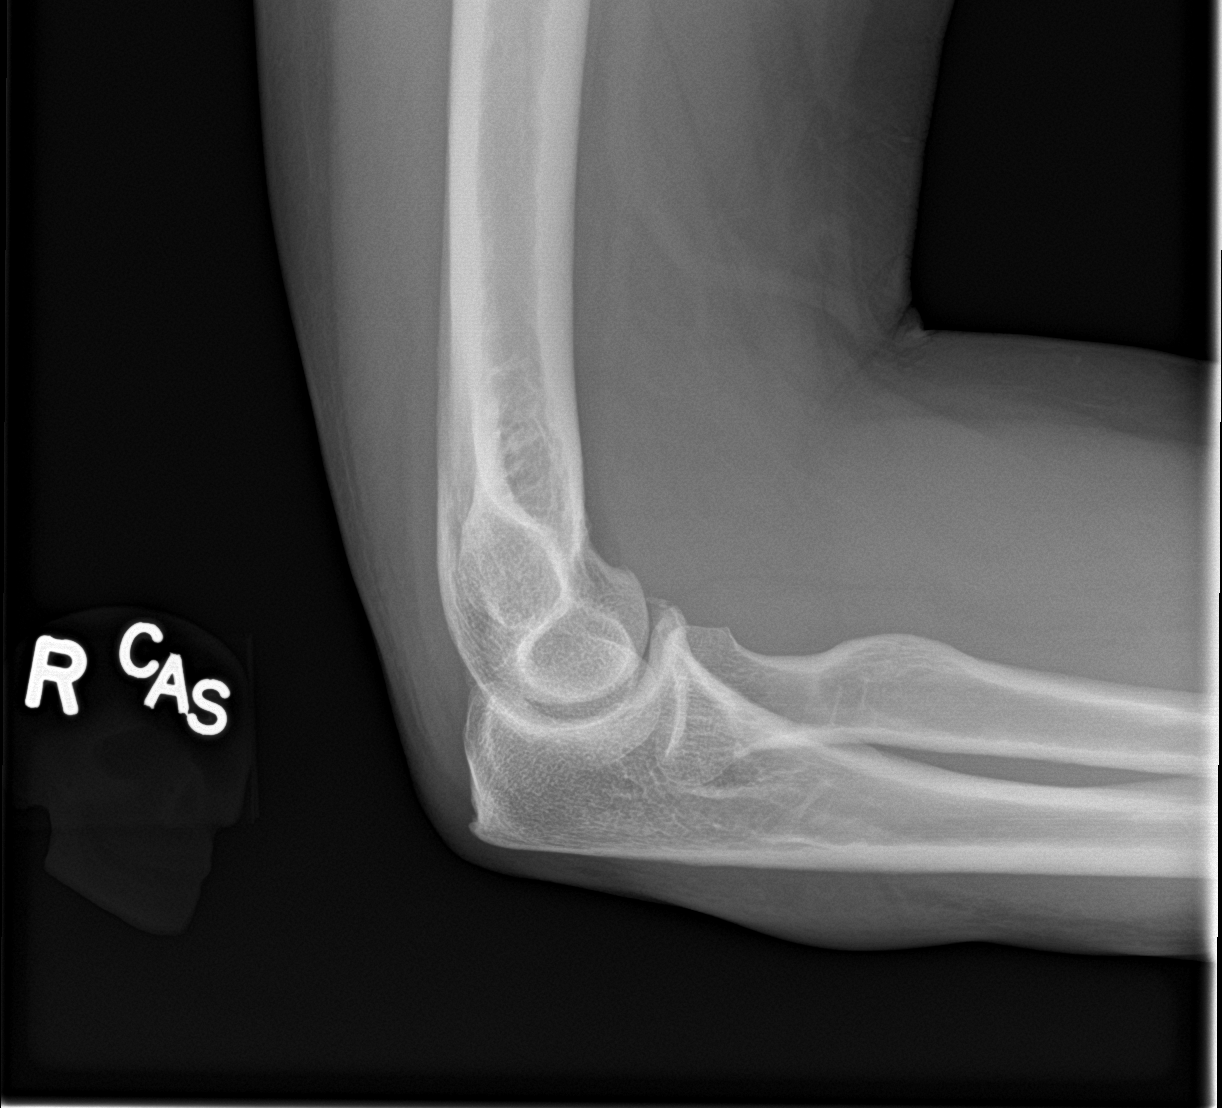

[elbow obl (2 of 2)]
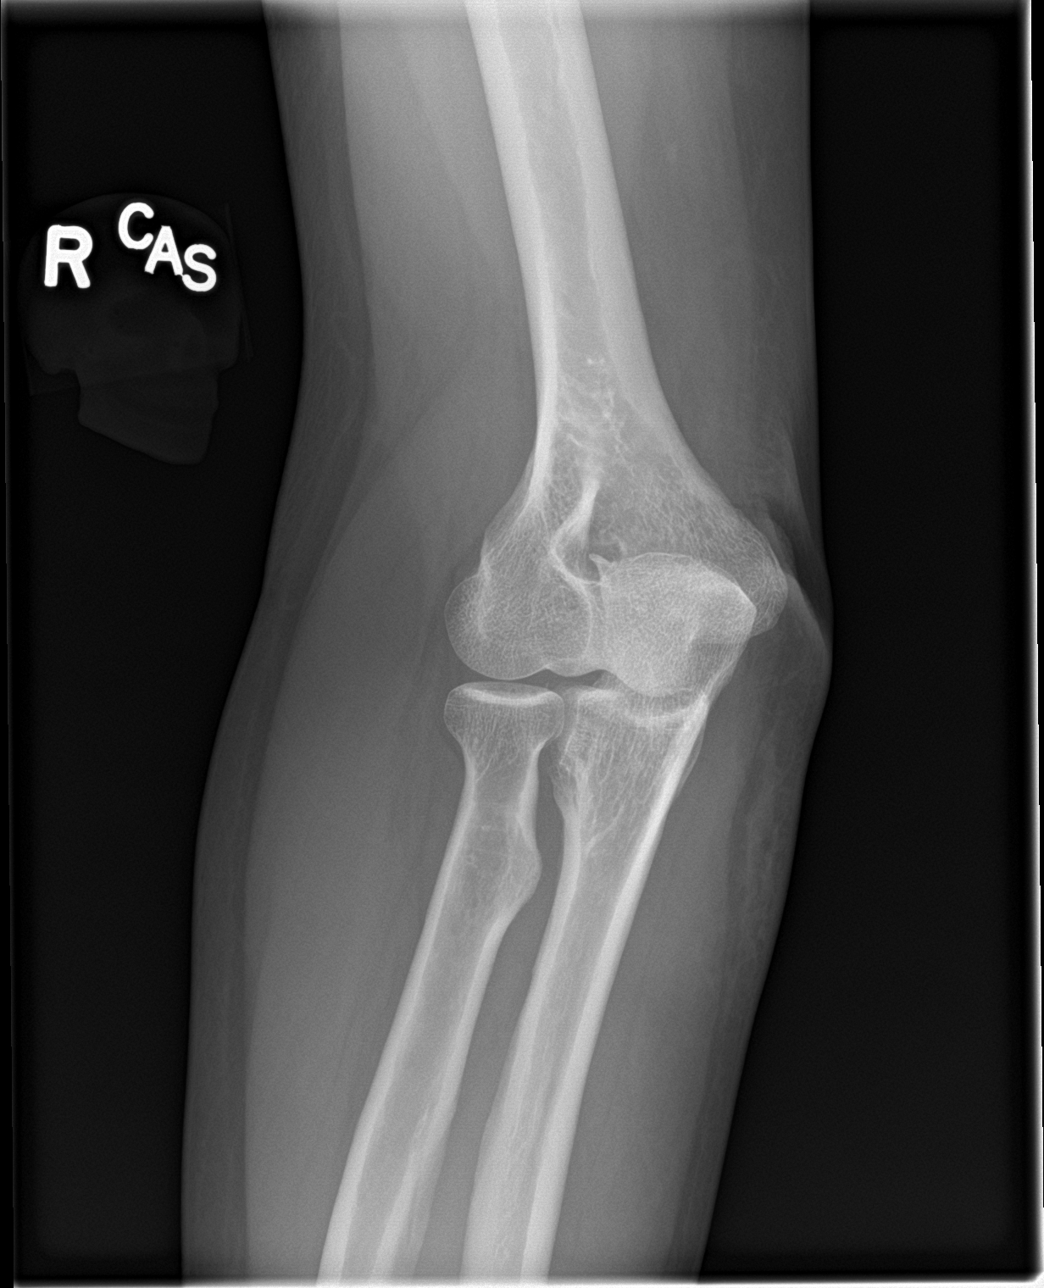

[4 of 4 positions shown; findings below may reference images not displayed]

FINDINGS: There is no evidence of fracture, dislocation, or joint effusion.
There is no evidence of arthropathy or other focal bone abnormality.
Soft tissues are unremarkable.
IMPRESSION: Negative.

## 2018-11-06 ENCOUNTER — Other Ambulatory Visit (HOSPITAL_COMMUNITY): Payer: Self-pay | Admitting: Adult Health

## 2018-11-06 DIAGNOSIS — Z1231 Encounter for screening mammogram for malignant neoplasm of breast: Secondary | ICD-10-CM

## 2018-11-11 ENCOUNTER — Other Ambulatory Visit: Payer: Self-pay | Admitting: Adult Health

## 2018-11-13 ENCOUNTER — Other Ambulatory Visit: Payer: Self-pay | Admitting: *Deleted

## 2018-11-18 ENCOUNTER — Other Ambulatory Visit: Payer: Self-pay

## 2018-11-18 ENCOUNTER — Ambulatory Visit (HOSPITAL_COMMUNITY)
Admission: RE | Admit: 2018-11-18 | Discharge: 2018-11-18 | Disposition: A | Discharge status: Home / Self Care | Payer: BC Managed Care – PPO | Source: Ambulatory Visit | Attending: Adult Health | Admitting: Adult Health

## 2018-11-18 DIAGNOSIS — Z1231 Encounter for screening mammogram for malignant neoplasm of breast: Secondary | ICD-10-CM

## 2018-11-22 ENCOUNTER — Other Ambulatory Visit: Payer: BLUE CROSS/BLUE SHIELD | Admitting: Adult Health

## 2019-01-08 ENCOUNTER — Emergency Department (HOSPITAL_COMMUNITY): Payer: BC Managed Care – PPO

## 2019-01-08 ENCOUNTER — Encounter (HOSPITAL_COMMUNITY): Payer: Self-pay

## 2019-01-08 ENCOUNTER — Emergency Department (HOSPITAL_COMMUNITY)
Admission: EM | Admit: 2019-01-08 | Discharge: 2019-01-08 | Disposition: A | Discharge status: Home / Self Care | Payer: BC Managed Care – PPO | Attending: Emergency Medicine | Admitting: Emergency Medicine

## 2019-01-08 ENCOUNTER — Other Ambulatory Visit: Payer: Self-pay

## 2019-01-08 ENCOUNTER — Telehealth: Payer: Self-pay | Admitting: Gastroenterology

## 2019-01-08 DIAGNOSIS — R072 Precordial pain: Secondary | ICD-10-CM | POA: Insufficient documentation

## 2019-01-08 DIAGNOSIS — R110 Nausea: Secondary | ICD-10-CM | POA: Insufficient documentation

## 2019-01-08 DIAGNOSIS — R079 Chest pain, unspecified: Secondary | ICD-10-CM | POA: Diagnosis not present

## 2019-01-08 LAB — LIPASE, BLOOD: Lipase: 35 U/L (ref 11–51)

## 2019-01-08 LAB — CBC WITH DIFFERENTIAL/PLATELET
Abs Immature Granulocytes: 0.01 10*3/uL (ref 0.00–0.07)
Basophils Absolute: 0 10*3/uL (ref 0.0–0.1)
Basophils Relative: 1 %
Eosinophils Absolute: 0.1 10*3/uL (ref 0.0–0.5)
Eosinophils Relative: 2 %
HCT: 42.1 % (ref 36.0–46.0)
Hemoglobin: 14.1 g/dL (ref 12.0–15.0)
Immature Granulocytes: 0 %
Lymphocytes Relative: 35 %
Lymphs Abs: 1.9 10*3/uL (ref 0.7–4.0)
MCH: 31.9 pg (ref 26.0–34.0)
MCHC: 33.5 g/dL (ref 30.0–36.0)
MCV: 95.2 fL (ref 80.0–100.0)
Monocytes Absolute: 0.4 10*3/uL (ref 0.1–1.0)
Monocytes Relative: 8 %
Neutro Abs: 3 10*3/uL (ref 1.7–7.7)
Neutrophils Relative %: 54 %
Platelets: 251 10*3/uL (ref 150–400)
RBC: 4.42 MIL/uL (ref 3.87–5.11)
RDW: 12.4 % (ref 11.5–15.5)
WBC: 5.5 10*3/uL (ref 4.0–10.5)
nRBC: 0 % (ref 0.0–0.2)

## 2019-01-08 LAB — COMPREHENSIVE METABOLIC PANEL WITH GFR
ALT: 24 U/L (ref 0–44)
AST: 20 U/L (ref 15–41)
Albumin: 4.2 g/dL (ref 3.5–5.0)
Alkaline Phosphatase: 59 U/L (ref 38–126)
Anion gap: 7 (ref 5–15)
BUN: 13 mg/dL (ref 6–20)
CO2: 27 mmol/L (ref 22–32)
Calcium: 9.4 mg/dL (ref 8.9–10.3)
Chloride: 104 mmol/L (ref 98–111)
Creatinine, Ser: 0.75 mg/dL (ref 0.44–1.00)
GFR calc Af Amer: >60 mL/min
GFR calc non Af Amer: >60 mL/min
Glucose, Bld: 101 mg/dL — ABNORMAL HIGH (ref 70–99)
Potassium: 3.9 mmol/L (ref 3.5–5.1)
Sodium: 138 mmol/L (ref 135–145)
Total Bilirubin: 0.6 mg/dL (ref 0.3–1.2)
Total Protein: 6.7 g/dL (ref 6.5–8.1)

## 2019-01-08 LAB — TROPONIN I (HIGH SENSITIVITY)
Troponin I (High Sensitivity): <2 ng/L (ref <18)
Troponin I (High Sensitivity): <2 ng/L (ref <18)

## 2019-01-08 MED ORDER — ONDANSETRON HCL 4 MG/2ML IJ SOLN
4.0000 mg | Freq: Once | INTRAMUSCULAR | Status: AC
  Administered 2019-01-08: 4 mg via INTRAVENOUS
  Filled 2019-01-08: qty 2

## 2019-01-08 MED ORDER — ALUM & MAG HYDROXIDE-SIMETH 200-200-20 MG/5ML PO SUSP
15.0000 mL | Freq: Once | ORAL | Status: AC
  Administered 2019-01-08: 15 mL via ORAL
  Filled 2019-01-08: qty 30

## 2019-01-08 NOTE — Discharge Instructions (Signed)
You were seen in the emergency department today with chest and upper abdomen discomfort.  Your lab work testing for heart attack is normal.  I suspect that this is a more severe esophagitis/gastritis type illness.  You can add Maalox as needed for pain which is over-the-counter.  Please call your gastroenterologist today to schedule a follow-up appointment.  Return to the emergency department if your symptoms become suddenly worse or change in a way that is concerning to you.

## 2019-01-08 NOTE — Telephone Encounter (Signed)
Recommend OV as patient not seen in six months.

## 2019-01-08 NOTE — ED Triage Notes (Signed)
Pt reports chest pain in center of chest and nausea this morning.  Reports started around 0545.  Denies any cough, fever, or sob.

## 2019-01-08 NOTE — ED Notes (Signed)
Patient transported to X-ray 

## 2019-01-08 NOTE — Telephone Encounter (Signed)
Pt last saw Neil Crouch, PA on 07/09/2018.  Forwarding to her for advice.

## 2019-01-08 NOTE — Telephone Encounter (Signed)
[phone] PATIENT CALLED AND SAID THAT SHE HAD TO GO TO THE ER BECAUSE OF CHEST PAIN AND WAS TOLD IT WAS HER REFLUX,  DOES SHE NEED AN APPOINTMENT OR CAN ANOTHER MEDICATION BE CALLED IN THAT SHE CAN TRY?

## 2019-01-08 NOTE — ED Provider Notes (Signed)
Emergency Department Provider Note   I have reviewed the triage vital signs and the nursing notes.   HISTORY  Chief Complaint Chest Pain   HPI Julie [name] is a 48 y.o. female with PMH of GERD presents to the ED with central chest tightness/discomfort which began this morning.  She had associated nausea.  She denies shortness of breath.  Patient took her Tums take medication and has been compliant with her twice daily Protonix prescription.  She took these this morning with no relief.  She tells me that the tightness in the chest is somewhat atypical for her GERD symptoms and got her concerned so she presented to the emergency department.  She denies any fevers or chills.  No modifying factors.  Pain is in the mid epigastric region and radiating to the center of the chest.   Past Medical History:  Diagnosis Date  . Anxiety   . Arthritis    Right hand ring finger  . Bad odor of urine 09/09/2015  . Complication of anesthesia   . GERD (gastroesophageal reflux disease)   . Headache   . PONV (postoperative nausea and vomiting)     Patient Active Problem List   Diagnosis Date Noted  . GERD (gastroesophageal reflux disease) 01/04/2018  . Skin tag 10/04/2017  . Screening for colorectal cancer 10/04/2017  . Well woman exam with routine gynecological exam 10/04/2017  . Tired 10/04/2017  . Screening for thyroid disorder 10/04/2017  . Screening cholesterol level 10/04/2017  . Gastritis and gastroduodenitis 10/01/2017  . Constipation 10/01/2017  . Dyspepsia 06/26/2017  . Chest pain 06/26/2017  . Screening for genitourinary condition 09/13/2016  . Heme positive stool 09/13/2016  . Bad odor of urine 09/09/2015  . Anxiety 09/17/2013    Past Surgical History:  Procedure Laterality Date  . ABDOMINAL HYSTERECTOMY    . BIOPSY  08/14/2017   Procedure: BIOPSY;  Surgeon: Danie Binder, MD;  Location: [facility];  Service: Endoscopy;;  duodenum gastric  . BLADDER SURGERY     . CLOSED REDUCTION FINGER WITH PERCUTANEOUS PINNING Right    Ring finger  . COLONOSCOPY WITH PROPOFOL N/A 01/16/2017   Dr. Oneida Alar: Redundant colon, otherwise normal.  Next anoscopy in 10 years with MAC.  Marland Kitchen ESOPHAGOGASTRODUODENOSCOPY (EGD) WITH PROPOFOL N/A 08/14/2017   Fields: Gastritis, no H. pylori.  Fundic gland polyps.  Small bowel biopsies negative.    Allergies Demerol [meperidine] and Ibuprofen  Family History  Adopted: Yes  Problem Relation Age of Onset  . Irritable bowel syndrome Son   . Colon cancer Neg Hx   . Gastric cancer Neg Hx   . Esophageal cancer Neg Hx     Social History Social History   Tobacco Use  . Smoking status: Never Smoker  . Smokeless tobacco: Never Used  Substance Use Topics  . Alcohol use: Yes    Comment: occassionally  . Drug use: No    Review of Systems  Constitutional: No fever/chills Eyes: No visual changes. ENT: No sore throat. Cardiovascular: Positive chest pain. Respiratory: Denies shortness of breath. Gastrointestinal: Positive mid-epigastric abdominal pain. Positive nausea, no vomiting.  No diarrhea.  No constipation. Genitourinary: Negative for dysuria. Musculoskeletal: Negative for back pain. Skin: Negative for rash. Neurological: Negative for headaches, focal weakness or numbness.  10-point ROS otherwise negative.  ____________________________________________   PHYSICAL EXAM:  VITAL SIGNS: ED Triage Vitals  Enc Vitals Group     BP 01/08/19 1027 127/63     Pulse Rate 01/08/19 1027  63     Resp 01/08/19 1027 18     Temp 01/08/19 1027 98.3 F (36.8 C)     Temp Source 01/08/19 1027 Oral     SpO2 01/08/19 1027 100 %     Weight 01/08/19 1025 160 lb (72.6 kg)     Height 01/08/19 1025 _0  (1.778 m)   Constitutional: Alert and oriented. Well appearing and in no acute distress. Eyes: Conjunctivae are normal.  Head: Atraumatic. Nose: No congestion/rhinnorhea. Mouth/Throat: Mucous membranes are moist.   Neck: No  stridor.   Cardiovascular: Normal rate, regular rhythm. Good peripheral circulation. Grossly normal heart sounds.   Respiratory: Normal respiratory effort.  No retractions. Lungs CTAB. Gastrointestinal: Soft with mild mid-epigastric tenderness. No RUQ pain. No rebound or guarding. Remainder of the abdomen is diffusely non-tender. No distention.  Musculoskeletal: No lower extremity tenderness nor edema. No gross deformities of extremities. Neurologic:  Normal speech and language. Skin:  Skin is warm, dry and intact. No rash noted.  ____________________________________________   LABS (all labs ordered are listed, but only abnormal results are displayed)  Labs Reviewed  COMPREHENSIVE METABOLIC PANEL - Abnormal; Notable for the following components:      Result Value   Glucose, Bld 101 (*)    All other components within normal limits  LIPASE, BLOOD  CBC WITH DIFFERENTIAL/PLATELET  TROPONIN I (HIGH SENSITIVITY)  TROPONIN I (HIGH SENSITIVITY)   ____________________________________________  EKG   EKG Interpretation  Date/Time:  Wednesday January 08 2019 10:30:37 EDT Ventricular Rate:  64 PR Interval:    QRS Duration: 108 QT Interval:  405 QTC Calculation: 418 R Axis:   15 Text Interpretation:  Sinus rhythm Low voltage, precordial leads No STEMI  Confirmed by Nanda Quinton [phone]) on 01/08/2019 10:34:12 AM       ____________________________________________  RADIOLOGY  Dg Chest 2 View  Result Date: 01/08/2019 CLINICAL DATA:  Chest pain EXAM: CHEST - 2 VIEW COMPARISON:  05/18/2017 FINDINGS: The heart size and mediastinal contours are within normal limits. Both lungs are clear. The visualized skeletal structures are unremarkable. IMPRESSION: No acute abnormality of the lungs. Electronically Signed   By: Eddie Candle M.D.   On: 01/08/2019 11:59    ____________________________________________   PROCEDURES  Procedure(s) performed:   Procedures  None   ____________________________________________   INITIAL IMPRESSION / ASSESSMENT AND PLAN / ED COURSE  Pertinent labs & imaging results that were available during my care of the patient were reviewed by me and considered in my medical decision making (see chart for details).   Patient presents to the emergency department for evaluation of atypical chest pain starting this morning.  She has a known history of GERD.  Suspect this clinically but given the nature of her discomfort this morning I do plan for cardiac type work-up.  Troponin ordered.  No abnormal vitals, shortness of breath, other findings to suspect PE. Will give Maalox and Zofran for symptoms mgmt here while awaiting test results.   01:51 PM  Chest x-ray and labs including delta troponin are normal.  Patient somewhat improved with Maalox.  Plan to add Maalox at home and have close f/u with PCP.  ____________________________________________  FINAL CLINICAL IMPRESSION(S) / ED DIAGNOSES  Final diagnoses:  Precordial chest pain     MEDICATIONS GIVEN DURING THIS VISIT:  Medications  alum & mag hydroxide-simeth (MAALOX/MYLANTA) 200-200-20 MG/5ML suspension 15 mL (15 mLs Oral Given 01/08/19 1056)  ondansetron (ZOFRAN) injection 4 mg (4 mg Intravenous Given 01/08/19 1102)  Note:  This document was prepared using Dragon voice recognition software and may include unintentional dictation errors.  Nanda Quinton, MD Emergency Medicine    Kennon Encinas, [facility], MD 01/08/19 [phone]

## 2019-01-09 NOTE — Telephone Encounter (Signed)
PT is scheduled 01/14/2019 at 8:30 am with Walden Field, NP.

## 2019-01-14 ENCOUNTER — Encounter: Payer: Self-pay | Admitting: Nurse Practitioner

## 2019-01-14 ENCOUNTER — Ambulatory Visit: Payer: BC Managed Care – PPO | Admitting: Nurse Practitioner

## 2019-01-14 ENCOUNTER — Other Ambulatory Visit: Payer: Self-pay

## 2019-01-14 VITALS — BP 117/67 | HR 62 | Temp 96.9°F | Ht 70.0 in | Wt 165.6 lb

## 2019-01-14 DIAGNOSIS — K219 Gastro-esophageal reflux disease without esophagitis: Secondary | ICD-10-CM | POA: Diagnosis not present

## 2019-01-14 DIAGNOSIS — R143 Flatulence: Secondary | ICD-10-CM | POA: Diagnosis not present

## 2019-01-14 MED ORDER — OMEPRAZOLE 20 MG PO CPDR: 20.0000 mg | DELAYED_RELEASE_CAPSULE | Freq: Two times a day (BID) | ORAL | 1 refills | Status: DC

## 2019-01-14 NOTE — Patient Instructions (Signed)
Your health issues we discussed today were:   GERD (reflux/heartburn): 1. Stop taking Protonix. 2. I have sent in a prescription for omeprazole (Prilosec) 20 mg twice a day 3. Call us in 2 to 3 weeks and let us know if this is helping any better 4. You can continue to use Tums or Rolaids as needed 5. Avoid eating 3 to 4 hours prior to bed 6. Call us if you have any worsening or severe symptoms  Gas: 1. Continue using Gas-X. 2. Alternatively, you can try Beano as well 3. Call us if you have any worsening or severe symptoms  Overall I recommend:  1. Continue your other current medications 2. Call us if you have any questions or concerns 3. Otherwise follow-up in 2 months.   Because of recent events of COVID-19 ("Coronavirus"), follow CDC recommendations:  1. Wash your hand frequently 2. Avoid touching your face 3. Stay away from people who are sick 4. If you have symptoms such as fever, cough, shortness of breath then call your healthcare provider for further guidance 5. If you are sick, STAY AT HOME unless otherwise directed by your healthcare provider. 6. Follow directions from state and national officials regarding staying safe   At [facility] we value your feedback. You may receive a survey about your visit today. Please share your experience as we strive to create trusting relationships with our patients to provide genuine, compassionate, quality care.  We appreciate your understanding and patience as we review any laboratory studies, imaging, and other diagnostic tests that are ordered as we care for you. Our office policy is 5 business days for review of these results, and any emergent or urgent results are addressed in a timely manner for your best interest. If you do not hear from our office in 1 week, please contact us.   We also encourage the use of MyChart, which contains your medical information for your review as well. If you are not enrolled in this  feature, an access code is on this after visit summary for your convenience. Thank you for allowing [place] to be involved in your care.  It was great to see you today!  I hope you have a great Fall!!

## 2019-01-14 NOTE — Assessment & Plan Note (Signed)
The patient describes significant gas symptoms.  She is started taking Gas-X within the past week and feels it is probably helping.  Recommend she continue this and follow-up in 2 months.

## 2019-01-14 NOTE — Assessment & Plan Note (Signed)
Worsening GERD symptoms over the past month and especially over the past week.  She is back to taking her PPI twice daily.  She has been on Protonix for a number of years and is never tried any other medication.  She may be becoming tolerant of Protonix with decreased effectiveness.  At this point I will switch her to a different PPI to see if this makes a difference.  I discussed with her that it might take a couple switches to find something that works better.  She is agreeable to this.  I will start her on omeprazole 20 mg twice daily and Tums as needed.  I have also recommended she avoid eating at least 3 to 4 hours prior to bedtime.  Continue trigger avoidance.  Follow-up in 2 months.

## 2019-01-14 NOTE — Progress Notes (Signed)
Referring Provider: [facility] Primary Care Physician:  [facility] Primary GI:  Dr. Oneida Alar  Chief Complaint  Patient presents with  . Gastroesophageal Reflux  . Gas    stomach gurgles a lot    HPI:   Julie [name] is a 48 y.o. female who presents for follow-up on reflux.  Patient was last seen in our office 07/09/2018 for the same.  History of dyspepsia and chest pain.  EGD April 2019 found normal esophagus, multiple gastric polyps (fundic gland polyps), gastritis/duodenitis without H. pylori.  Small bowel biopsies unremarkable.  Colonoscopy up-to-date 2018 with internal hemorrhoids and recommended 10-year repeat with MAC in 2028.  At her last visit she noted she decreased her twice daily pantoprazole although she will add a second dose along with Tums/Rolaids for breakthrough due to foods that she does not tolerate.  These include cooked onions and green peppers.  Is still pretty strict with her diet.  Bowel movements regular.  No other GI complaints.  Recommended pantoprazole every morning and every other evening.  Avoid trigger foods.  Follow-up in 1 year or sooner if needed.  The patient was in the emergency department 01/08/2023 precordial chest pain that she felt was atypical for her GERD.  Cardiac work-up was undertaken and essentially normal.  She was given Maalox and Zofran which helped somewhat.  Discharged to home with Maalox and close follow-up with primary care.  Patient called our office indicating she gone to the ER for chest pain and told her it was her reflux and requested a follow-up appointment.  Today she states she's doing ok overall. She has gone back to twice a day PPI. In the last month her symptoms of GERD have gotten worse. Avoids trigger foods and drinks (onions, significant caffeine/coffee). Tries to eat small meals but can be difficult. Typically her last meal of the day is 8-9 pm, goes to bed around 11pm but tries to sit  upright. Lately she has woken up with GERD. Also having more "hunger gurgling" in the past week, worsening gas. Has tried Gas-X just recently (in the last week) which she feels is helping her gas. Milk helps settle her GERD symptoms. Denies significant abdominal pain, N/V, hematochezia, melena, fever, chills, unintentional weight loss. Denies URI or flu-like symptoms. Denies loss of sense of taste or smell. Denies chest pain, dyspnea, dizziness, lightheadedness, syncope, near syncope. Denies any other upper or lower GI symptoms.  Past Medical History:  Diagnosis Date  . Anxiety   . Arthritis    Right hand ring finger  . Bad odor of urine 09/09/2015  . Complication of anesthesia   . GERD (gastroesophageal reflux disease)   . Headache   . PONV (postoperative nausea and vomiting)     Past Surgical History:  Procedure Laterality Date  . ABDOMINAL HYSTERECTOMY    . BIOPSY  08/14/2017   Procedure: BIOPSY;  Surgeon: Danie Binder, MD;  Location: [facility];  Service: Endoscopy;;  duodenum gastric  . BLADDER SURGERY    . CLOSED REDUCTION FINGER WITH PERCUTANEOUS PINNING Right    Ring finger  . COLONOSCOPY WITH PROPOFOL N/A 01/16/2017   Dr. Oneida Alar: Redundant colon, otherwise normal.  Next anoscopy in 10 years with MAC.  Marland Kitchen ESOPHAGOGASTRODUODENOSCOPY (EGD) WITH PROPOFOL N/A 08/14/2017   Fields: Gastritis, no H. pylori.  Fundic gland polyps.  Small bowel biopsies negative.    Current Outpatient Medications  Medication Sig Dispense Refill  . busPIRone (BUSPAR) 5  MG tablet TAKE (1) TABLET BY MOUTH (3) TIMES DAILY. 90 tablet 3  . EPINEPHrine 0.3 mg/0.3 mL IJ SOAJ injection Inject 0.3 mLs into the muscle once as needed.    Marland Kitchen LORazepam (ATIVAN) 0.5 MG tablet TAKE (1) TABLET BY MOUTH TWICE A DAY AS NEEDED. 60 tablet 0  . Multiple Vitamin (MULTIVITAMIN WITH MINERALS) TABS tablet Take 1 tablet by mouth daily.    . pantoprazole (PROTONIX) 40 MG tablet Take 40 mg before breakfast every day, take 40 mg  before evening meal as needed. (Patient taking differently: Take 40 mg by mouth 2 (two) times daily. Take 40 mg before breakfast every day, take 40 mg before evening meal as needed.) 180 tablet 3  . traZODone (DESYREL) 100 MG tablet Take 1 tablet by mouth at bedtime.     No current facility-administered medications for this visit.     Allergies as of 01/14/2019 - Review Complete 01/14/2019  Allergen Reaction Noted  . Demerol [meperidine] Nausea And Vomiting 11/21/2011  . Ibuprofen  08/17/2017    Family History  Adopted: Yes  Problem Relation Age of Onset  . Irritable bowel syndrome Son   . Colon cancer Neg Hx   . Gastric cancer Neg Hx   . Esophageal cancer Neg Hx     Social History   Socioeconomic History  . Marital status: Divorced    Spouse name: Not on file  . Number of children: Not on file  . Years of education: Not on file  . Highest education level: Not on file  Occupational History  . Not on file  Social Needs  . Financial resource strain: Not on file  . Food insecurity    Worry: Not on file    Inability: Not on file  . Transportation needs    Medical: Not on file    Non-medical: Not on file  Tobacco Use  . Smoking status: Never Smoker  . Smokeless tobacco: Never Used  Substance and Sexual Activity  . Alcohol use: Yes    Comment: occassionally  . Drug use: No  . Sexual activity: Yes    Birth control/protection: Surgical    Comment: hyst  Lifestyle  . Physical activity    Days per week: Not on file    Minutes per session: Not on file  . Stress: Not on file  Relationships  . Social Herbalist on phone: Not on file    Gets together: Not on file    Attends religious service: Not on file    Active member of club or organization: Not on file    Attends meetings of clubs or organizations: Not on file    Relationship status: Not on file  Other Topics Concern  . Not on file  Social History Narrative  . Not on file    Review of Systems:  Complete ROS negative except as per HPI.   Physical Exam: BP 117/67   Pulse 62   Temp (!) 96.9 F (36.1 C) (Temporal)   Ht _0  (1.778 m)   Wt 165 lb 9.6 oz (75.1 kg)   BMI 23.76 kg/m  General:   Alert and oriented. Pleasant and cooperative. Well-nourished and well-developed.  Eyes:  Without icterus, sclera clear and conjunctiva pink.  Ears:  Normal auditory acuity. Cardiovascular:  S1, S2 present without murmurs appreciated. Extremities without clubbing or edema. Respiratory:  Clear to auscultation bilaterally. No wheezes, rales, or rhonchi. No distress.  Gastrointestinal:  +BS, soft, non-tender and non-distended.  No HSM noted. No guarding or rebound. No masses appreciated.  Rectal:  Deferred  Musculoskalatal:  Symmetrical without gross deformities. Neurologic:  Alert and oriented x4;  grossly normal neurologically. Psych:  Alert and cooperative. Normal mood and affect. Heme/Lymph/Immune: No excessive bruising noted.    01/14/2019 9:00 AM   Disclaimer: This note was dictated with voice recognition software. Similar sounding words can inadvertently be transcribed and may not be corrected upon review.

## 2019-01-19 NOTE — Progress Notes (Signed)
CC'ED TO PCP 

## 2019-01-28 ENCOUNTER — Other Ambulatory Visit: Payer: Self-pay | Admitting: Adult Health

## 2019-01-28 ENCOUNTER — Telehealth: Payer: Self-pay | Admitting: Gastroenterology

## 2019-01-28 DIAGNOSIS — K2970 Gastritis, unspecified, without bleeding: Secondary | ICD-10-CM

## 2019-01-28 DIAGNOSIS — K219 Gastro-esophageal reflux disease without esophagitis: Secondary | ICD-10-CM

## 2019-01-28 NOTE — Telephone Encounter (Signed)
[phone]  PATIENT CALLED AND SAID THE PRILOSEC IS DOING OK BUT SHE IS STILL HAVING BELCHING WITH SOURNESS, HEARTBURN BETTER BUT STILL THERE, BELCHING  UP MEDS

## 2019-01-28 NOTE — Telephone Encounter (Signed)
PT has been taking the Prilosec bid. She feels it should be helping more than it is. She is aware I am sending a message to Randall Hiss and he is off today, but I will let her know when I hear back from him.

## 2019-01-30 ENCOUNTER — Telehealth: Payer: Self-pay | Admitting: Adult Health

## 2019-01-30 NOTE — Telephone Encounter (Signed)

## 2019-01-31 ENCOUNTER — Other Ambulatory Visit: Payer: Self-pay

## 2019-01-31 ENCOUNTER — Encounter: Payer: Self-pay | Admitting: Adult Health

## 2019-01-31 ENCOUNTER — Ambulatory Visit (INDEPENDENT_AMBULATORY_CARE_PROVIDER_SITE_OTHER): Payer: BC Managed Care – PPO | Admitting: Adult Health

## 2019-01-31 VITALS — BP 107/61 | HR 50 | Ht 70.0 in | Wt 164.0 lb

## 2019-01-31 DIAGNOSIS — Z01419 Encounter for gynecological examination (general) (routine) without abnormal findings: Secondary | ICD-10-CM

## 2019-01-31 DIAGNOSIS — Z1211 Encounter for screening for malignant neoplasm of colon: Secondary | ICD-10-CM | POA: Diagnosis not present

## 2019-01-31 DIAGNOSIS — F419 Anxiety disorder, unspecified: Secondary | ICD-10-CM

## 2019-01-31 DIAGNOSIS — Z1212 Encounter for screening for malignant neoplasm of rectum: Secondary | ICD-10-CM | POA: Diagnosis not present

## 2019-01-31 LAB — HEMOCCULT GUIAC POC 1CARD (OFFICE): Fecal Occult Blood, POC: NEGATIVE

## 2019-01-31 MED ORDER — BUSPIRONE HCL 5 MG PO TABS: ORAL_TABLET | ORAL | 12 refills | Status: DC

## 2019-01-31 MED ORDER — LORAZEPAM 0.5 MG PO TABS: ORAL_TABLET | ORAL | 0 refills | Status: DC

## 2019-01-31 MED ORDER — DEXILANT 60 MG PO CPDR: 60.0000 mg | DELAYED_RELEASE_CAPSULE | Freq: Every day | ORAL | 3 refills | Status: DC

## 2019-01-31 NOTE — Addendum Note (Signed)
Addended by: Gordy Levan, ERIC A on: 01/31/2019 02:23 PM   Modules accepted: Orders

## 2019-01-31 NOTE — Telephone Encounter (Signed)
Has recently failed Protonix and Prilosec bid. Will send in [facility] (no samples in office). Let us know if insurance gives a problem. Call with a progress report to let us know if Dexilant is helping in 2 weeks.

## 2019-01-31 NOTE — Progress Notes (Signed)
Patient ID: Julie [name], female   DOB: [birth year], 48 y.o.   MRN: [id number] History of Present Illness: Julie [name] is a 48 year old white female, divorced, sp hysterectomy in for a well woman gyn exam.She had a panic attack recently and was seen in ER. PCP is [place].   Current Medications, Allergies, Past Medical History, Past Surgical History, Family History and Social History were reviewed in [employer] record.     Review of Systems: Patient denies any headaches, hearing loss, fatigue, blurred vision, shortness of breath, chest pain, abdominal pain, problems with bowel movements, urination, or intercourse. No joint pain or mood swings.   Physical Exam:BP 107/61 (BP Location: Right Arm, Patient Position: Sitting, Cuff Size: Normal)   Pulse (!) 50   Ht 5\' 10"  (1.778 m)   Wt 164 lb (74.4 kg)   BMI 23.53 kg/m  General:  Well developed, well nourished, no acute distress Skin:  Warm and dry Neck:  Midline trachea, normal thyroid, good ROM, no lymphadenopathy Lungs; Clear to auscultation bilaterally Breast:  No dominant palpable mass, retraction, or nipple discharge Cardiovascular: Regular rate and rhythm Abdomen:  Soft, non tender, no hepatosplenomegaly Pelvic:  External genitalia is normal in appearance, no lesions.  The vagina is normal in appearance. Urethra has no lesions or masses. The cervix and uterus are absent. No adnexal masses or tenderness noted.Bladder is non tender, no masses felt. Rectal: Good sphincter tone, no polyps, or hemorrhoids felt.  Hemoccult negative. Extremities/musculoskeletal:  No swelling or varicosities noted, no clubbing or cyanosis Psych:  No mood changes, alert and cooperative,seems happy Fall risk is low PHQ 2 score 0. Examination chaperoned by Estill Bamberg Rash LPN.  Impression and Plan: 1. Encounter for well woman exam with routine gynecological exam -Check CBC,CMP,TSH and lipids -physical in 1 year -mammogram yearly   2.  Anxiety -will continue Buspar and ativan Meds ordered this encounter  Medications  . busPIRone (BUSPAR) 5 MG tablet    Sig: TAKE (1) TABLET BY MOUTH (3) TIMES DAILY.    Dispense:  90 tablet    Refill:  12    Order Specific Question:   Supervising Provider    Answer:   Elonda Husky, LUTHER H [[id number]]  . LORazepam (ATIVAN) 0.5 MG tablet    Sig: TAKE (1) TABLET BY MOUTH TWICE A DAY AS NEEDED.    Dispense:  60 tablet    Refill:  0    Order Specific Question:   Supervising Provider    Answer:   Elonda Husky, LUTHER H [[id number]]    3. Screening for colorectal cancer -cologuard or colonoscopy at 71 will be advised

## 2019-02-01 LAB — CBC
Hematocrit: 42 % (ref 34.0–46.6)
Hemoglobin: 14.3 g/dL (ref 11.1–15.9)
MCH: 31.4 pg (ref 26.6–33.0)
MCHC: 34 g/dL (ref 31.5–35.7)
MCV: 92 fL (ref 79–97)
Platelets: 248 10*3/uL (ref 150–450)
RBC: 4.56 x10E6/uL (ref 3.77–5.28)
RDW: 12.7 % (ref 11.7–15.4)
WBC: 6.8 10*3/uL (ref 3.4–10.8)

## 2019-02-01 LAB — COMPREHENSIVE METABOLIC PANEL
ALT: 21 IU/L (ref 0–32)
AST: 17 IU/L (ref 0–40)
Albumin/Globulin Ratio: 2.2 (ref 1.2–2.2)
Albumin: 4.6 g/dL (ref 3.8–4.8)
Alkaline Phosphatase: 79 IU/L (ref 39–117)
BUN/Creatinine Ratio: 17 (ref 9–23)
BUN: 15 mg/dL (ref 6–24)
Bilirubin Total: 0.3 mg/dL (ref 0.0–1.2)
CO2: 26 mmol/L (ref 20–29)
Calcium: 10 mg/dL (ref 8.7–10.2)
Chloride: 103 mmol/L (ref 96–106)
Creatinine, Ser: 0.86 mg/dL (ref 0.57–1.00)
GFR calc Af Amer: 92 mL/min/{1.73_m2} (ref >59)
GFR calc non Af Amer: 80 mL/min/{1.73_m2} (ref >59)
Globulin, Total: 2.1 g/dL (ref 1.5–4.5)
Glucose: 102 mg/dL — ABNORMAL HIGH (ref 65–99)
Potassium: 4.6 mmol/L (ref 3.5–5.2)
Sodium: 141 mmol/L (ref 134–144)
Total Protein: 6.7 g/dL (ref 6.0–8.5)

## 2019-02-01 LAB — LIPID PANEL
Chol/HDL Ratio: 3.5 ratio (ref 0.0–4.4)
Cholesterol, Total: 165 mg/dL (ref 100–199)
HDL: 47 mg/dL (ref >39)
LDL Chol Calc (NIH): 105 mg/dL — ABNORMAL HIGH (ref 0–99)
Triglycerides: 66 mg/dL (ref 0–149)
VLDL Cholesterol Cal: 13 mg/dL (ref 5–40)

## 2019-02-01 LAB — TSH: TSH: 1.59 u[IU]/mL (ref 0.450–4.500)

## 2019-02-03 NOTE — Telephone Encounter (Signed)
PT is aware Rx was sent in and will let me now if there is a problem.

## 2019-02-17 DIAGNOSIS — J069 Acute upper respiratory infection, unspecified: Secondary | ICD-10-CM | POA: Diagnosis not present

## 2019-03-18 ENCOUNTER — Other Ambulatory Visit: Payer: Self-pay

## 2019-03-18 ENCOUNTER — Ambulatory Visit (INDEPENDENT_AMBULATORY_CARE_PROVIDER_SITE_OTHER): Payer: BC Managed Care – PPO | Admitting: Nurse Practitioner

## 2019-03-18 ENCOUNTER — Encounter: Payer: Self-pay | Admitting: Nurse Practitioner

## 2019-03-18 VITALS — BP 113/67 | HR 61 | Temp 97.1°F | Ht 70.0 in | Wt 169.6 lb

## 2019-03-18 DIAGNOSIS — K2970 Gastritis, unspecified, without bleeding: Secondary | ICD-10-CM | POA: Diagnosis not present

## 2019-03-18 DIAGNOSIS — K219 Gastro-esophageal reflux disease without esophagitis: Secondary | ICD-10-CM | POA: Diagnosis not present

## 2019-03-18 DIAGNOSIS — K2990 Gastroduodenitis, unspecified, without bleeding: Secondary | ICD-10-CM | POA: Diagnosis not present

## 2019-03-18 NOTE — Assessment & Plan Note (Signed)
Previously documented gastritis and duodenitis.  She has been on an effective PPI for some time and I feel this is likely resolved.  No overt symptoms of worsening gastritis.  Continue to monitor.  Follow-up in 6 months.

## 2019-03-18 NOTE — Progress Notes (Signed)
Referring Provider: [facility] Primary Care Physician:  [facility] Primary GI:  Dr. Oneida Alar  Chief Complaint  Patient presents with  . Gastroesophageal Reflux    doing better    HPI:   Julie [name] is a 48 y.o. female who presents for follow-up on GERD and flatulence.  The patient was last seen in our office 01/14/2019 for the same.  History of dyspepsia and chest pain.  EGD on file April 2019 with multiple fundic gland polyps, gastritis/duodenitis without H. pylori.  Colonoscopy up-to-date 2018 with recommended repeat in 2028.  She was able to go year without follow-up until emergency department visit 01/08/2019 for precordial chest pain with negative cardiac work-up and likely GERD/gastritis.  At her last visit she had gone back to twice daily PPI with worsening of symptoms in the past month.  Avoids triggers, eat small meals.  She tries to eat small meals, but not always successful.  Typically eats 2 to 3 hours before bed but tries to sit upright.  Recently has been awoken with GERD symptoms.  Gas-X has helped her flatulence, milk helps settle her stomach and GERD symptoms.  No other overt GI complaints.  Recommended stop Protonix, start Prilosec 20 mg twice daily with progress report in 2 to 3 weeks, Tums versus Rolaids as needed, avoid eating 3 to 4 hours prior to bed.  Continue Gas-X versus [place].  Follow-up in 2 months.  She called our office 2 weeks later indicating some minor improvement but no significant change with Prilosec.  She was sent in [place] prescription due to failing to other PPIs.  Requested callback if issue getting insurance to pay for prescription.  No further contact noted.  Today she states she's doing ok overall. Dexilant helping her symptoms much more. Only 1-2 breakthrough episode since starting Dexilant. Denies abdominal pain, N/V, hematochezia, melena, fever, chills, unintentional weight loss. Denies URI or flu-like  symptoms. Denies loss of sense of taste or smell. Denies chest pain, dyspnea, dizziness, lightheadedness, syncope, near syncope. Denies any other upper or lower GI symptoms.  Past Medical History:  Diagnosis Date  . Anxiety   . Arthritis    Right hand ring finger  . Bad odor of urine 09/09/2015  . Complication of anesthesia   . GERD (gastroesophageal reflux disease)   . Headache   . PONV (postoperative nausea and vomiting)     Past Surgical History:  Procedure Laterality Date  . ABDOMINAL HYSTERECTOMY    . BIOPSY  08/14/2017   Procedure: BIOPSY;  Surgeon: Danie Binder, MD;  [facility];  Service: Endoscopy;;  duodenum gastric  . BLADDER SURGERY    . CLOSED REDUCTION FINGER WITH PERCUTANEOUS PINNING Right    Ring finger  . COLONOSCOPY WITH PROPOFOL N/A 01/16/2017   Dr. Oneida Alar: Redundant colon, otherwise normal.  Next anoscopy in 10 years with MAC.  Marland Kitchen ESOPHAGOGASTRODUODENOSCOPY (EGD) WITH PROPOFOL N/A 08/14/2017   Fields: Gastritis, no H. pylori.  Fundic gland polyps.  Small bowel biopsies negative.    Current Outpatient Medications  Medication Sig Dispense Refill  . busPIRone (BUSPAR) 5 MG tablet TAKE (1) TABLET BY MOUTH (3) TIMES DAILY. 90 tablet 12  . dexlansoprazole (DEXILANT) 60 MG capsule Take 1 capsule (60 mg total) by mouth daily. 90 capsule 3  . EPINEPHrine 0.3 mg/0.3 mL IJ SOAJ injection Inject 0.3 mLs into the muscle once as needed.    Marland Kitchen LORazepam (ATIVAN) 0.5 MG tablet TAKE (1) TABLET  BY MOUTH TWICE A DAY AS NEEDED. 60 tablet 0  . Multiple Vitamin (MULTIVITAMIN WITH MINERALS) TABS tablet Take 1 tablet by mouth daily.    . traZODone (DESYREL) 100 MG tablet Take 1 tablet by mouth at bedtime.    Marland Kitchen omeprazole (PRILOSEC) 20 MG capsule Take 1 capsule (20 mg total) by mouth 2 (two) times daily before a meal. 60 capsule 1   No current facility-administered medications for this visit.     Allergies as of 03/18/2019 - Review Complete 03/18/2019  Allergen Reaction  Noted  . Demerol [meperidine] Nausea And Vomiting 11/21/2011  . Ibuprofen  08/17/2017    Family History  Adopted: Yes  Problem Relation Age of Onset  . Irritable bowel syndrome Son   . Colon cancer Neg Hx   . Gastric cancer Neg Hx   . Esophageal cancer Neg Hx     Social History   Socioeconomic History  . Marital status: Divorced    Spouse name: Not on file  . Number of children: Not on file  . Years of education: Not on file  . Highest education level: Not on file  Occupational History  . Not on file  Social Needs  . Financial resource strain: Not on file  . Food insecurity    Worry: Not on file    Inability: Not on file  . Transportation needs    Medical: Not on file    Non-medical: Not on file  Tobacco Use  . Smoking status: Never Smoker  . Smokeless tobacco: Never Used  Substance and Sexual Activity  . Alcohol use: Yes    Comment: occassionally  . Drug use: No  . Sexual activity: Yes    Birth control/protection: Surgical    Comment: hyst  Lifestyle  . Physical activity    Days per week: Not on file    Minutes per session: Not on file  . Stress: Not on file  Relationships  . Social Herbalist on phone: Not on file    Gets together: Not on file    Attends religious service: Not on file    Active member of club or organization: Not on file    Attends meetings of clubs or organizations: Not on file    Relationship status: Not on file  Other Topics Concern  . Not on file  Social History Narrative  . Not on file    Review of Systems: General: Negative for anorexia, weight loss, fever, chills, fatigue, weakness. ENT: Negative for hoarseness, difficulty swallowing. CV: Negative for chest pain, angina, palpitations, peripheral edema.  Respiratory: Negative for dyspnea at rest, cough, sputum, wheezing.  GI: See history of present illness. Endo: Negative for unusual weight change.  Heme: Negative for bruising or bleeding. Allergy: Negative for  rash or hives.   Physical Exam: BP 113/67   Pulse 61   Temp (!) 97.1 F (36.2 C) (Oral)   Ht _0  (1.778 m)   Wt 169 lb 9.6 oz (76.9 kg)   BMI 24.34 kg/m  General:   Alert and oriented. Pleasant and cooperative. Well-nourished and well-developed.  Eyes:  Without icterus, sclera clear and conjunctiva pink.  Ears:  Normal auditory acuity. Cardiovascular:  S1, S2 present without murmurs appreciated. Extremities without clubbing or edema. Respiratory:  Clear to auscultation bilaterally. No wheezes, rales, or rhonchi. No distress.  Gastrointestinal:  +BS, soft, non-tender and non-distended. No HSM noted. No guarding or rebound. No masses appreciated.  Rectal:  Deferred  Musculoskalatal:  Symmetrical without gross deformities. Neurologic:  Alert and oriented x4;  grossly normal neurologically. Psych:  Alert and cooperative. Normal mood and affect. Heme/Lymph/Immune: No excessive bruising noted.    03/18/2019 8:23 AM   Disclaimer: This note was dictated with voice recognition software. Similar sounding words can inadvertently be transcribed and may not be corrected upon review.

## 2019-03-18 NOTE — Assessment & Plan Note (Signed)
GERD symptoms significantly improved on Dexilant.  Recommend she continue to take [place].  Notify us of any worsening or severe symptoms.  Avoid triggers.  Follow-up in 6 months.

## 2019-03-18 NOTE — Patient Instructions (Signed)
Your health issues we discussed today were:   GERD (reflux/heartburn): 1. I am glad you are doing much better on Dexilant! 2. Continue to take Dexilant once a day 3. Avoid trigger foods that make your symptoms worse 4. Try to avoid eating 3 to 4 hours prior to bedtime 5. Let us know if you have any worsening or severe symptoms.  Overall I recommend:  1. Continue your other current medications 2. Return for follow-up in 6 months 3. Call us if you have any questions or concerns.   Because of recent events of COVID-19 ("Coronavirus"), follow CDC recommendations:  1. Wash your hand frequently 2. Avoid touching your face 3. Stay away from people who are sick 4. If you have symptoms such as fever, cough, shortness of breath then call your healthcare provider for further guidance 5. If you are sick, STAY AT HOME unless otherwise directed by your healthcare provider. 6. Follow directions from state and national officials regarding staying safe   At [facility] we value your feedback. You may receive a survey about your visit today. Please share your experience as we strive to create trusting relationships with our patients to provide genuine, compassionate, quality care.  We appreciate your understanding and patience as we review any laboratory studies, imaging, and other diagnostic tests that are ordered as we care for you. Our office policy is 5 business days for review of these results, and any emergent or urgent results are addressed in a timely manner for your best interest. If you do not hear from our office in 1 week, please contact us.   We also encourage the use of MyChart, which contains your medical information for your review as well. If you are not enrolled in this feature, an access code is on this after visit summary for your convenience. Thank you for allowing [place] to be involved in your care.  It was great to see you today!  I hope you have a Happy Thanksgiving!!

## 2019-04-04 DIAGNOSIS — N3946 Mixed incontinence: Secondary | ICD-10-CM | POA: Diagnosis not present

## 2019-04-04 DIAGNOSIS — N3020 Other chronic cystitis without hematuria: Secondary | ICD-10-CM | POA: Diagnosis not present

## 2019-07-31 ENCOUNTER — Other Ambulatory Visit (HOSPITAL_COMMUNITY): Payer: Self-pay | Admitting: Family Medicine

## 2019-07-31 ENCOUNTER — Ambulatory Visit (HOSPITAL_COMMUNITY)
Admission: RE | Admit: 2019-07-31 | Discharge: 2019-07-31 | Disposition: A | Discharge status: Home / Self Care | Payer: 59 | Source: Ambulatory Visit | Attending: Family Medicine | Admitting: Family Medicine

## 2019-07-31 ENCOUNTER — Other Ambulatory Visit: Payer: Self-pay

## 2019-07-31 ENCOUNTER — Other Ambulatory Visit: Payer: Self-pay | Admitting: Family Medicine

## 2019-07-31 DIAGNOSIS — R519 Headache, unspecified: Secondary | ICD-10-CM | POA: Diagnosis present

## 2019-08-05 ENCOUNTER — Encounter: Payer: Self-pay | Admitting: Gastroenterology

## 2019-08-19 DIAGNOSIS — G479 Sleep disorder, unspecified: Secondary | ICD-10-CM | POA: Insufficient documentation

## 2019-08-19 DIAGNOSIS — R110 Nausea: Secondary | ICD-10-CM | POA: Insufficient documentation

## 2019-08-19 DIAGNOSIS — R519 Headache, unspecified: Secondary | ICD-10-CM | POA: Insufficient documentation

## 2019-08-19 DIAGNOSIS — R42 Dizziness and giddiness: Secondary | ICD-10-CM | POA: Insufficient documentation

## 2019-09-16 ENCOUNTER — Ambulatory Visit: Payer: BC Managed Care – PPO | Admitting: Nurse Practitioner

## 2019-09-17 ENCOUNTER — Ambulatory Visit: Payer: 59 | Admitting: Nurse Practitioner

## 2019-09-30 ENCOUNTER — Ambulatory Visit (INDEPENDENT_AMBULATORY_CARE_PROVIDER_SITE_OTHER): Payer: 59 | Admitting: Nurse Practitioner

## 2019-09-30 ENCOUNTER — Encounter: Payer: Self-pay | Admitting: Nurse Practitioner

## 2019-09-30 ENCOUNTER — Other Ambulatory Visit: Payer: Self-pay

## 2019-09-30 VITALS — BP 119/72 | HR 77 | Temp 96.6°F | Ht 70.0 in | Wt 167.8 lb

## 2019-09-30 DIAGNOSIS — K2970 Gastritis, unspecified, without bleeding: Secondary | ICD-10-CM

## 2019-09-30 DIAGNOSIS — K2990 Gastroduodenitis, unspecified, without bleeding: Secondary | ICD-10-CM

## 2019-09-30 DIAGNOSIS — K219 Gastro-esophageal reflux disease without esophagitis: Secondary | ICD-10-CM

## 2019-09-30 NOTE — Assessment & Plan Note (Signed)
Gastritis/duodenitis noted on previous EGD in 2019.  At this point given her good symptomatic control these are both likely resolved.  Recommend she continue her PPI and follow-up 1 year, call for any worsening or severe symptoms.

## 2019-09-30 NOTE — Progress Notes (Signed)
Referring Provider: [facility] Primary Care Physician:  [facility] Primary GI:  Dr. Gala Romney (in the absence of Dr. Oneida Alar); pending Dr. Abbey Chatters  Chief Complaint  Patient presents with  . Gastroesophageal Reflux    has had few episodes    HPI:   Julie [name] is a 49 y.o. female who presents for follow-up on GERD and gastritis.  The patient was last seen in our office 03/18/2019 for the same.  EGD on file April 2019 with multiple fundic gland polyps, gastritis/duodenitis without H. pylori.  Colonoscopy up-to-date 2018 and recommended repeat in 2028.  Previously treated on Protonix but experienced significant breakthrough symptoms so she was switched to Prilosec 20 mg twice daily.  Dietary and lifestyle changes were counseled.  No improvement with Protonix and subsequently switched to [place].  At her last visit she noted significant improvement on Dexilant with only 1-2 breakthrough episodes since changing her medication.  No other overt GI complaints.  Recommended she continue Dexilant daily, reinforced lifestyle habits to help prevent GERD, follow-up in 6 months.  Today she states she's doing well overall. Dexilant continues to keep her GERD under control. Occasional/rare breakthrough with certain foods (if there's something snuck into a food I'm not aware of) but generally avoids triggers. Denies abdominal pain, N/V, hematochezia, melena, fever, chills, unintentional weight loss. Denies URI or flu-like symptoms. Denies loss of sense of taste or smell. The patient has received COVID-19 vaccination(s). Denies chest pain, dyspnea, dizziness, lightheadedness, syncope, near syncope. Denies any other upper or lower GI symptoms.  Past Medical History:  Diagnosis Date  . Anxiety   . Arthritis    Right hand ring finger  . Bad odor of urine 09/09/2015  . Complication of anesthesia   . GERD (gastroesophageal reflux disease)   . Headache   . PONV  (postoperative nausea and vomiting)     Past Surgical History:  Procedure Laterality Date  . ABDOMINAL HYSTERECTOMY    . BIOPSY  08/14/2017   Procedure: BIOPSY;  Surgeon: Danie Binder, MD;  [facility];  Service: Endoscopy;;  duodenum gastric  . BLADDER SURGERY    . CLOSED REDUCTION FINGER WITH PERCUTANEOUS PINNING Right    Ring finger  . COLONOSCOPY WITH PROPOFOL N/A 01/16/2017   Dr. Oneida Alar: Redundant colon, otherwise normal.  Next anoscopy in 10 years with MAC.  Marland Kitchen ESOPHAGOGASTRODUODENOSCOPY (EGD) WITH PROPOFOL N/A 08/14/2017   Fields: Gastritis, no H. pylori.  Fundic gland polyps.  Small bowel biopsies negative.    Current Outpatient Medications  Medication Sig Dispense Refill  . busPIRone (BUSPAR) 5 MG tablet TAKE (1) TABLET BY MOUTH (3) TIMES DAILY. 90 tablet 12  . dexlansoprazole (DEXILANT) 60 MG capsule Take 1 capsule (60 mg total) by mouth daily. 90 capsule 3  . EPINEPHrine 0.3 mg/0.3 mL IJ SOAJ injection Inject 0.3 mLs into the muscle once as needed.    Marland Kitchen LORazepam (ATIVAN) 0.5 MG tablet TAKE (1) TABLET BY MOUTH TWICE A DAY AS NEEDED. 60 tablet 0  . Multiple Vitamin (MULTIVITAMIN WITH MINERALS) TABS tablet Take 1 tablet by mouth daily.    . nortriptyline (PAMELOR) 10 MG capsule Take 30 mg by mouth at bedtime.    . traZODone (DESYREL) 100 MG tablet Take 50 tablets by mouth at bedtime.     Marland Kitchen omeprazole (PRILOSEC) 20 MG capsule Take 1 capsule (20 mg total) by mouth 2 (two) times daily before a meal. 60 capsule 1   No current  facility-administered medications for this visit.    Allergies as of 09/30/2019 - Review Complete 09/30/2019  Allergen Reaction Noted  . Demerol [meperidine] Nausea And Vomiting 11/21/2011  . Ibuprofen  08/17/2017    Family History  Adopted: Yes  Problem Relation Age of Onset  . Irritable bowel syndrome Son   . Colon cancer Neg Hx   . Gastric cancer Neg Hx   . Esophageal cancer Neg Hx     Social History   Socioeconomic History  .  Marital status: Divorced    Spouse name: Not on file  . Number of children: Not on file  . Years of education: Not on file  . Highest education level: Not on file  Occupational History  . Not on file  Tobacco Use  . Smoking status: Never Smoker  . Smokeless tobacco: Never Used  Substance and Sexual Activity  . Alcohol use: Yes    Comment: occassionally  . Drug use: No  . Sexual activity: Yes    Birth control/protection: Surgical    Comment: hyst  Other Topics Concern  . Not on file  Social History Narrative  . Not on file   Social Determinants of Health   Financial Resource Strain:   . Difficulty of Paying Living Expenses:   Food Insecurity:   . Worried About Charity fundraiser in the Last Year:   . Arboriculturist in the Last Year:   Transportation Needs:   . Film/video editor (Medical):   Marland Kitchen Lack of Transportation (Non-Medical):   Physical Activity:   . Days of Exercise per Week:   . Minutes of Exercise per Session:   Stress:   . Feeling of Stress :   Social Connections:   . Frequency of Communication with Friends and Family:   . Frequency of Social Gatherings with Friends and Family:   . Attends Religious Services:   . Active Member of Clubs or Organizations:   . Attends Archivist Meetings:   Marland Kitchen Marital Status:     Subjective: Review of Systems  Constitutional: Negative for chills, fever, malaise/fatigue and weight loss.  HENT: Negative for congestion and sore throat.   Respiratory: Negative for cough and shortness of breath.   Cardiovascular: Negative for chest pain and palpitations.  Gastrointestinal: Negative for abdominal pain, blood in stool, diarrhea, melena, nausea and vomiting.  Musculoskeletal: Negative for joint pain and myalgias.  Skin: Negative for rash.  Neurological: Negative for dizziness and weakness.  Endo/Heme/Allergies: Does not bruise/bleed easily.  Psychiatric/Behavioral: Negative for depression. The patient is not  nervous/anxious.   All other systems reviewed and are negative.    Objective: BP 119/72   Pulse 77   Temp (!) 96.6 F (35.9 C) (Temporal)   Ht _0  (1.778 m)   Wt 167 lb 12.8 oz (76.1 kg)   BMI 24.08 kg/m  Physical Exam Vitals and nursing note reviewed.  Constitutional:      General: She is not in acute distress.    Appearance: Normal appearance. She is well-developed. She is not ill-appearing, toxic-appearing or diaphoretic.  HENT:     Head: Normocephalic and atraumatic.     Nose: No congestion or rhinorrhea.  Eyes:     General: No scleral icterus. Cardiovascular:     Rate and Rhythm: Normal rate and regular rhythm.     Heart sounds: Normal heart sounds.  Pulmonary:     Effort: Pulmonary effort is normal. No respiratory distress.     Breath  sounds: Normal breath sounds.  Abdominal:     General: Bowel sounds are normal.     Palpations: Abdomen is soft. There is no hepatomegaly, splenomegaly or mass.     Tenderness: There is no abdominal tenderness. There is no guarding or rebound.     Hernia: No hernia is present.  Skin:    General: Skin is warm and dry.     Coloration: Skin is not jaundiced.     Findings: No rash.  Neurological:     General: No focal deficit present.     Mental Status: She is alert and oriented to person, place, and time.  Psychiatric:        Attention and Perception: Attention normal.        Mood and Affect: Mood normal.        Speech: Speech normal.        Behavior: Behavior normal.        Thought Content: Thought content normal.        Cognition and Memory: Cognition and memory normal.       09/30/2019 8:33 AM   Disclaimer: This note was dictated with voice recognition software. Similar sounding words can inadvertently be transcribed and may not be corrected upon review.

## 2019-09-30 NOTE — Assessment & Plan Note (Signed)
Previous GERD symptoms initially trialed Protonix and Prilosec, both of which were failed.  Has found significant relief on Dexilant.  She continues to avoid triggers as much as she is able, although occasionally "something sneaks in that I am unaware of."  When she does have breakthrough symptoms she uses "rescue medication" such as Tums, Rolaids, milk, etc. which provides her temporary relief.  Otherwise, Dexilant controls her symptoms quite well.  Recommend she continue Dexilant 60 mg once a day.  We will have her follow-up in 1 year for long-term clinical progression.  Call for any worsening symptoms before then.

## 2019-09-30 NOTE — Patient Instructions (Addendum)
Your health issues we discussed today were:   GERD (reflux/heartburn): 1. I am glad you are doing better! 2. Continue taking Dexilant once a day as you have been 3. Call us if you have any worsening or recurrent symptoms  Overall I recommend:  1. Continue your other current medications 2. Return for follow-up in 1 year 3. Call us if you have any questions or concerns   ---------------------------------------------------------------  I am glad you have gotten your COVID-19 vaccination!  Even though you are fully vaccinated you should continue to follow CDC and state/local guidelines.  ---------------------------------------------------------------   At [facility] we value your feedback. You may receive a survey about your visit today. Please share your experience as we strive to create trusting relationships with our patients to provide genuine, compassionate, quality care.  We appreciate your understanding and patience as we review any laboratory studies, imaging, and other diagnostic tests that are ordered as we care for you. Our office policy is 5 business days for review of these results, and any emergent or urgent results are addressed in a timely manner for your best interest. If you do not hear from our office in 1 week, please contact us.   We also encourage the use of MyChart, which contains your medical information for your review as well. If you are not enrolled in this feature, an access code is on this after visit summary for your convenience. Thank you for allowing [place] to be involved in your care.  It was great to see you today!  I hope you have a great Summer!!

## 2019-10-14 ENCOUNTER — Other Ambulatory Visit (HOSPITAL_COMMUNITY): Payer: Self-pay | Admitting: Adult Health

## 2019-10-14 DIAGNOSIS — Z1231 Encounter for screening mammogram for malignant neoplasm of breast: Secondary | ICD-10-CM

## 2019-10-20 ENCOUNTER — Telehealth: Payer: Self-pay | Admitting: Emergency Medicine

## 2019-10-20 ENCOUNTER — Encounter: Payer: 59 | Admitting: Adult Health

## 2019-10-20 NOTE — Telephone Encounter (Signed)
Did PA FOR DEXILANT Key: [id number] pts insurance company states she must try  Lansoprazole Omeprazole Pantoprazole Sodium RABEprazole Sodium Esomeprazole Magnesium PriLOSEC OTC Prevacid 24HR NexIUM 24HR  pt has tried and failed Protonix and Prilosec  (last seen EG)

## 2019-10-21 ENCOUNTER — Telehealth: Payer: Self-pay | Admitting: Internal Medicine

## 2019-10-21 NOTE — Telephone Encounter (Signed)
Pt called this afternoon saying that [facility] would not refill her prescription because it'll need a PA. She doesn't know the name of the medication and she spoke to LL yesterday. Please advise. [phone]

## 2019-10-21 NOTE — Progress Notes (Signed)
This encounter was created in error - please disregard.

## 2019-10-22 NOTE — Telephone Encounter (Signed)
error 

## 2019-10-22 NOTE — Telephone Encounter (Signed)
Does she have to try each of the PPIs listed below to get Dexilant? She has failed protonix and prilosec.

## 2019-10-22 NOTE — Telephone Encounter (Signed)
Routing to LL 

## 2019-10-23 MED ORDER — LANSOPRAZOLE 30 MG PO CPDR: 30.0000 mg | DELAYED_RELEASE_CAPSULE | Freq: Every day | ORAL | 3 refills | Status: DC

## 2019-10-23 NOTE — Telephone Encounter (Signed)
Spoke to pt and apologized for the delay and notified I am waiting on an alternative medication and I will notify her as soon as I can

## 2019-10-23 NOTE — Addendum Note (Signed)
Addended by: Tiffany Kocher on: 10/23/2019 01:54 PM   Modules accepted: Orders

## 2019-10-23 NOTE — Telephone Encounter (Signed)
rx sent for lansoprazole once daily, most closely related to Dexilant. Please let pt know.

## 2019-10-23 NOTE — Telephone Encounter (Signed)
Called pt notified her I will f/u with her tomorrow about medication for acid reflux pt thanked me for the call

## 2019-10-23 NOTE — Telephone Encounter (Signed)
Called [facility] [employer]. The insurance company stated pt must try and fail all medications below before they will approve dexilant 60mg .

## 2019-10-24 NOTE — Telephone Encounter (Signed)
Called pt notified her of insurance recommendations. Pt is upset that her insurance requires her to try and fail acid reflux medications listed below. Pt States dexilant is working well for her. I notified pt of the price of dexilant with good rx card. It is $194, pt stated she is unable to afford that and she will call us in 2 weeks to let us know if the lansoprazole once daily is working. Notified pt that rx was sent into [facility]

## 2019-10-29 ENCOUNTER — Telehealth: Payer: Self-pay | Admitting: Internal Medicine

## 2019-10-29 NOTE — Telephone Encounter (Signed)
She can increase the Prevacid to 30 mg twice daily (once in the morning before breakfast and once in the evening 30 mins before her last meal of the day). She can use the pills she has on hand.  Call us in 2 weeks with any results. If needed we can send in a bid dosing Rx or trial another medication. See updated list of tried/failed per record review below.  Pts insurance company states she must try:   Lansoprazole TRYING NOW Omeprazole: FAILED Pantoprazole Sodium: FAILED RABEprazole Sodium: Has NOT Tried Esomeprazole Magnesium: Has NOT Tried PriLOSEC OTC: FAILED Prevacid 24HR: TRYING NOW NexIUM 24HR: FAILED

## 2019-10-29 NOTE — Telephone Encounter (Signed)
PATIENT CALLED AND SAID THAT THE PREVICID THAT WAS CALLED IN  IS NOT WORKING    [phone].  WAS VERY SICK THIS WEEKEND.  ASKED ABOUT IT BEING INCREASED OR TRYING SOMETHING ELSE.

## 2019-10-29 NOTE — Telephone Encounter (Signed)
Pt insurance company denied dexilant and stated she must try and fail several other medications before they will approve it. Ll sent in previcid but pt stated it is not working and wants to increase it. Is she able? (see previous note)

## 2019-10-29 NOTE — Telephone Encounter (Signed)
Notified pt. She will increase to twice a day once 30 mins before breakfast then once 30  mins before dinner. Pt thanked me for the call and stated she will call us and let us know in 2 weeks if it is working or not

## 2019-11-10 ENCOUNTER — Telehealth: Payer: Self-pay | Admitting: Emergency Medicine

## 2019-11-10 DIAGNOSIS — K219 Gastro-esophageal reflux disease without esophagitis: Secondary | ICD-10-CM

## 2019-11-10 NOTE — Telephone Encounter (Signed)
Pt called and stated  Prevacid to 30 mg twice daily once in the morning before breakfast and once in the evening 30 mins before her last meal of the day is not working and she wants to know if it can be increased or changed to something else

## 2019-11-13 ENCOUNTER — Telehealth: Payer: Self-pay | Admitting: Emergency Medicine

## 2019-11-13 NOTE — Telephone Encounter (Signed)
Pt called and wanted to follow up with her call on 7/12. I notified her that the provider was not in the office yesterday and that he is currently with pts. She stated her acid reflux medication is not working

## 2019-11-14 MED ORDER — RABEPRAZOLE SODIUM 20 MG PO TBEC: 20.0000 mg | DELAYED_RELEASE_TABLET | Freq: Every day | ORAL | 2 refills | Status: DC

## 2019-11-14 NOTE — Telephone Encounter (Signed)
Pt returned call and I notified her of recommendations and that rx was sent into [facility]

## 2019-11-14 NOTE — Addendum Note (Signed)
Addended by: Delane Ginger, Kaislyn Gulas A on: 11/14/2019 10:38 AM   Modules accepted: Orders

## 2019-11-14 NOTE — Telephone Encounter (Signed)
Updated list of tried/failed: Lansoprazole - triad/failed Omeprazole - tried/failed Pantoprazole Sodium - tried failed RABEprazole Sodium Esomeprazole Magnesium PriLOSEC OTC - Duplicate Prevacid 24HR - Duplicate NexIUM 24HR - Duplicate  Will send in Rx for rabeprazole 20mg  daily. Please call in 2 weeks with progress report.

## 2019-11-14 NOTE — Telephone Encounter (Signed)
See previous note. New PPI sent in as part of EXTENSIVE trial/failure required per insurance

## 2019-11-14 NOTE — Telephone Encounter (Signed)
Called lmom

## 2019-11-17 NOTE — Telephone Encounter (Signed)
noted 

## 2020-01-30 ENCOUNTER — Ambulatory Visit (HOSPITAL_COMMUNITY)
Admission: RE | Admit: 2020-01-30 | Discharge: 2020-01-30 | Disposition: A | Discharge status: Home / Self Care | Payer: 59 | Source: Ambulatory Visit | Attending: Adult Health | Admitting: Adult Health

## 2020-01-30 ENCOUNTER — Other Ambulatory Visit: Payer: Self-pay

## 2020-01-30 DIAGNOSIS — Z1231 Encounter for screening mammogram for malignant neoplasm of breast: Secondary | ICD-10-CM | POA: Diagnosis present

## 2020-02-02 ENCOUNTER — Encounter: Payer: Self-pay | Admitting: Adult Health

## 2020-02-02 ENCOUNTER — Ambulatory Visit (INDEPENDENT_AMBULATORY_CARE_PROVIDER_SITE_OTHER): Payer: 59 | Admitting: Adult Health

## 2020-02-02 VITALS — BP 125/79 | HR 72 | Ht 70.0 in | Wt 170.0 lb

## 2020-02-02 DIAGNOSIS — M545 Low back pain, unspecified: Secondary | ICD-10-CM | POA: Diagnosis not present

## 2020-02-02 DIAGNOSIS — Z01419 Encounter for gynecological examination (general) (routine) without abnormal findings: Secondary | ICD-10-CM | POA: Diagnosis not present

## 2020-02-02 DIAGNOSIS — Z1211 Encounter for screening for malignant neoplasm of colon: Secondary | ICD-10-CM | POA: Diagnosis not present

## 2020-02-02 DIAGNOSIS — F419 Anxiety disorder, unspecified: Secondary | ICD-10-CM | POA: Diagnosis not present

## 2020-02-02 LAB — POCT URINALYSIS DIPSTICK
Blood, UA: NEGATIVE
Glucose, UA: NEGATIVE
Ketones, UA: NEGATIVE
Leukocytes, UA: NEGATIVE
Nitrite, UA: POSITIVE
Protein, UA: NEGATIVE

## 2020-02-02 LAB — HEMOCCULT GUIAC POC 1CARD (OFFICE): Fecal Occult Blood, POC: NEGATIVE

## 2020-02-02 MED ORDER — BUSPIRONE HCL 5 MG PO TABS: ORAL_TABLET | ORAL | 12 refills | Status: DC

## 2020-02-02 NOTE — Progress Notes (Signed)
Patient ID: [name], female   DOB: [birth year], 49 y.o.   MRN: [id number] History of Present Illness:  Julie [name] is a 49 year old white female, divorced, sp hysterectomy in for a well woman gyn exam. PCP is [place].  Current Medications, Allergies, Past Medical History, Past Surgical History, Family History and Social History were reviewed in [employer] record.     Review of Systems: Patient denies any  hearing loss, fatigue, blurred vision, shortness of breath, chest pain, abdominal pain, problems with bowel movements, or intercourse. No joint pain or mood swings. Has had some low back pain and up at night to pee, is on daily antibiotic from urology, but does not know the name, and urine has odor. Has tension headaches and sees Dr Malvin Johns and is on nortriptyline at bedtime   Physical Exam:BP 125/79 (BP Location: Left Arm, Patient Position: Sitting, Cuff Size: Normal)   Pulse 72   Ht 5\' 10"  (1.778 m)   Wt 170 lb (77.1 kg)   BMI 24.39 kg/m urine +nitrates. General:  Well developed, well nourished, no acute distress Skin:  Warm and dry,tan Neck:  Midline trachea, normal thyroid, good ROM, no lymphadenopathy Lungs; Clear to auscultation bilaterally Breast:  No dominant palpable mass, retraction, or nipple discharge Cardiovascular: Regular rate and rhythm Abdomen:  Soft, non tender, no hepatosplenomegaly Pelvic:  External genitalia is normal in appearance, no lesions.  The vagina is normal in appearance. Urethra has no lesions or masses. The cervix and uterus are absent.  No adnexal masses or tenderness noted.Bladder is non tender, no masses felt. Rectal: Good sphincter tone, no polyps, or hemorrhoids felt.  Hemoccult negative. Extremities/musculoskeletal:  No swelling or varicosities noted, no clubbing or cyanosis Psych:  No mood changes, alert and cooperative,seems happy AA is 4 Fall risk is low PHQ 9 score is 4, no SI, on buspar for anxiety  Upstream -  02/02/20 1116      Pregnancy Intention Screening   Does the patient want to become pregnant in the next year? N/A   hysterectomy   Does the patient's partner want to become pregnant in the next year? N/A    Would the patient like to discuss contraceptive options today? N/A      Contraception Wrap Up   Current Method --   hyterectomy   End Method --   hysterectomy   Contraception Counseling Provided No         Verbal consent given for exam without a chaperone.  Impression and plan; 1. Low back pain, unspecified back pain laterality, unspecified chronicity, unspecified whether sciatica present UA C&S sent, will await culture before giving antibiotic   2. Encounter for well woman exam with routine gynecological exam Physical in 1 year Check CBC,CMP,TSH and lipids Had mammogram on Friday Colonoscopy per GI  3. Encounter for screening fecal occult blood testing   4. Anxiety Continue buspar Meds ordered this encounter  Medications  . busPIRone (BUSPAR) 5 MG tablet    Sig: TAKE (1) TABLET BY MOUTH (3) TIMES DAILY.    Dispense:  90 tablet    Refill:  12    Order Specific Question:   Supervising Provider    Answer:   Friday H [[id number]]

## 2020-02-03 LAB — MICROSCOPIC EXAMINATION: Casts: NONE SEEN /lpf

## 2020-02-03 LAB — COMPREHENSIVE METABOLIC PANEL
ALT: 45 IU/L — ABNORMAL HIGH (ref 0–32)
AST: 29 IU/L (ref 0–40)
Albumin/Globulin Ratio: 2 (ref 1.2–2.2)
Albumin: 4.6 g/dL (ref 3.8–4.8)
Alkaline Phosphatase: 100 IU/L (ref 44–121)
BUN/Creatinine Ratio: 14 (ref 9–23)
BUN: 11 mg/dL (ref 6–24)
Bilirubin Total: 0.3 mg/dL (ref 0.0–1.2)
CO2: 26 mmol/L (ref 20–29)
Calcium: 9.5 mg/dL (ref 8.7–10.2)
Chloride: 100 mmol/L (ref 96–106)
Creatinine, Ser: 0.8 mg/dL (ref 0.57–1.00)
GFR calc Af Amer: 99 mL/min/{1.73_m2} (ref >59)
GFR calc non Af Amer: 86 mL/min/{1.73_m2} (ref >59)
Globulin, Total: 2.3 g/dL (ref 1.5–4.5)
Glucose: 93 mg/dL (ref 65–99)
Potassium: 4.8 mmol/L (ref 3.5–5.2)
Sodium: 140 mmol/L (ref 134–144)
Total Protein: 6.9 g/dL (ref 6.0–8.5)

## 2020-02-03 LAB — LIPID PANEL
Chol/HDL Ratio: 3.8 ratio (ref 0.0–4.4)
Cholesterol, Total: 219 mg/dL — ABNORMAL HIGH (ref 100–199)
HDL: 58 mg/dL (ref >39)
LDL Chol Calc (NIH): 142 mg/dL — ABNORMAL HIGH (ref 0–99)
Triglycerides: 109 mg/dL (ref 0–149)
VLDL Cholesterol Cal: 19 mg/dL (ref 5–40)

## 2020-02-03 LAB — URINALYSIS, ROUTINE W REFLEX MICROSCOPIC
Bilirubin, UA: NEGATIVE
Glucose, UA: NEGATIVE
Ketones, UA: NEGATIVE
Nitrite, UA: NEGATIVE
Protein,UA: NEGATIVE
RBC, UA: NEGATIVE
Specific Gravity, UA: 1.022 (ref 1.005–1.030)
Urobilinogen, Ur: 0.2 mg/dL (ref 0.2–1.0)
pH, UA: 7.5 (ref 5.0–7.5)

## 2020-02-03 LAB — CBC
Hematocrit: 46.4 % (ref 34.0–46.6)
Hemoglobin: 15.3 g/dL (ref 11.1–15.9)
MCH: 30.7 pg (ref 26.6–33.0)
MCHC: 33 g/dL (ref 31.5–35.7)
MCV: 93 fL (ref 79–97)
Platelets: 293 10*3/uL (ref 150–450)
RBC: 4.98 x10E6/uL (ref 3.77–5.28)
RDW: 12.2 % (ref 11.7–15.4)
WBC: 8.3 10*3/uL (ref 3.4–10.8)

## 2020-02-03 LAB — TSH: TSH: 2.15 u[IU]/mL (ref 0.450–4.500)

## 2020-02-04 ENCOUNTER — Other Ambulatory Visit (HOSPITAL_COMMUNITY): Payer: Self-pay | Admitting: Adult Health

## 2020-02-04 ENCOUNTER — Telehealth: Payer: Self-pay | Admitting: Adult Health

## 2020-02-04 DIAGNOSIS — R928 Other abnormal and inconclusive findings on diagnostic imaging of breast: Secondary | ICD-10-CM

## 2020-02-04 NOTE — Telephone Encounter (Signed)
Patient called stating that she would like a call back from [place], patient seen on her mychart the results of her Mammogram and has some question. Please contact pt

## 2020-02-04 NOTE — Telephone Encounter (Signed)
Pt saw mammogram and will cll xray to get [place] appointment. She is aware of labs and that urine prelim. Shows E coli, culture pending. Can rx but may need to change, so will await final results. Continue diet and exercise.

## 2020-02-05 LAB — URINE CULTURE

## 2020-02-06 ENCOUNTER — Telehealth: Payer: Self-pay | Admitting: Adult Health

## 2020-02-06 MED ORDER — SULFAMETHOXAZOLE-TRIMETHOPRIM 800-160 MG PO TABS: 1.0000 | ORAL_TABLET | Freq: Two times a day (BID) | ORAL | 0 refills | Status: DC

## 2020-02-06 NOTE — Telephone Encounter (Signed)
Pt aware urine + E coli and that septra ds sent to [facility]

## 2020-02-09 ENCOUNTER — Other Ambulatory Visit: Payer: Self-pay | Admitting: Nurse Practitioner

## 2020-02-09 DIAGNOSIS — K219 Gastro-esophageal reflux disease without esophagitis: Secondary | ICD-10-CM

## 2020-02-24 ENCOUNTER — Ambulatory Visit (HOSPITAL_COMMUNITY)
Admission: RE | Admit: 2020-02-24 | Discharge: 2020-02-24 | Disposition: A | Discharge status: Home / Self Care | Payer: 59 | Source: Ambulatory Visit | Attending: Adult Health | Admitting: Adult Health

## 2020-02-24 ENCOUNTER — Other Ambulatory Visit: Payer: Self-pay

## 2020-02-24 DIAGNOSIS — R928 Other abnormal and inconclusive findings on diagnostic imaging of breast: Secondary | ICD-10-CM | POA: Diagnosis not present

## 2020-03-02 ENCOUNTER — Encounter (HOSPITAL_COMMUNITY): Payer: 59

## 2020-03-02 ENCOUNTER — Other Ambulatory Visit (HOSPITAL_COMMUNITY): Payer: 59

## 2020-06-09 ENCOUNTER — Other Ambulatory Visit: Payer: Self-pay | Admitting: Gastroenterology

## 2020-06-09 DIAGNOSIS — K219 Gastro-esophageal reflux disease without esophagitis: Secondary | ICD-10-CM

## 2020-07-22 ENCOUNTER — Ambulatory Visit (HOSPITAL_COMMUNITY)
Admission: RE | Admit: 2020-07-22 | Discharge: 2020-07-22 | Disposition: A | Discharge status: Home / Self Care | Payer: 59 | Source: Ambulatory Visit | Attending: Family Medicine | Admitting: Family Medicine

## 2020-07-22 ENCOUNTER — Other Ambulatory Visit: Payer: Self-pay | Admitting: Family Medicine

## 2020-07-22 ENCOUNTER — Other Ambulatory Visit: Payer: Self-pay

## 2020-07-22 ENCOUNTER — Other Ambulatory Visit (HOSPITAL_COMMUNITY): Payer: Self-pay | Admitting: Family Medicine

## 2020-07-22 DIAGNOSIS — M79661 Pain in right lower leg: Secondary | ICD-10-CM | POA: Insufficient documentation

## 2020-09-09 ENCOUNTER — Other Ambulatory Visit: Payer: Self-pay | Admitting: Gastroenterology

## 2020-09-09 DIAGNOSIS — K219 Gastro-esophageal reflux disease without esophagitis: Secondary | ICD-10-CM

## 2020-10-14 ENCOUNTER — Encounter: Payer: Self-pay | Admitting: Internal Medicine

## 2020-12-14 ENCOUNTER — Ambulatory Visit
Admission: EM | Admit: 2020-12-14 | Discharge: 2020-12-14 | Disposition: A | Discharge status: Home / Self Care | Payer: 59

## 2020-12-14 ENCOUNTER — Other Ambulatory Visit: Payer: Self-pay

## 2020-12-14 DIAGNOSIS — Z1152 Encounter for screening for COVID-19: Secondary | ICD-10-CM

## 2020-12-14 DIAGNOSIS — U071 COVID-19: Secondary | ICD-10-CM | POA: Diagnosis not present

## 2020-12-14 DIAGNOSIS — B349 Viral infection, unspecified: Secondary | ICD-10-CM

## 2020-12-14 NOTE — ED Provider Notes (Signed)
[facility]    CSN: [id number] Arrival date & time: 12/14/20  1320      History   Chief Complaint Chief Complaint  Patient presents with   Cough   Nasal Congestion    HPI Julie [name] is a 50 y.o. female.   HPI Patient presents with URI symptoms including cough, nasal congestion, and runny nose x 1 day.Unknown of COVID exposure. Denies worrisome symptoms of shortness of breath, weakness, N&V, or chest pain.   Past Medical History:  Diagnosis Date   Anxiety    Arthritis    Right hand ring finger   Bad odor of urine 5/73/2202   Complication of anesthesia    GERD (gastroesophageal reflux disease)    Headache    PONV (postoperative nausea and vomiting)     Patient Active Problem List   Diagnosis Date Noted   Low back pain 02/02/2020   Encounter for screening fecal occult blood testing 02/02/2020   Encounter for well woman exam with routine gynecological exam 01/31/2019   Flatulence 01/14/2019   GERD (gastroesophageal reflux disease) 01/04/2018   Skin tag 10/04/2017   Screening for colorectal cancer 10/04/2017   Well woman exam with routine gynecological exam 10/04/2017   Tired 10/04/2017   Screening for thyroid disorder 10/04/2017   Screening cholesterol level 10/04/2017   Gastritis and gastroduodenitis 10/01/2017   Constipation 10/01/2017   Dyspepsia 06/26/2017   Chest pain 06/26/2017   Screening for genitourinary condition 09/13/2016   Heme positive stool 09/13/2016   Bad odor of urine 09/09/2015   Anxiety 09/17/2013    Past Surgical History:  Procedure Laterality Date   ABDOMINAL HYSTERECTOMY     BIOPSY  08/14/2017   Procedure: BIOPSY;  Surgeon: Danie Binder, MD;  Location: [facility];  Service: Endoscopy;;  duodenum gastric   BLADDER SURGERY     CLOSED REDUCTION FINGER WITH PERCUTANEOUS PINNING Right    Ring finger   COLONOSCOPY WITH PROPOFOL N/A 01/16/2017   Dr. Oneida Alar: Redundant colon, otherwise normal.  Next anoscopy in 10  years with MAC.   ESOPHAGOGASTRODUODENOSCOPY (EGD) WITH PROPOFOL N/A 08/14/2017   Fields: Gastritis, no H. pylori.  Fundic gland polyps.  Small bowel biopsies negative.    OB History     Gravida  2   Para  2   Term      Preterm      AB      Living  2      SAB      IAB      Ectopic      Multiple      Live Births               Home Medications    Prior to Admission medications   Medication Sig Start Date End Date Taking? Authorizing Provider  busPIRone (BUSPAR) 5 MG tablet TAKE (1) TABLET BY MOUTH (3) TIMES DAILY. 02/02/20   Estill Dooms, NP  EPINEPHrine 0.3 mg/0.3 mL IJ SOAJ injection Inject 0.3 mLs into the muscle once as needed. 08/06/18   [provider]  Multiple Vitamin (MULTIVITAMIN WITH MINERALS) TABS tablet Take 1 tablet by mouth daily.    [provider]  nortriptyline (PAMELOR) 10 MG capsule Take 30 mg by mouth at bedtime. 09/16/19   [provider]  nortriptyline (PAMELOR) 50 MG capsule Take 50 mg by mouth at bedtime. 11/23/20   [provider]  RABEprazole (ACIPHEX) 20 MG tablet TAKE (1) TABLET BY MOUTH ONCE DAILY.  09/10/20   Mahala Menghini, PA-C  sulfamethoxazole-trimethoprim (BACTRIM DS) 800-160 MG tablet Take 1 tablet by mouth 2 (two) times daily. Take 1 bid 02/06/20   Derrek Monaco A, NP  traZODone (DESYREL) 100 MG tablet Take 50 tablets by mouth at bedtime.  05/21/18   [provider]    Family History Family History  Adopted: Yes  Problem Relation Age of Onset   Irritable bowel syndrome Son    Colon cancer Neg Hx    Gastric cancer Neg Hx    Esophageal cancer Neg Hx     Social History Social History   Tobacco Use   Smoking status: Never   Smokeless tobacco: Never  Vaping Use   Vaping Use: Never used  Substance Use Topics   Alcohol use: Yes    Comment: occassionally   Drug use: No     Allergies   Demerol [meperidine] and Ibuprofen   Review of Systems Review of  Systems   Physical Exam Triage Vital Signs ED Triage Vitals  Enc Vitals Group     BP 12/14/20 1407 119/79     Pulse Rate 12/14/20 1407 (!) 102     Resp 12/14/20 1407 20     Temp 12/14/20 1407 99.6 F (37.6 C)     Temp src --      SpO2 12/14/20 1407 95 %     Weight --      Height --      Head Circumference --      Peak Flow --      Pain Score 12/14/20 1404 0     Pain Loc --      Pain Edu? --      Excl. in [place]? --    No data found.  Updated Vital Signs BP 119/79   Pulse (!) 102   Temp 99.6 F (37.6 C)   Resp 20   SpO2 95%   Visual Acuity Right Eye Distance:   Left Eye Distance:   Bilateral Distance:    Right Eye Near:   Left Eye Near:    Bilateral Near:     Physical Exam  General Appearance:    Alert, cooperative, no distress  HENT:   Normocephalic, ears normal, nares mucosal edema with congestion, rhinorrhea, oropharynx patent   Eyes:    PERRL, conjunctiva/corneas clear, EOM's intact       Lungs:     Clear to auscultation bilaterally, respirations unlabored  Heart:    Regular rate and rhythm  Neurologic:   Awake, alert, oriented x 3. No apparent focal neurological           defect.      UC Treatments / Results  Labs (all labs ordered are listed, but only abnormal results are displayed) Labs Reviewed  COVID-19, FLU A+B NAA - Abnormal; Notable for the following components:      Result Value   SARS-CoV-2, NAA Detected (*)    All other components within normal limits   Narrative:    Performed at:  [address] [place], [place]  [id number] Lab Director: Rush Farmer MD, Phone:  [phone]  NOVEL CORONAVIRUS, NAA    EKG   Radiology No results found.  Procedures Procedures (including critical care time)  Medications Ordered in UC Medications - No data to display  Initial Impression / Assessment and Plan / UC Course  I have reviewed the triage vital signs and the nursing notes.  Pertinent labs & imaging results that  were available during my care of the patient were reviewed by me and considered in my medical decision making (see chart for details).    COVID/Flu test pending. Symptom management warranted only.  Manage fever with Tylenol and ibuprofen.  Nasal symptoms with over-the-counter antihistamines recommended.  Treatment per discharge medications/discharge instructions.  Red flags/ER precautions given. The most current CDC isolation/quarantine recommendation advised.    Final Clinical Impressions(s) / UC Diagnoses   Final diagnoses:  Encounter for screening for COVID-19  Viral illness     Discharge Instructions      Your COVID 19 results should result within 2-4 days. Negative results are immediately resulted to Mychart. Positive results will receive a follow-up call from our clinic. If symptoms are present, I recommend home quarantine until results are known.  Alternate Tylenol and ibuprofen as needed for body aches and fever.  Symptom management per recommendations discussed today.  If any breathing difficulty or chest pain develops go immediately to the closest emergency department for evaluation.      ED Prescriptions   None    PDMP not reviewed this encounter.   Scot Jun, FNP 12/21/20 2223

## 2020-12-14 NOTE — ED Triage Notes (Signed)
Pt presents with cough and nasal congestion that began yesterday  

## 2020-12-14 NOTE — Discharge Instructions (Addendum)
Your COVID 19 results should result within 2-4 days. Negative results are immediately resulted to Mychart. Positive results will receive a follow-up call from our clinic. If symptoms are present, I recommend home quarantine until results are known.  Alternate Tylenol and ibuprofen as needed for body aches and fever.  Symptom management per recommendations discussed today.  If any breathing difficulty or chest pain develops go immediately to the closest emergency department for evaluation.  

## 2020-12-16 LAB — COVID-19, FLU A+B NAA
Influenza A, NAA: NOT DETECTED
Influenza B, NAA: NOT DETECTED
SARS-CoV-2, NAA: DETECTED — AB

## 2021-02-01 ENCOUNTER — Other Ambulatory Visit (HOSPITAL_COMMUNITY): Payer: Self-pay | Admitting: Adult Health

## 2021-02-01 DIAGNOSIS — Z1231 Encounter for screening mammogram for malignant neoplasm of breast: Secondary | ICD-10-CM

## 2021-02-02 ENCOUNTER — Other Ambulatory Visit: Payer: Self-pay

## 2021-02-02 ENCOUNTER — Ambulatory Visit (HOSPITAL_COMMUNITY)
Admission: RE | Admit: 2021-02-02 | Discharge: 2021-02-02 | Disposition: A | Discharge status: Home / Self Care | Payer: 59 | Source: Ambulatory Visit | Attending: Adult Health | Admitting: Adult Health

## 2021-02-02 DIAGNOSIS — Z1231 Encounter for screening mammogram for malignant neoplasm of breast: Secondary | ICD-10-CM | POA: Diagnosis not present

## 2021-02-07 ENCOUNTER — Ambulatory Visit (INDEPENDENT_AMBULATORY_CARE_PROVIDER_SITE_OTHER): Payer: 59 | Admitting: Adult Health

## 2021-02-07 ENCOUNTER — Encounter: Payer: Self-pay | Admitting: Adult Health

## 2021-02-07 ENCOUNTER — Other Ambulatory Visit: Payer: Self-pay

## 2021-02-07 VITALS — BP 129/80 | HR 91 | Ht 70.0 in | Wt 182.0 lb

## 2021-02-07 DIAGNOSIS — Z1211 Encounter for screening for malignant neoplasm of colon: Secondary | ICD-10-CM

## 2021-02-07 DIAGNOSIS — Z01419 Encounter for gynecological examination (general) (routine) without abnormal findings: Secondary | ICD-10-CM

## 2021-02-07 DIAGNOSIS — F419 Anxiety disorder, unspecified: Secondary | ICD-10-CM | POA: Diagnosis not present

## 2021-02-07 LAB — HEMOCCULT GUIAC POC 1CARD (OFFICE): Fecal Occult Blood, POC: NEGATIVE

## 2021-02-07 NOTE — Progress Notes (Signed)
Patient ID: Julie [name], female   DOB: 1970-06-04, 50 y.o.   MRN: [id number] History of Present Illness: Julie [name] is a 50 year old white female,divorced, sp hysterectomy in for a well woman gyn exam. PCP is [place].   Current Medications, Allergies, Past Medical History, Past Surgical History, Family History and Social History were reviewed in [employer] record.     Review of Systems:  Patient denies any headaches, hearing loss, fatigue, blurred vision, shortness of breath, chest pain, abdominal pain, problems with bowel movements, urination, or intercourse. No joint pain or mood swings.  Has gained some weight, still working 2 jobs  Physical Exam:BP 129/80 (BP Location: Left Arm, Patient Position: Sitting, Cuff Size: Normal)   Pulse 91   Ht 5\' 10"  (1.778 m)   Wt 182 lb (82.6 kg)   BMI 26.11 kg/m   General:  Well developed, well nourished, no acute distress Skin:  Warm and dry Neck:  Midline trachea, normal thyroid, good ROM, no lymphadenopathy Lungs; Clear to auscultation bilaterally Breast:  No dominant palpable mass, retraction, or nipple discharge Cardiovascular: Regular rate and rhythm Abdomen:  Soft, non tender, no hepatosplenomegaly Pelvic:  External genitalia is normal in appearance, no lesions.  The vagina is normal in appearance. Urethra has no lesions or masses. The cervix and uterus are absent.No adnexal masses or tenderness noted.Bladder is non tender, no masses felt. Rectal: Good sphincter tone, no polyps, or hemorrhoids felt.  Hemoccult negative. Extremities/musculoskeletal:  No swelling or varicosities noted, no clubbing or cyanosis Psych:  No mood changes, alert and cooperative,seems happy AA is 3 Fall risk is low Depression screen [facility] 2/9 02/07/2021 02/02/2020 01/31/2019  Decreased Interest 0 0 0  Down, Depressed, Hopeless 0 0 0  PHQ - 2 Score 0 0 0  Altered sleeping 0 2 -  Tired, decreased energy 0 2 -  Change in appetite 0 0 -   Feeling bad or failure about yourself  0 0 -  Trouble concentrating 0 0 -  Moving slowly or fidgety/restless 0 0 -  Suicidal thoughts 0 0 -  PHQ-9 Score 0 4 -  Difficult doing work/chores - Not difficult at all -    GAD 7 : Generalized Anxiety Score 02/07/2021 02/02/2020  Nervous, Anxious, on Edge 0 0  Control/stop worrying 0 0  Worry too much - different things 0 1  Trouble relaxing 0 0  Restless 0 0  Easily annoyed or irritable 0 0  Afraid - awful might happen 0 0  Total GAD 7 Score 0 1  Anxiety Difficulty - Not difficult at all      Up[place] - 02/07/21 0843       Pregnancy Intention Screening   Does the patient want to become pregnant in the next year? N/A    Does the patient's partner want to become pregnant in the next year? N/A    Would the patient like to discuss contraceptive options today? No      Contraception Wrap Up   Current Method Female Sterilization   hyst   End Method Female Sterilization   hyst   Contraception Counseling Provided No            Examination chaperoned by 04/09/21.   Impression and Plan: 1. Encounter for well woman exam with routine gynecological exam Physical in 1 year She requests labs.  Check CBC,CMP,TSH and lipids  Mammogram yearly,had 02/02/21,negative for malignancy   - CBC - Comprehensive metabolic panel - TSH - Lipid  panel  2. Encounter for screening fecal occult blood testing  - POCT occult blood stool  3. Anxiety Neurologist refilled buspar last time she thinks

## 2021-02-11 LAB — LIPID PANEL
Chol/HDL Ratio: 3.8 ratio (ref 0.0–4.4)
Cholesterol, Total: 184 mg/dL (ref 100–199)
HDL: 48 mg/dL (ref >39)
LDL Chol Calc (NIH): 114 mg/dL — ABNORMAL HIGH (ref 0–99)
Triglycerides: 125 mg/dL (ref 0–149)
VLDL Cholesterol Cal: 22 mg/dL (ref 5–40)

## 2021-02-11 LAB — COMPREHENSIVE METABOLIC PANEL
ALT: 25 IU/L (ref 0–32)
AST: 25 IU/L (ref 0–40)
Albumin/Globulin Ratio: 2 (ref 1.2–2.2)
Albumin: 4.3 g/dL (ref 3.8–4.9)
Alkaline Phosphatase: 100 IU/L (ref 44–121)
BUN/Creatinine Ratio: 14 (ref 9–23)
BUN: 10 mg/dL (ref 6–24)
Bilirubin Total: 0.4 mg/dL (ref 0.0–1.2)
CO2: 23 mmol/L (ref 20–29)
Calcium: 9.6 mg/dL (ref 8.7–10.2)
Chloride: 100 mmol/L (ref 96–106)
Creatinine, Ser: 0.71 mg/dL (ref 0.57–1.00)
Globulin, Total: 2.2 g/dL (ref 1.5–4.5)
Glucose: 97 mg/dL (ref 70–99)
Potassium: 4.8 mmol/L (ref 3.5–5.2)
Sodium: 139 mmol/L (ref 134–144)
Total Protein: 6.5 g/dL (ref 6.0–8.5)
eGFR: 103 mL/min/{1.73_m2} (ref >59)

## 2021-02-11 LAB — CBC
Hematocrit: 42.9 % (ref 34.0–46.6)
Hemoglobin: 14.3 g/dL (ref 11.1–15.9)
MCH: 30.4 pg (ref 26.6–33.0)
MCHC: 33.3 g/dL (ref 31.5–35.7)
MCV: 91 fL (ref 79–97)
Platelets: 296 10*3/uL (ref 150–450)
RBC: 4.71 x10E6/uL (ref 3.77–5.28)
RDW: 13.1 % (ref 11.7–15.4)
WBC: 6.3 10*3/uL (ref 3.4–10.8)

## 2021-02-11 LAB — TSH: TSH: 2.92 u[IU]/mL (ref 0.450–4.500)

## 2021-02-22 ENCOUNTER — Encounter: Payer: Self-pay | Admitting: Gastroenterology

## 2021-02-22 ENCOUNTER — Other Ambulatory Visit: Payer: Self-pay

## 2021-02-22 ENCOUNTER — Ambulatory Visit (INDEPENDENT_AMBULATORY_CARE_PROVIDER_SITE_OTHER): Payer: 59 | Admitting: Gastroenterology

## 2021-02-22 VITALS — BP 127/76 | HR 84 | Temp 98.0°F | Ht 70.0 in | Wt 181.6 lb

## 2021-02-22 DIAGNOSIS — K219 Gastro-esophageal reflux disease without esophagitis: Secondary | ICD-10-CM | POA: Diagnosis not present

## 2021-02-22 NOTE — Patient Instructions (Signed)
Start weaning off Aciphex to see if you still need the medication. For next two weeks, take every other day. The following two weeks, take about 3 times per week. After this you can stop completely. If you are able to control your symptoms with diet and only occasional antacid you can stay off the medication.  Colonoscopy due in 2028. Return to the office as needed.

## 2021-02-22 NOTE — Progress Notes (Signed)
Primary Care Physician: [facility]  Primary Gastroenterologist:  Hennie Duos. Marletta Lor, DO   Chief Complaint  Patient presents with   Gastroesophageal Reflux    Will have occas symptoms but learning what she can/can't eat    HPI: Julie [name] is a 50 y.o. female here for follow up of GERD.  Patient last seen November 2020.  EGD April 2019 with multiple fundic gland polyps, gastritis/duodenitis without H. pylori.  Colonoscopy up-to-date 2018, recommended follow-up in 2028.  Clinically patient doing well from a GI standpoint.  Reflux well controlled.  She has gained 10 pounds in the past year.  She is trying to watch what she eats, avoiding trigger foods.  She would like to try to come off of PPI therapy if possible.  She denies any dysphagia, vomiting, abdominal pain, constipation, diarrhea, melena, rectal bleeding.    Current Outpatient Medications  Medication Sig Dispense Refill   busPIRone (BUSPAR) 5 MG tablet TAKE (1) TABLET BY MOUTH (3) TIMES DAILY. 90 tablet 12   clonazePAM (KLONOPIN) 0.5 MG tablet Take by mouth.     EMGALITY 120 MG/ML SOAJ SMARTSIG:120 Milligram(s) SUB-Q Every 4 Weeks     EPINEPHrine 0.3 mg/0.3 mL IJ SOAJ injection Inject 0.3 mLs into the muscle once as needed.     Multiple Vitamin (MULTIVITAMIN WITH MINERALS) TABS tablet Take 1 tablet by mouth daily.     nortriptyline (PAMELOR) 50 MG capsule Take 50 mg by mouth at bedtime.     RABEprazole (ACIPHEX) 20 MG tablet TAKE (1) TABLET BY MOUTH ONCE DAILY. 30 tablet 5   traZODone (DESYREL) 100 MG tablet Take 100 mg by mouth at bedtime.     No current facility-administered medications for this visit.    Allergies as of 02/22/2021 - Review Complete 02/22/2021  Allergen Reaction Noted   Demerol [meperidine] Nausea And Vomiting 11/21/2011   Ibuprofen Other (See Comments) 08/17/2017    ROS:  General: Negative for anorexia, weight loss, fever, chills, fatigue, weakness. ENT: Negative  for hoarseness, difficulty swallowing , nasal congestion. CV: Negative for chest pain, angina, palpitations, dyspnea on exertion, peripheral edema.  Respiratory: Negative for dyspnea at rest, dyspnea on exertion, cough, sputum, wheezing.  GI: See history of present illness. GU:  Negative for dysuria, hematuria, urinary incontinence, urinary frequency, nocturnal urination.  Endo: Negative for unusual weight change.    Physical Examination:   BP 127/76   Pulse 84   Temp 98 F (36.7 C)   Ht 5\' 10"  (1.778 m)   Wt 181 lb 9.6 oz (82.4 kg)   BMI 26.06 kg/m   General: Well-nourished, well-developed in no acute distress.  Eyes: No icterus. Mouth: masked Abdomen: Bowel sounds are normal, nontender, nondistended, no hepatosplenomegaly or masses, no abdominal bruits or hernia , no rebound or guarding.   Extremities: No lower extremity edema. No clubbing or deformities. Neuro: Alert and oriented x 4   Skin: Warm and dry, no jaundice.   Psych: Alert and cooperative, normal mood and affect.  Labs:  Lab Results  Component Value Date   TSH 2.920 02/10/2021   Lab Results  Component Value Date   WBC 6.3 02/10/2021   HGB 14.3 02/10/2021   HCT 42.9 02/10/2021   MCV 91 02/10/2021   PLT 296 02/10/2021     Lab Results  Component Value Date   ALT 25 02/10/2021   AST 25 02/10/2021   ALKPHOS 100 02/10/2021   BILITOT 0.4 02/10/2021    Imaging  Studies: MM 3D SCREEN BREAST BILATERAL  Result Date: 02/06/2021 CLINICAL DATA:  Screening. EXAM: DIGITAL SCREENING BILATERAL MAMMOGRAM WITH TOMOSYNTHESIS AND CAD TECHNIQUE: Bilateral screening digital craniocaudal and mediolateral oblique mammograms were obtained. Bilateral screening digital breast tomosynthesis was performed. The images were evaluated with computer-aided detection. COMPARISON:  Previous exam(s). ACR Breast Density Category c: The breast tissue is heterogeneously dense, which may obscure small masses. FINDINGS: There are no findings  suspicious for malignancy. IMPRESSION: No mammographic evidence of malignancy. A result letter of this screening mammogram will be mailed directly to the patient. RECOMMENDATION: Screening mammogram in one year. (Code:SM-B-01Y) BI-RADS CATEGORY  1: Negative. Electronically Signed   By: Frederico Hamman M.D.   On: 02/06/2021 09:17     Assessment/Plan:  GERD: Doing very well at this time.  She is interested in coming off of daily PPI.  Encouraged antireflux measures.  To start weaning off of Aciphex, initially taking every other day for a couple of weeks followed by 3 times a week for couple of weeks and then stopping.  If tolerated she will continue with over-the-counter antacids only as needed, avoiding trigger foods.  She will call if she has any alarm symptoms or other problems. She will keep [place] posted if she requires ongoing PPI. Otherwise we will see her back as needed.   Colon cancer screening: Recently negative.  Due for another colonoscopy in 2028.

## 2021-02-28 ENCOUNTER — Ambulatory Visit (HOSPITAL_COMMUNITY): Payer: 59

## 2021-04-02 ENCOUNTER — Emergency Department (HOSPITAL_COMMUNITY)
Admission: EM | Admit: 2021-04-02 | Discharge: 2021-04-03 | Disposition: A | Discharge status: Home / Self Care | Payer: 59 | Attending: Emergency Medicine | Admitting: Emergency Medicine

## 2021-04-02 ENCOUNTER — Encounter (HOSPITAL_COMMUNITY): Payer: Self-pay

## 2021-04-02 ENCOUNTER — Other Ambulatory Visit: Payer: Self-pay

## 2021-04-02 DIAGNOSIS — G4489 Other headache syndrome: Secondary | ICD-10-CM

## 2021-04-02 DIAGNOSIS — H53149 Visual discomfort, unspecified: Secondary | ICD-10-CM | POA: Diagnosis not present

## 2021-04-02 DIAGNOSIS — R110 Nausea: Secondary | ICD-10-CM | POA: Insufficient documentation

## 2021-04-02 DIAGNOSIS — R519 Headache, unspecified: Secondary | ICD-10-CM | POA: Insufficient documentation

## 2021-04-02 MED ORDER — ONDANSETRON 4 MG PO TBDP
4.0000 mg | ORAL_TABLET | Freq: Once | ORAL | Status: AC
  Administered 2021-04-02: 4 mg via ORAL
  Filled 2021-04-02: qty 1

## 2021-04-02 MED ORDER — PROCHLORPERAZINE EDISYLATE 10 MG/2ML IJ SOLN
10.0000 mg | Freq: Once | INTRAMUSCULAR | Status: AC
  Administered 2021-04-02: 10 mg via INTRAMUSCULAR
  Filled 2021-04-02: qty 2

## 2021-04-02 MED ORDER — DIPHENHYDRAMINE HCL 25 MG PO CAPS
25.0000 mg | ORAL_CAPSULE | Freq: Once | ORAL | Status: AC
  Administered 2021-04-02: 25 mg via ORAL
  Filled 2021-04-02: qty 1

## 2021-04-02 NOTE — ED Provider Notes (Addendum)
[facility] EMERGENCY DEPARTMENT Provider Note   CSN: [id number] Arrival date & time: 04/02/21  2222     History Chief Complaint  Patient presents with   Headache    Julie [name] is a 50 y.o. female with longstanding history of headaches followed by neurology and on Emgality who presents the emergency department with a 2-day history of headache that has been constant and worsening.  Patient took her Emgality injection this morning which did not help.  She describes her headache as a bandlike sensation across her head. She rates her headache moderate in severity.  She reports associated nausea but denies any vomiting, diarrhea, abdominal pain, fever, chills, focal weakness/numbness.   Headache     Past Medical History:  Diagnosis Date   Anxiety    Arthritis    Right hand ring finger   Bad odor of urine 9/83/3825   Complication of anesthesia    GERD (gastroesophageal reflux disease)    Headache    PONV (postoperative nausea and vomiting)     Patient Active Problem List   Diagnosis Date Noted   Low back pain 02/02/2020   Encounter for screening fecal occult blood testing 02/02/2020   Encounter for well woman exam with routine gynecological exam 01/31/2019   Flatulence 01/14/2019   GERD (gastroesophageal reflux disease) 01/04/2018   Skin tag 10/04/2017   Screening for colorectal cancer 10/04/2017   Well woman exam with routine gynecological exam 10/04/2017   Tired 10/04/2017   Screening for thyroid disorder 10/04/2017   Screening cholesterol level 10/04/2017   Gastritis and gastroduodenitis 10/01/2017   Constipation 10/01/2017   Dyspepsia 06/26/2017   Chest pain 06/26/2017   Screening for genitourinary condition 09/13/2016   Heme positive stool 09/13/2016   Bad odor of urine 09/09/2015   Anxiety 09/17/2013    Past Surgical History:  Procedure Laterality Date   ABDOMINAL HYSTERECTOMY     BIOPSY  08/14/2017   Procedure: BIOPSY;  Surgeon: Danie Binder,  MD;  Location: [facility];  Service: Endoscopy;;  duodenum gastric   BLADDER SURGERY     CLOSED REDUCTION FINGER WITH PERCUTANEOUS PINNING Right    Ring finger   COLONOSCOPY WITH PROPOFOL N/A 01/16/2017   Dr. Oneida Alar: Redundant colon, otherwise normal.  Next anoscopy in 10 years with MAC.   ESOPHAGOGASTRODUODENOSCOPY (EGD) WITH PROPOFOL N/A 08/14/2017   Fields: Gastritis, no H. pylori.  Fundic gland polyps.  Small bowel biopsies negative.     OB History     Gravida  2   Para  2   Term      Preterm      AB      Living  2      SAB      IAB      Ectopic      Multiple      Live Births              Family History  Adopted: Yes  Problem Relation Age of Onset   Irritable bowel syndrome Son    Colon cancer Neg Hx    Gastric cancer Neg Hx    Esophageal cancer Neg Hx     Social History   Tobacco Use   Smoking status: Never   Smokeless tobacco: Never  Vaping Use   Vaping Use: Never used  Substance Use Topics   Alcohol use: Yes    Comment: occassionally   Drug use: No    Home Medications Prior to Admission medications  Medication Sig Start Date End Date Taking? Authorizing Provider  busPIRone (BUSPAR) 5 MG tablet TAKE (1) TABLET BY MOUTH (3) TIMES DAILY. 02/02/20   Estill Dooms, NP  clonazePAM (KLONOPIN) 0.5 MG tablet Take by mouth. 01/26/21   [provider]  EMGALITY 120 MG/ML SOAJ SMARTSIG:120 Milligram(s) SUB-Q Every 4 Weeks 01/31/21   [provider]  EPINEPHrine 0.3 mg/0.3 mL IJ SOAJ injection Inject 0.3 mLs into the muscle once as needed. 08/06/18   [provider]  Multiple Vitamin (MULTIVITAMIN WITH MINERALS) TABS tablet Take 1 tablet by mouth daily.    [provider]  nortriptyline (PAMELOR) 50 MG capsule Take 50 mg by mouth at bedtime. 11/23/20   [provider]  RABEprazole (ACIPHEX) 20 MG tablet TAKE (1) TABLET BY MOUTH ONCE DAILY. 09/10/20   Mahala Menghini, PA-C  traZODone (DESYREL) 100 MG  tablet Take 100 mg by mouth at bedtime. 05/21/18   [provider]    Allergies    Demerol [meperidine] and Ibuprofen  Review of Systems   Review of Systems  Neurological:  Positive for headaches.  All other systems reviewed and are negative.  Physical Exam Updated Vital Signs BP (!) 152/85   Pulse 93   Temp 98.1 F (36.7 C) (Oral)   Resp 18   Ht _0  (1.778 m)   Wt 79.4 kg   SpO2 100%   BMI 25.11 kg/m   Physical Exam Vitals and nursing note reviewed.  Constitutional:      Appearance: Normal appearance.  HENT:     Head: Normocephalic and atraumatic.     Comments: Scalp is tender to palpation.  Eyes:     General:        Right eye: No discharge.        Left eye: No discharge.     Conjunctiva/sclera: Conjunctivae normal.  Pulmonary:     Effort: Pulmonary effort is normal.  Skin:    General: Skin is warm and dry.     Findings: No rash.  Neurological:     General: No focal deficit present.     Mental Status: She is alert.     Comments: Cranial nerves II through XII are intact.  She does have some photophobia with direct light.  5/5 strength to the upper and lower extremities.  Normal sensation of the upper and lower extremities.  Normal finger-nose.  Normal gait.  Psychiatric:        Mood and Affect: Mood normal.        Behavior: Behavior normal.    ED Results / Procedures / Treatments   Labs (all labs ordered are listed, but only abnormal results are displayed) Labs Reviewed - No data to display  EKG None  Radiology No results found.  Procedures Procedures   Medications Ordered in ED Medications  prochlorperazine (COMPAZINE) injection 10 mg (10 mg Intramuscular Given 04/02/21 2302)  diphenhydrAMINE (BENADRYL) capsule 25 mg (25 mg Oral Given 04/02/21 2302)  ondansetron (ZOFRAN-ODT) disintegrating tablet 4 mg (4 mg Oral Given 04/02/21 2302)    ED Course  I have reviewed the triage vital signs and the nursing notes.  Pertinent labs & imaging  results that were available during my care of the patient were reviewed by me and considered in my medical decision making (see chart for details).    MDM Rules/Calculators/A&P  Julie [name] is a 50 y.o. female who presents to the emergency department with a headache.  History and physical exam is reassuring.  I have a low suspicion right now for stroke or emergent intracranial pathology.  She has a completely normal neuro exam.  I will give her a migraine cocktail and observe. I anticipate discharge. The rest of her care will be transferred to Dr. Christy Gentles.  Final Clinical Impression(s) / ED Diagnoses Final diagnoses:  None    Rx / DC Orders ED Discharge Orders     None        Hendricks Limes, PA-C 04/02/21 2306    Myna Bright Dwight, PA-C 04/02/21 2307    Ripley Fraise, MD 04/03/21 [phone]

## 2021-04-02 NOTE — ED Triage Notes (Signed)
Pt reports headache that started yesterday; hx of migraines and takes Emgality injections. She also reports eye pain and nausea.

## 2021-04-03 MED ORDER — LACTATED RINGERS IV BOLUS
1000.0000 mL | Freq: Once | INTRAVENOUS | Status: AC
  Administered 2021-04-03: 1000 mL via INTRAVENOUS

## 2021-04-03 MED ORDER — METOCLOPRAMIDE HCL 5 MG/ML IJ SOLN
10.0000 mg | Freq: Once | INTRAMUSCULAR | Status: AC
  Administered 2021-04-03: 10 mg via INTRAVENOUS
  Filled 2021-04-03: qty 2

## 2021-04-03 MED ORDER — DIPHENHYDRAMINE HCL 50 MG/ML IJ SOLN
25.0000 mg | Freq: Once | INTRAMUSCULAR | Status: AC
  Administered 2021-04-03: 25 mg via INTRAVENOUS
  Filled 2021-04-03: qty 1

## 2021-04-03 NOTE — Discharge Instructions (Signed)

## 2021-05-10 ENCOUNTER — Telehealth: Payer: Self-pay

## 2021-05-10 NOTE — Telephone Encounter (Signed)
PA for Rabeprazole Sodium 20 mg tablets has been approved from 05/10/21 through 05/09/22. Approval letter to be scanned into patient's chart.

## 2021-05-11 ENCOUNTER — Emergency Department (HOSPITAL_COMMUNITY)
Admission: EM | Admit: 2021-05-11 | Discharge: 2021-05-11 | Disposition: A | Discharge status: Home / Self Care | Payer: BC Managed Care – PPO | Attending: Emergency Medicine | Admitting: Emergency Medicine

## 2021-05-11 ENCOUNTER — Encounter (HOSPITAL_COMMUNITY): Payer: Self-pay

## 2021-05-11 ENCOUNTER — Other Ambulatory Visit: Payer: Self-pay

## 2021-05-11 ENCOUNTER — Emergency Department (HOSPITAL_COMMUNITY): Payer: BC Managed Care – PPO

## 2021-05-11 DIAGNOSIS — R079 Chest pain, unspecified: Secondary | ICD-10-CM | POA: Diagnosis not present

## 2021-05-11 DIAGNOSIS — R0789 Other chest pain: Secondary | ICD-10-CM | POA: Diagnosis not present

## 2021-05-11 LAB — CBC
HCT: 43.7 % (ref 36.0–46.0)
Hemoglobin: 15.2 g/dL — ABNORMAL HIGH (ref 12.0–15.0)
MCH: 32.1 pg (ref 26.0–34.0)
MCHC: 34.8 g/dL (ref 30.0–36.0)
MCV: 92.2 fL (ref 80.0–100.0)
Platelets: 284 10*3/uL (ref 150–400)
RBC: 4.74 MIL/uL (ref 3.87–5.11)
RDW: 12.4 % (ref 11.5–15.5)
WBC: 7.7 10*3/uL (ref 4.0–10.5)
nRBC: 0 % (ref 0.0–0.2)

## 2021-05-11 LAB — D-DIMER, QUANTITATIVE: D-Dimer, Quant: 0.51 ug/mL-FEU — ABNORMAL HIGH (ref 0.00–0.50)

## 2021-05-11 LAB — HEPATIC FUNCTION PANEL
ALT: 35 U/L (ref 0–44)
AST: 29 U/L (ref 15–41)
Albumin: 4.4 g/dL (ref 3.5–5.0)
Alkaline Phosphatase: 86 U/L (ref 38–126)
Bilirubin, Direct: 0.1 mg/dL (ref 0.0–0.2)
Indirect Bilirubin: 0.4 mg/dL (ref 0.3–0.9)
Total Bilirubin: 0.5 mg/dL (ref 0.3–1.2)
Total Protein: 7.5 g/dL (ref 6.5–8.1)

## 2021-05-11 LAB — BASIC METABOLIC PANEL
Anion gap: 11 (ref 5–15)
BUN: 15 mg/dL (ref 6–20)
CO2: 24 mmol/L (ref 22–32)
Calcium: 9.9 mg/dL (ref 8.9–10.3)
Chloride: 103 mmol/L (ref 98–111)
Creatinine, Ser: 0.92 mg/dL (ref 0.44–1.00)
GFR, Estimated: >60 mL/min (ref >60)
Glucose, Bld: 96 mg/dL (ref 70–99)
Potassium: 3.8 mmol/L (ref 3.5–5.1)
Sodium: 138 mmol/L (ref 135–145)

## 2021-05-11 LAB — POC URINE PREG, ED: Preg Test, Ur: NEGATIVE

## 2021-05-11 LAB — TROPONIN I (HIGH SENSITIVITY): Troponin I (High Sensitivity): <2 ng/L (ref <18)

## 2021-05-11 MED ORDER — DICYCLOMINE HCL 10 MG PO CAPS
20.0000 mg | ORAL_CAPSULE | Freq: Once | ORAL | Status: AC
  Administered 2021-05-11: 20 mg via ORAL
  Filled 2021-05-11: qty 2

## 2021-05-11 MED ORDER — ALUM & MAG HYDROXIDE-SIMETH 200-200-20 MG/5ML PO SUSP
30.0000 mL | Freq: Once | ORAL | Status: AC
  Administered 2021-05-11: 30 mL via ORAL
  Filled 2021-05-11: qty 30

## 2021-05-11 MED ORDER — ONDANSETRON HCL 4 MG/2ML IJ SOLN
4.0000 mg | Freq: Once | INTRAMUSCULAR | Status: AC
  Administered 2021-05-11: 4 mg via INTRAVENOUS
  Filled 2021-05-11: qty 2

## 2021-05-11 NOTE — ED Notes (Signed)
Pt restful.

## 2021-05-11 NOTE — ED Notes (Signed)
Taken to xray.

## 2021-05-11 NOTE — Discharge Instructions (Addendum)
Lab work imaging all reassuring, I suspect this is from acid reflux, would rather continue taking Tums, Pepcid, Maalox this will help with your GI symptoms.  Please follow your PCP for further evaluation.  Come back to the emergency department if you develop chest pain, shortness of breath, severe abdominal pain, uncontrolled nausea, vomiting, diarrhea.

## 2021-05-11 NOTE — ED Notes (Signed)
Pt states had heartburn this am and took tums with no relief. Pt states got worse and not going away so came to ED. Advised pt to take slow deep breaths due to mild hyperventilating.  Pt demonstrated. Nondiaphoretic. A/o. C/o nausea and some dizziness with driving on the way here. Pt states she does have hx of panic attacks and trying not to have one.

## 2021-05-11 NOTE — ED Provider Notes (Signed)
[facility] EMERGENCY DEPARTMENT Provider Note   CSN: [id number] Arrival date & time: 05/11/21  1129     History  Chief Complaint  Patient presents with   Chest Pain    Julie [name] is a 51 y.o. female.  HPI  Patient without significant medical history presents to the emergency department with complaints of chest pain.  Patient states that started last night, came on after she ate some lunch, states pain was originally in the middle of her chest and started radiating to the left side of her chest, states the pain has been constant, describes as a pressure-like sensation, no associated shortness of breath but states she does has some pleuritic chest pain, denies becoming diaphoretic, nausea or vomiting, states she is felt slightly lightheaded, denies any peripheral edema or orthopnea.  She has no cardiac history, no history of PEs or DVTs currently not on hormone therapy, denies any unilateral leg swelling, leg pain, she denies any illicit drug use, does not smoke, no history of hypertension, hyperlipidemia, no family history of cardiac abnormalities.  States that she has tried some acid reflux medication not much relief, she has had GERD in the past but states this feels different.  Home Medications Prior to Admission medications   Medication Sig Start Date End Date Taking? Authorizing Provider  busPIRone (BUSPAR) 5 MG tablet TAKE (1) TABLET BY MOUTH (3) TIMES DAILY. Patient taking differently: Take 5 mg by mouth 3 (three) times daily. 02/02/20  Yes Cyril Mourning A, NP  clonazePAM (KLONOPIN) 0.5 MG tablet Take 0.25 mg by mouth 2 (two) times daily. 01/26/21  Yes [provider]  EMGALITY 120 MG/ML SOAJ Inject 120 mg into the skin every 28 (twenty-eight) days. 01/31/21  Yes [provider]  Multiple Vitamin (MULTIVITAMIN WITH MINERALS) TABS tablet Take 1 tablet by mouth daily.   Yes [provider]  nitrofurantoin, macrocrystal-monohydrate, (MACROBID) 100  MG capsule Take 100 mg by mouth daily. 04/19/21  Yes [provider]  nortriptyline (PAMELOR) 50 MG capsule Take 20 mg by mouth at bedtime. 11/23/20  Yes [provider]  rizatriptan (MAXALT) 10 MG tablet Take 10 mg by mouth as needed for migraine. 05/10/21  Yes [provider]  traZODone (DESYREL) 100 MG tablet Take 100 mg by mouth at bedtime. 05/21/18  Yes [provider]  EPINEPHrine 0.3 mg/0.3 mL IJ SOAJ injection Inject 0.3 mLs into the muscle once as needed. 08/06/18   [provider]  RABEprazole (ACIPHEX) 20 MG tablet TAKE (1) TABLET BY MOUTH ONCE DAILY. Patient not taking: Reported on 05/11/2021 09/10/20   Tiffany Kocher, PA-C      Allergies    Compazine [prochlorperazine], Demerol [meperidine], and Ibuprofen    Review of Systems   Review of Systems  Constitutional:  Negative for chills and fever.  Respiratory:  Positive for chest tightness. Negative for shortness of breath.   Cardiovascular:  Positive for chest pain.  Gastrointestinal:  Negative for abdominal pain, diarrhea, nausea and vomiting.  Neurological:  Negative for headaches.   Physical Exam Updated Vital Signs BP 120/84    Pulse 96    Temp 98 F (36.7 C) (Oral)    Resp 20    Ht 5\' 10"  (1.778 m)    Wt 77.1 kg    SpO2 96%    BMI 24.39 kg/m  Physical Exam Vitals and nursing note reviewed.  Constitutional:      General: She is not in acute distress.    Appearance: She  is not ill-appearing.  HENT:     Head: Normocephalic and atraumatic.     Nose: No congestion.  Eyes:     Conjunctiva/sclera: Conjunctivae normal.  Cardiovascular:     Rate and Rhythm: Normal rate and regular rhythm.     Pulses: Normal pulses.     Heart sounds: No murmur heard.   No friction rub. No gallop.  Pulmonary:     Effort: No respiratory distress.     Breath sounds: No wheezing, rhonchi or rales.     Comments: Chest pain was reproducible, she was tender on her sternum as well as her left chest  states this was the same pain that she is feeling. Abdominal:     Palpations: Abdomen is soft.     Tenderness: There is no abdominal tenderness. There is no right CVA tenderness or left CVA tenderness.  Musculoskeletal:     Right lower leg: No edema.     Left lower leg: No edema.  Skin:    General: Skin is warm and dry.  Neurological:     Mental Status: She is alert.  Psychiatric:        Mood and Affect: Mood normal.    ED Results / Procedures / Treatments   Labs (all labs ordered are listed, but only abnormal results are displayed) Labs Reviewed  CBC - Abnormal; Notable for the following components:      Result Value   Hemoglobin 15.2 (*)    All other components within normal limits  D-DIMER, QUANTITATIVE - Abnormal; Notable for the following components:   D-Dimer, Quant 0.51 (*)    All other components within normal limits  BASIC METABOLIC PANEL  HEPATIC FUNCTION PANEL  POC URINE PREG, ED  TROPONIN I (HIGH SENSITIVITY)    EKG EKG Interpretation  Date/Time:  Wednesday May 11 2021 11:37:52 EST Ventricular Rate:  108 PR Interval:  162 QRS Duration: 74 QT Interval:  318 QTC Calculation: 426 R Axis:   18 Text Interpretation: Sinus tachycardia Low voltage QRS Cannot rule out Anterior infarct , age undetermined Abnormal ECG When compared with ECG of 08-Jan-2019 10:30, PREVIOUS ECG IS PRESENT Confirmed by Bethann Berkshire [phone]) on 05/11/2021 12:13:46 PM  Radiology DG Chest 2 View  Result Date: 05/11/2021 CLINICAL DATA:  Chest EXAM: CHEST - 2 VIEW COMPARISON:  Radiograph 01/08/2019 FINDINGS: The cardiomediastinal silhouette is within normal limits. There is no focal airspace disease. There is no large pleural effusion. There is no visible pneumothorax. There is no acute osseous abnormality. IMPRESSION: No evidence of acute cardiopulmonary disease. Electronically Signed   By: Caprice Renshaw M.D.   On: 05/11/2021 12:02    Procedures Procedures    Medications Ordered in  ED Medications  ondansetron (ZOFRAN) injection 4 mg (4 mg Intravenous Given 05/11/21 1230)  alum & mag hydroxide-simeth (MAALOX/MYLANTA) 200-200-20 MG/5ML suspension 30 mL (30 mLs Oral Given 05/11/21 1344)  dicyclomine (BENTYL) capsule 20 mg (20 mg Oral Given 05/11/21 1344)    ED Course/ Medical Decision Making/ A&P                           Medical Decision Making  This patient presents to the ED for concern of chest pain, this involves an extensive number of treatment options, and is a complaint that carries with it a high risk of complications and morbidity.  The differential diagnosis includes CVA, PE    Additional history obtained:  Additional history obtained from electronic medical  record    Co morbidities that complicate the patient evaluation  N/A  Social Determinants of Health:  N/A    Lab Tests:  I Ordered, and personally interpreted labs.  The pertinent results include: CBC unremarkable, BMP unremarkable, hepatic function unremarkable for troponin less than 2 urine pregnancy negative, D-dimer 0.51   Imaging Studies ordered:  I ordered imaging studies including chest x-ray I independently visualized and interpreted imaging which showed unremarkable I agree with the radiologist interpretation   Cardiac Monitoring:  The patient was maintained on a cardiac monitor.  I personally viewed and interpreted the cardiac monitored which showed an underlying rhythm of: EKG sinus tach without signs of ischemia   Medicines ordered and prescription drug management:  I ordered medication including GI cocktail, Bentyl for GERD symptoms I have reviewed the patients home medicines and have made adjustments as needed   Reevaluation:  After the interventions noted above, I reevaluated the patient and found that they have :improved  Patient is reassessed after GI cocktail Bentyl she states she is feeling much better has no complaints.   Patient is agreed for discharge.   Bladder   Rule out I have low suspicion for ACS as history is atypical, patient has no cardiac history, EKG was sinus rhythm without signs of ischemia, for troponin is less than 2, will defer second opponent as patient has consistent pain for greater than 12 hours if ACS was present would expect elevation.  Low suspicion for PE as D-dimer is negative, it was noted to be slightly elevated but using medcal age-adjusted d dimer calculator she is underneath the threshold making PE unlikely at this time.  Low suspicion for AAA or aortic dissection as history is atypical, patient has low risk factors.  Low suspicion for systemic infection as patient is nontoxic-appearing, vital signs reassuring, no obvious source infection noted on exam.    Dispostion and problem list  After consideration of the diagnostic results and the patients response to treatment, I feel that the patent would benefit from   Chest pain improved-unclear etiology but likely  secondary due to acid reflux as she has not been taking her PPI pills and been under a lot of stress.  will have her start taking her PPIs again follow-up with PCP for further evaluation.  Gave strict return precautions..             Final Clinical Impression(s) / ED Diagnoses Final diagnoses:  Atypical chest pain    Rx / DC Orders ED Discharge Orders     None         Marcello Fennel, PA-C 05/11/21 1527    Milton Ferguson, MD 05/14/21 1104

## 2021-05-11 NOTE — ED Triage Notes (Signed)
Pt presents to ED with intermittent chest pain x couple days. Pt states it intensified this morning, started getting dizzy this am. Pt states pain mid chest pressure radiating to left shoulder.

## 2021-05-30 DIAGNOSIS — R519 Headache, unspecified: Secondary | ICD-10-CM | POA: Diagnosis not present

## 2021-05-30 DIAGNOSIS — R42 Dizziness and giddiness: Secondary | ICD-10-CM | POA: Diagnosis not present

## 2021-05-30 DIAGNOSIS — R110 Nausea: Secondary | ICD-10-CM | POA: Diagnosis not present

## 2021-05-30 DIAGNOSIS — G479 Sleep disorder, unspecified: Secondary | ICD-10-CM | POA: Diagnosis not present

## 2021-06-06 ENCOUNTER — Other Ambulatory Visit: Payer: Self-pay | Admitting: Gastroenterology

## 2021-06-06 DIAGNOSIS — E663 Overweight: Secondary | ICD-10-CM | POA: Diagnosis not present

## 2021-06-06 DIAGNOSIS — Z6824 Body mass index (BMI) 24.0-24.9, adult: Secondary | ICD-10-CM | POA: Diagnosis not present

## 2021-06-06 DIAGNOSIS — F411 Generalized anxiety disorder: Secondary | ICD-10-CM | POA: Diagnosis not present

## 2021-06-06 DIAGNOSIS — K219 Gastro-esophageal reflux disease without esophagitis: Secondary | ICD-10-CM

## 2021-06-25 ENCOUNTER — Encounter: Payer: Self-pay | Admitting: Emergency Medicine

## 2021-06-25 ENCOUNTER — Ambulatory Visit
Admission: EM | Admit: 2021-06-25 | Discharge: 2021-06-25 | Disposition: A | Discharge status: Home / Self Care | Payer: BC Managed Care – PPO | Attending: Family Medicine | Admitting: Family Medicine

## 2021-06-25 ENCOUNTER — Ambulatory Visit (INDEPENDENT_AMBULATORY_CARE_PROVIDER_SITE_OTHER): Payer: BC Managed Care – PPO

## 2021-06-25 ENCOUNTER — Other Ambulatory Visit: Payer: Self-pay

## 2021-06-25 DIAGNOSIS — M79645 Pain in left finger(s): Secondary | ICD-10-CM | POA: Diagnosis not present

## 2021-06-25 DIAGNOSIS — S60052A Contusion of left little finger without damage to nail, initial encounter: Secondary | ICD-10-CM | POA: Diagnosis not present

## 2021-06-25 DIAGNOSIS — S67197A Crushing injury of left little finger, initial encounter: Secondary | ICD-10-CM | POA: Diagnosis not present

## 2021-06-25 DIAGNOSIS — S6992XA Unspecified injury of left wrist, hand and finger(s), initial encounter: Secondary | ICD-10-CM | POA: Diagnosis not present

## 2021-06-25 NOTE — ED Provider Notes (Signed)
[facility]    CSN: [id number] Arrival date & time: 06/25/21  7616      History   Chief Complaint Chief Complaint  Patient presents with   Finger Injury   HPI Julie [name] is a 51 y.o. female.   Presenting today with left fifth finger pain, bruising, swelling after a crush injury yesterday where the handle came down on her finger.  She states that she has no numbness, tingling, loss of range of motion but does have significant pain in the PIP, bruising.  Has been using ice, over-the-counter pain relievers with mild temporary relief of symptoms.   Past Medical History:  Diagnosis Date   Anxiety    Arthritis    Right hand ring finger   Bad odor of urine 0/73/7106   Complication of anesthesia    GERD (gastroesophageal reflux disease)    Headache    PONV (postoperative nausea and vomiting)     Patient Active Problem List   Diagnosis Date Noted   Low back pain 02/02/2020   Encounter for screening fecal occult blood testing 02/02/2020   Encounter for well woman exam with routine gynecological exam 01/31/2019   Flatulence 01/14/2019   GERD (gastroesophageal reflux disease) 01/04/2018   Skin tag 10/04/2017   Screening for colorectal cancer 10/04/2017   Well woman exam with routine gynecological exam 10/04/2017   Tired 10/04/2017   Screening for thyroid disorder 10/04/2017   Screening cholesterol level 10/04/2017   Gastritis and gastroduodenitis 10/01/2017   Constipation 10/01/2017   Dyspepsia 06/26/2017   Chest pain 06/26/2017   Screening for genitourinary condition 09/13/2016   Heme positive stool 09/13/2016   Bad odor of urine 09/09/2015   Anxiety 09/17/2013    Past Surgical History:  Procedure Laterality Date   ABDOMINAL HYSTERECTOMY     BIOPSY  08/14/2017   Procedure: BIOPSY;  Surgeon: Danie Binder, MD;  Location: [facility];  Service: Endoscopy;;  duodenum gastric   BLADDER SURGERY     CLOSED REDUCTION FINGER WITH PERCUTANEOUS  PINNING Right    Ring finger   COLONOSCOPY WITH PROPOFOL N/A 01/16/2017   Dr. Oneida Alar: Redundant colon, otherwise normal.  Next anoscopy in 10 years with MAC.   ESOPHAGOGASTRODUODENOSCOPY (EGD) WITH PROPOFOL N/A 08/14/2017   Fields: Gastritis, no H. pylori.  Fundic gland polyps.  Small bowel biopsies negative.    OB History     Gravida  2   Para  2   Term      Preterm      AB      Living  2      SAB      IAB      Ectopic      Multiple      Live Births               Home Medications    Prior to Admission medications   Medication Sig Start Date End Date Taking? Authorizing Provider  busPIRone (BUSPAR) 5 MG tablet TAKE (1) TABLET BY MOUTH (3) TIMES DAILY. Patient taking differently: Take 5 mg by mouth 3 (three) times daily. 02/02/20   Estill Dooms, NP  clonazePAM (KLONOPIN) 0.5 MG tablet Take 0.25 mg by mouth 2 (two) times daily. 01/26/21   [provider]  EMGALITY 120 MG/ML SOAJ Inject 120 mg into the skin every 28 (twenty-eight) days. 01/31/21   [provider]  EPINEPHrine 0.3 mg/0.3 mL IJ SOAJ injection Inject 0.3 mLs into the muscle once as  needed. 08/06/18   [provider]  Multiple Vitamin (MULTIVITAMIN WITH MINERALS) TABS tablet Take 1 tablet by mouth daily.    [provider]  nitrofurantoin, macrocrystal-monohydrate, (MACROBID) 100 MG capsule Take 100 mg by mouth daily. 04/19/21   [provider]  nortriptyline (PAMELOR) 50 MG capsule Take 20 mg by mouth at bedtime. 11/23/20   [provider]  RABEprazole (ACIPHEX) 20 MG tablet TAKE (1) TABLET BY MOUTH ONCE DAILY. 06/06/21   Mahala Menghini, PA-C  rizatriptan (MAXALT) 10 MG tablet Take 10 mg by mouth as needed for migraine. 05/10/21   [provider]  traZODone (DESYREL) 100 MG tablet Take 100 mg by mouth at bedtime. 05/21/18   [provider]    Family History Family History  Adopted: Yes  Problem Relation Age of Onset   Irritable  bowel syndrome Son    Colon cancer Neg Hx    Gastric cancer Neg Hx    Esophageal cancer Neg Hx     Social History Social History   Tobacco Use   Smoking status: Never   Smokeless tobacco: Never  Vaping Use   Vaping Use: Never used  Substance Use Topics   Alcohol use: Yes    Comment: occassionally   Drug use: No     Allergies   Compazine [prochlorperazine], Demerol [meperidine], and Ibuprofen   Review of Systems Review of Systems Per HPI  Physical Exam Triage Vital Signs ED Triage Vitals  Enc Vitals Group     BP 06/25/21 0954 122/75     Pulse Rate 06/25/21 0954 92     Resp 06/25/21 0954 18     Temp 06/25/21 0954 98 F (36.7 C)     Temp Source 06/25/21 0954 Oral     SpO2 06/25/21 0954 98 %     Weight 06/25/21 0955 175 lb (79.4 kg)     Height 06/25/21 0955 _0  (1.778 m)     Head Circumference --      Peak Flow --      Pain Score 06/25/21 0955 5     Pain Loc --      Pain Edu? --      Excl. in [place]? --    No data found.  Updated Vital Signs BP 122/75 (BP Location: Right Arm)    Pulse 92    Temp 98 F (36.7 C) (Oral)    Resp 18    Ht _1  (1.778 m)    Wt 175 lb (79.4 kg)    SpO2 98%    BMI 25.11 kg/m   Visual Acuity Right Eye Distance:   Left Eye Distance:   Bilateral Distance:    Right Eye Near:   Left Eye Near:    Bilateral Near:     Physical Exam Vitals and nursing note reviewed.  Constitutional:      Appearance: Normal appearance. She is not ill-appearing.  HENT:     Head: Atraumatic.  Eyes:     Extraocular Movements: Extraocular movements intact.     Conjunctiva/sclera: Conjunctivae normal.  Cardiovascular:     Rate and Rhythm: Normal rate and regular rhythm.     Heart sounds: Normal heart sounds.  Pulmonary:     Effort: Pulmonary effort is normal.     Breath sounds: Normal breath sounds.  Musculoskeletal:        General: Swelling, tenderness and signs of injury present. No deformity. Normal range of motion.     Cervical back: Normal  range  of motion and neck supple.     Comments: Bruising, swelling, tenderness to palpation at PIP of the left fifth finger.  Range of motion and strength intact  Skin:    General: Skin is warm and dry.     Findings: Bruising present.  Neurological:     Mental Status: She is alert and oriented to person, place, and time.     Comments: Left hand neurovascularly intact  Psychiatric:        Mood and Affect: Mood normal.        Thought Content: Thought content normal.        Judgment: Judgment normal.     UC Treatments / Results  Labs (all labs ordered are listed, but only abnormal results are displayed) Labs Reviewed - No data to display  EKG   Radiology DG Hand Complete Left  Result Date: 06/25/2021 CLINICAL DATA:  Smashing injury to hand.  Little finger pain. EXAM: LEFT HAND - COMPLETE 3+ VIEW COMPARISON:  None. FINDINGS: There is no evidence of fracture or dislocation. There is no evidence of arthropathy or other focal bone abnormality. Soft tissues are unremarkable. IMPRESSION: Negative. Electronically Signed   By: Marlaine Hind M.D.   On: 06/25/2021 10:22    Procedures Procedures (including critical care time)  Medications Ordered in UC Medications - No data to display  Initial Impression / Assessment and Plan / UC Course  I have reviewed the triage vital signs and the nursing notes.  Pertinent labs & imaging results that were available during my care of the patient were reviewed by me and considered in my medical decision making (see chart for details).     X-ray of the left hand negative for acute bony abnormality.  Splint applied for comfort, discussed RICE protocol.  Work note given.  Return for any acutely worsening symptoms.  Final Clinical Impressions(s) / UC Diagnoses   Final diagnoses:  Contusion of left little finger without damage to nail, initial encounter   Discharge Instructions   None    ED Prescriptions   None    PDMP not reviewed this  encounter.   Volney American, [place] 06/25/21 1051

## 2021-06-25 NOTE — ED Triage Notes (Signed)
Pt reports hurt left little finger last night at work when the "bailor" smashed down on it.no obvious deformity noted.

## 2021-08-30 DIAGNOSIS — N3020 Other chronic cystitis without hematuria: Secondary | ICD-10-CM | POA: Diagnosis not present

## 2021-08-30 DIAGNOSIS — E663 Overweight: Secondary | ICD-10-CM | POA: Diagnosis not present

## 2021-08-30 DIAGNOSIS — F411 Generalized anxiety disorder: Secondary | ICD-10-CM | POA: Diagnosis not present

## 2021-08-30 DIAGNOSIS — K219 Gastro-esophageal reflux disease without esophagitis: Secondary | ICD-10-CM | POA: Diagnosis not present

## 2021-08-30 DIAGNOSIS — T148XXA Other injury of unspecified body region, initial encounter: Secondary | ICD-10-CM | POA: Diagnosis not present

## 2021-08-30 DIAGNOSIS — E538 Deficiency of other specified B group vitamins: Secondary | ICD-10-CM | POA: Diagnosis not present

## 2021-08-30 DIAGNOSIS — R5383 Other fatigue: Secondary | ICD-10-CM | POA: Diagnosis not present

## 2021-08-30 DIAGNOSIS — F32A Depression, unspecified: Secondary | ICD-10-CM | POA: Diagnosis not present

## 2021-08-30 DIAGNOSIS — Z6826 Body mass index (BMI) 26.0-26.9, adult: Secondary | ICD-10-CM | POA: Diagnosis not present

## 2021-08-30 DIAGNOSIS — E559 Vitamin D deficiency, unspecified: Secondary | ICD-10-CM | POA: Diagnosis not present

## 2021-08-31 LAB — TSH: TSH: 8.22 — AB (ref 0.41–5.90)

## 2021-08-31 LAB — VITAMIN D 25 HYDROXY (VIT D DEFICIENCY, FRACTURES): Vit D, 25-Hydroxy: 83.6

## 2021-09-05 ENCOUNTER — Telehealth: Payer: Self-pay | Admitting: Adult Health

## 2021-09-05 NOTE — Telephone Encounter (Signed)
Spoke with patient. She says that her thyroid level was low and that her pcp put her on some medicine. She would like to come pick up a copy of her labs from [place] tomorrow. Labs printed and left up front for patient.  ?

## 2021-09-05 NOTE — Telephone Encounter (Signed)
Pt states that she went to her PCP and they states that she is having issues with her thyroids, and she needs to speak with Victorino Dike or her nurse. Please contact pt ?

## 2021-09-06 ENCOUNTER — Other Ambulatory Visit: Payer: Self-pay | Admitting: Gastroenterology

## 2021-09-06 DIAGNOSIS — K219 Gastro-esophageal reflux disease without esophagitis: Secondary | ICD-10-CM

## 2021-09-08 DIAGNOSIS — Z6826 Body mass index (BMI) 26.0-26.9, adult: Secondary | ICD-10-CM | POA: Diagnosis not present

## 2021-09-08 DIAGNOSIS — E663 Overweight: Secondary | ICD-10-CM | POA: Diagnosis not present

## 2021-09-08 DIAGNOSIS — E039 Hypothyroidism, unspecified: Secondary | ICD-10-CM | POA: Diagnosis not present

## 2021-09-29 ENCOUNTER — Encounter: Payer: Self-pay | Admitting: Nurse Practitioner

## 2021-09-29 ENCOUNTER — Ambulatory Visit (INDEPENDENT_AMBULATORY_CARE_PROVIDER_SITE_OTHER): Payer: BC Managed Care – PPO | Admitting: Nurse Practitioner

## 2021-09-29 VITALS — BP 138/87 | HR 80 | Ht 70.0 in | Wt 181.0 lb

## 2021-09-29 DIAGNOSIS — E039 Hypothyroidism, unspecified: Secondary | ICD-10-CM

## 2021-09-29 NOTE — Progress Notes (Signed)
Endocrinology Consult Note                                         09/29/2021, 4:27 PM  Subjective:   Subjective    Julie [name] is a 51 y.o.-year-old female patient being seen in consultation for hypothyroidism referred by Sharilyn Sites, MD.   Past Medical History:  Diagnosis Date   Anxiety    Arthritis    Right hand ring finger   Bad odor of urine 08/07/8117   Complication of anesthesia    GERD (gastroesophageal reflux disease)    Headache    PONV (postoperative nausea and vomiting)     Past Surgical History:  Procedure Laterality Date   ABDOMINAL HYSTERECTOMY     BIOPSY  08/14/2017   Procedure: BIOPSY;  Surgeon: Danie Binder, MD;  Location: [facility];  Service: Endoscopy;;  duodenum gastric   BLADDER SURGERY     CLOSED REDUCTION FINGER WITH PERCUTANEOUS PINNING Right    Ring finger   COLONOSCOPY WITH PROPOFOL N/A 01/16/2017   Dr. Oneida Alar: Redundant colon, otherwise normal.  Next anoscopy in 10 years with MAC.   ESOPHAGOGASTRODUODENOSCOPY (EGD) WITH PROPOFOL N/A 08/14/2017   Fields: Gastritis, no H. pylori.  Fundic gland polyps.  Small bowel biopsies negative.    Social History   Socioeconomic History   Marital status: Divorced    Spouse name: Not on file   Number of children: Not on file   Years of education: Not on file   Highest education level: Not on file  Occupational History   Not on file  Tobacco Use   Smoking status: Never   Smokeless tobacco: Never  Vaping Use   Vaping Use: Never used  Substance and Sexual Activity   Alcohol use: Yes    Comment: occassionally   Drug use: No   Sexual activity: Yes    Birth control/protection: Surgical    Comment: hyst  Other Topics Concern   Not on file  Social History Narrative   Not on file   Social Determinants of Health   Financial Resource Strain: Low Risk    Difficulty of Paying Living Expenses: Not hard at all  Food  Insecurity: No Food Insecurity   Worried About Charity fundraiser in the Last Year: Never true   [place] in the Last Year: Never true  Transportation Needs: No Transportation Needs   Lack of Transportation (Medical): No   Lack of Transportation (Non-Medical): No  Physical Activity: Insufficiently Active   Days of Exercise per Week: 1 day   Minutes of Exercise per Session: 60 min  Stress: No Stress Concern Present   Feeling of Stress : Only a little  Social Connections: Moderately Isolated   Frequency of Communication with Friends and Family: More than three times a week   Frequency of Social Gatherings with Friends and Family: More than three times a week   Attends Religious Services: Never   Marine scientist or Organizations:  Yes   Attends Archivist Meetings: More than 4 times per year   Marital Status: Divorced    Family History  Adopted: Yes  Problem Relation Age of Onset   Irritable bowel syndrome Son    Colon cancer Neg Hx    Gastric cancer Neg Hx    Esophageal cancer Neg Hx     Outpatient Encounter Medications as of 09/29/2021  Medication Sig   busPIRone (BUSPAR) 5 MG tablet TAKE (1) TABLET BY MOUTH (3) TIMES DAILY. (Patient taking differently: Take 5 mg by mouth 3 (three) times daily.)   EMGALITY 120 MG/ML SOAJ Inject 120 mg into the skin every 28 (twenty-eight) days.   EPINEPHrine 0.3 mg/0.3 mL IJ SOAJ injection Inject 0.3 mLs into the muscle once as needed.   levothyroxine (SYNTHROID) 50 MCG tablet Take 50 mcg by mouth daily.   Multiple Vitamin (MULTIVITAMIN WITH MINERALS) TABS tablet Take 1 tablet by mouth daily.   nitrofurantoin, macrocrystal-monohydrate, (MACROBID) 100 MG capsule Take 100 mg by mouth daily.   nortriptyline (PAMELOR) 50 MG capsule Take 20 mg by mouth at bedtime.   rizatriptan (MAXALT) 10 MG tablet Take 10 mg by mouth as needed for migraine.   traZODone (DESYREL) 100 MG tablet Take 100 mg by mouth at bedtime.   [DISCONTINUED]  clonazePAM (KLONOPIN) 0.5 MG tablet Take 0.25 mg by mouth 2 (two) times daily.   [DISCONTINUED] RABEprazole (ACIPHEX) 20 MG tablet TAKE (1) TABLET BY MOUTH ONCE DAILY.   No facility-administered encounter medications on file as of 09/29/2021.    ALLERGIES: Allergies  Allergen Reactions   Compazine [Prochlorperazine] Hives   Demerol [Meperidine] Nausea And Vomiting   Ibuprofen Other (See Comments)    Stomach issues   VACCINATION STATUS: Immunization History  Administered Date(s) Administered   Tdap 10/05/2013, 08/03/2014     HPI   Julie [name]  is a patient with the above medical history. she was recently diagnosed with hypothyroidism at approximate age of 49 years, which required subsequent initiation of thyroid hormone replacement. She is currently on Levothyroxine 50 micrograms. she reports compliance to this medication:  Taking it daily on empty stomach with water, separated by >30 minutes before breakfast and other medications , and by at least 4 hours from calcium, iron, PPIs, multivitamins .  I reviewed patient's thyroid tests:  Lab Results  Component Value Date   TSH 8.22 (A) 08/31/2021   TSH 2.920 02/10/2021   TSH 2.150 02/02/2020   TSH 1.590 01/31/2019   TSH 1.580 10/08/2017   TSH 1.570 09/13/2016   TSH 1.210 09/09/2015   TSH 1.450 09/25/2014   TSH 1.873 09/17/2013     Pt describes: - weight gain - fatigue - depression/anxiety - headaches  Pt denies feeling nodules in neck, hoarseness, dysphagia/odynophagia, SOB with lying down.  she was adopted so she does not know her biological family's medical history.  No history of radiation therapy to head or neck.  No recent use of iodine supplements.  She does take a Biotin supplement and multivitamin.  I reviewed her chart and she also has a history of GERD.   ROS:  Constitutional: + weight gain, + fatigue, no subjective hyperthermia, no subjective hypothermia, + headaches-worse since starting  Levothyroxine Eyes: no blurry vision, no xerophthalmia ENT: no sore throat, no nodules palpated in throat, no dysphagia/odynophagia, no hoarseness Cardiovascular: no chest pain, no SOB, no palpitations, no leg swelling Respiratory: no cough, no SOB Gastrointestinal: no nausea/vomiting/diarrhea Musculoskeletal: + muscle/joint aches Skin: no rashes  Neurological: + tremors, no numbness, no tingling, no dizziness Psychiatric: + depression, + anxiety   Objective:   Objective     BP 138/87   Pulse 80   Ht _0  (1.778 m)   Wt 181 lb (82.1 kg)   BMI 25.97 kg/m  Wt Readings from Last 3 Encounters:  09/29/21 181 lb (82.1 kg)  06/25/21 175 lb (79.4 kg)  05/11/21 170 lb (77.1 kg)    BP Readings from Last 3 Encounters:  09/29/21 138/87  06/25/21 122/75  05/11/21 120/84      Physical Exam- Limited  Constitutional:  Body mass index is 25.97 kg/m. , not in acute distress, normal state of mind Eyes:  EOMI, no exophthalmos Neck: Supple Thyroid: mild fullness to neck, no palpable nodularity Cardiovascular: RRR, no murmurs, rubs, or gallops, no edema Respiratory: Adequate breathing efforts, no crackles, rales, rhonchi, or wheezing Musculoskeletal: no gross deformities, strength intact in all four extremities, no gross restriction of joint movements Skin:  no rashes, no hyperemia Neurological: slight tremor with outstretched hands bilaterally   CMP ( most recent) CMP     Component Value Date/Time   NA 138 05/11/2021 1154   NA 139 02/10/2021 0937   K 3.8 05/11/2021 1154   CL 103 05/11/2021 1154   CO2 24 05/11/2021 1154   GLUCOSE 96 05/11/2021 1154   BUN 15 05/11/2021 1154   BUN 10 02/10/2021 0937   CREATININE 0.92 05/11/2021 1154   CREATININE 0.65 09/17/2013 0958   CALCIUM 9.9 05/11/2021 1154   PROT 7.5 05/11/2021 1154   PROT 6.5 02/10/2021 0937   ALBUMIN 4.4 05/11/2021 1154   ALBUMIN 4.3 02/10/2021 0937   AST 29 05/11/2021 1154   ALT 35 05/11/2021 1154   ALKPHOS 86  05/11/2021 1154   BILITOT 0.5 05/11/2021 1154   BILITOT 0.4 02/10/2021 0937   GFRNONAA >60 05/11/2021 1154   GFRAA 99 02/02/2020 1145     Diabetic Labs (most recent): Lab Results  Component Value Date   HGBA1C 5.9 (H) 09/09/2015     Lipid Panel ( most recent) Lipid Panel     Component Value Date/Time   CHOL 184 02/10/2021 0937   TRIG 125 02/10/2021 0937   HDL 48 02/10/2021 0937   CHOLHDL 3.8 02/10/2021 0937   CHOLHDL 4.1 09/17/2013 0958   VLDL 25 09/17/2013 0958   LDLCALC 114 (H) 02/10/2021 0937   LABVLDL 22 02/10/2021 0937       Lab Results  Component Value Date   TSH 8.22 (A) 08/31/2021   TSH 2.920 02/10/2021   TSH 2.150 02/02/2020   TSH 1.590 01/31/2019   TSH 1.580 10/08/2017   TSH 1.570 09/13/2016   TSH 1.210 09/09/2015   TSH 1.450 09/25/2014   TSH 1.873 09/17/2013      Assessment & Plan:   ASSESSMENT / PLAN:  1. Hypothyroidism-unspecified   Patient with new diagnosis of hypothyroidism, on levothyroxine therapy (started on 5/5). On physical exam, patient does not have gross goiter, thyroid nodules, or neck compression symptoms.  -She is advised to continue Levothyroxine 50 mcg po daily before breakfast for now.  Will repeat thyroid function tests after being off Biotin for 5 days to get most accurate result.  - We discussed about correct intake of levothyroxine, at fasting, with water, separated by at least 30 minutes from breakfast, and separated by more than 4 hours from calcium, iron, multivitamins, acid reflux medications (PPIs). -Patient is made aware of the fact that thyroid hormone replacement is needed  for life, dose to be adjusted by periodic monitoring of thyroid function tests.  - Will check thyroid tests before next visit: TSH, free T4 and will add antibody testing to help classify her dysfunction.  -Due to absence of clinical goiter, no need for thyroid ultrasound.    - Time spent with the patient: 45 minutes, of which >50% was spent in  obtaining information about her symptoms, reviewing her previous labs, evaluations, and treatments, counseling her about her hypothyroidism, and developing a plan to confirm the diagnosis and long term treatment as necessary. Please refer to "Patient Self Inventory" in the Media tab for reviewed elements of pertinent patient history.  Erik Obey Pierron participated in the discussions, expressed understanding, and voiced agreement with the above plans.  All questions were answered to her satisfaction. she is encouraged to contact clinic should she have any questions or concerns prior to her return visit.   FOLLOW UP PLAN:  Return in about 2 weeks (around 10/13/2021) for Thyroid follow up, Previsit labs.  Rayetta Pigg, [facility] [address] [place], [place] [address] Phone: [phone] Fax: [phone]  09/29/2021, 4:27 PM

## 2021-09-29 NOTE — Patient Instructions (Signed)

## 2021-10-04 DIAGNOSIS — E039 Hypothyroidism, unspecified: Secondary | ICD-10-CM | POA: Diagnosis not present

## 2021-10-06 LAB — THYROID PEROXIDASE ANTIBODY: Thyroperoxidase Ab SerPl-aCnc: <9 IU/mL (ref 0–34)

## 2021-10-06 LAB — T3, FREE: T3, Free: 3.2 pg/mL (ref 2.0–4.4)

## 2021-10-06 LAB — THYROGLOBULIN ANTIBODY: Thyroglobulin Antibody: 2.4 IU/mL — ABNORMAL HIGH (ref 0.0–0.9)

## 2021-10-06 LAB — TSH: TSH: 2.42 u[IU]/mL (ref 0.450–4.500)

## 2021-10-06 LAB — T4, FREE: Free T4: 0.99 ng/dL (ref 0.82–1.77)

## 2021-10-17 ENCOUNTER — Encounter: Payer: Self-pay | Admitting: Nurse Practitioner

## 2021-10-17 ENCOUNTER — Ambulatory Visit (INDEPENDENT_AMBULATORY_CARE_PROVIDER_SITE_OTHER): Payer: BC Managed Care – PPO | Admitting: Nurse Practitioner

## 2021-10-17 VITALS — BP 137/91 | Wt 181.4 lb

## 2021-10-17 DIAGNOSIS — E038 Other specified hypothyroidism: Secondary | ICD-10-CM

## 2021-10-17 DIAGNOSIS — E063 Autoimmune thyroiditis: Secondary | ICD-10-CM | POA: Diagnosis not present

## 2021-10-17 MED ORDER — LEVOTHYROXINE SODIUM 50 MCG PO TABS: 75.0000 ug | ORAL_TABLET | Freq: Every day | ORAL | 2 refills | Status: DC

## 2021-10-17 NOTE — Progress Notes (Signed)
Endocrinology Follow Up Note                                         10/17/2021, 2:02 PM  Subjective:   Subjective    Julie [name] is a 51 y.o.-year-old female patient being seen in follow up after being seen in consultation for hypothyroidism referred by Sharilyn Sites, MD.   Past Medical History:  Diagnosis Date   Anxiety    Arthritis    Right hand ring finger   Bad odor of urine 7/00/1749   Complication of anesthesia    GERD (gastroesophageal reflux disease)    Headache    PONV (postoperative nausea and vomiting)     Past Surgical History:  Procedure Laterality Date   ABDOMINAL HYSTERECTOMY     BIOPSY  08/14/2017   Procedure: BIOPSY;  Surgeon: Danie Binder, MD;  Location: [facility];  Service: Endoscopy;;  duodenum gastric   BLADDER SURGERY     CLOSED REDUCTION FINGER WITH PERCUTANEOUS PINNING Right    Ring finger   COLONOSCOPY WITH PROPOFOL N/A 01/16/2017   Dr. Oneida Alar: Redundant colon, otherwise normal.  Next anoscopy in 10 years with MAC.   ESOPHAGOGASTRODUODENOSCOPY (EGD) WITH PROPOFOL N/A 08/14/2017   Fields: Gastritis, no H. pylori.  Fundic gland polyps.  Small bowel biopsies negative.    Social History   Socioeconomic History   Marital status: Divorced    Spouse name: Not on file   Number of children: Not on file   Years of education: Not on file   Highest education level: Not on file  Occupational History   Not on file  Tobacco Use   Smoking status: Never   Smokeless tobacco: Never  Vaping Use   Vaping Use: Never used  Substance and Sexual Activity   Alcohol use: Yes    Comment: occassionally   Drug use: No   Sexual activity: Yes    Birth control/protection: Surgical    Comment: hyst  Other Topics Concern   Not on file  Social History Narrative   Not on file   Social Determinants of Health   Financial Resource Strain: Low Risk  (02/07/2021)   Overall Financial  Resource Strain (CARDIA)    Difficulty of Paying Living Expenses: Not hard at all  Food Insecurity: No Food Insecurity (02/07/2021)   Hunger Vital Sign    Worried About Running Out of Food in the Last Year: Never true    [place] in the Last Year: Never true  Transportation Needs: No Transportation Needs (02/07/2021)   PRAPARE - Hydrologist (Medical): No    Lack of Transportation (Non-Medical): No  Physical Activity: Insufficiently Active (02/07/2021)   Exercise Vital Sign    Days of Exercise per Week: 1 day    Minutes of Exercise per Session: 60 min  Stress: No Stress Concern Present (02/07/2021)   [place]    Feeling of Stress :  Only a little  Social Connections: Moderately Isolated (02/07/2021)   Social Connection and Isolation Panel [NHANES]    Frequency of Communication with Friends and Family: More than three times a week    Frequency of Social Gatherings with Friends and Family: More than three times a week    Attends Religious Services: Never    Marine scientist or Organizations: Yes    Attends Music therapist: More than 4 times per year    Marital Status: Divorced    Family History  Adopted: Yes  Problem Relation Age of Onset   Irritable bowel syndrome Son    Colon cancer Neg Hx    Gastric cancer Neg Hx    Esophageal cancer Neg Hx     Outpatient Encounter Medications as of 10/17/2021  Medication Sig   busPIRone (BUSPAR) 5 MG tablet TAKE (1) TABLET BY MOUTH (3) TIMES DAILY. (Patient taking differently: Take 5 mg by mouth 3 (three) times daily.)   EMGALITY 120 MG/ML SOAJ Inject 120 mg into the skin every 28 (twenty-eight) days.   EPINEPHrine 0.3 mg/0.3 mL IJ SOAJ injection Inject 0.3 mLs into the muscle once as needed.   levothyroxine (SYNTHROID) 50 MCG tablet Take 1.5 tablets (75 mcg total) by mouth daily before breakfast.   Multiple Vitamin  (MULTIVITAMIN WITH MINERALS) TABS tablet Take 1 tablet by mouth daily.   nitrofurantoin, macrocrystal-monohydrate, (MACROBID) 100 MG capsule Take 100 mg by mouth daily.   nortriptyline (PAMELOR) 50 MG capsule Take 20 mg by mouth at bedtime.   rizatriptan (MAXALT) 10 MG tablet Take 10 mg by mouth as needed for migraine.   traZODone (DESYREL) 100 MG tablet Take 100 mg by mouth at bedtime.   [DISCONTINUED] levothyroxine (SYNTHROID) 50 MCG tablet Take 75 mcg by mouth daily.   No facility-administered encounter medications on file as of 10/17/2021.    ALLERGIES: Allergies  Allergen Reactions   Compazine [Prochlorperazine] Hives   Demerol [Meperidine] Nausea And Vomiting   Ibuprofen Other (See Comments)    Stomach issues   VACCINATION STATUS: Immunization History  Administered Date(s) Administered   Tdap 10/05/2013, 08/03/2014     HPI   Julie [name]  is a patient with the above medical history. she was recently diagnosed with hypothyroidism at approximate age of 21 years, which required subsequent initiation of thyroid hormone replacement. She is currently on Levothyroxine 50 micrograms. she reports compliance to this medication:  Taking it daily on empty stomach with water, separated by >30 minutes before breakfast and other medications , and by at least 4 hours from calcium, iron, PPIs, multivitamins .  I reviewed patient's thyroid tests:  Lab Results  Component Value Date   TSH 2.420 10/04/2021   TSH 8.22 (A) 08/31/2021   TSH 2.920 02/10/2021   TSH 2.150 02/02/2020   TSH 1.590 01/31/2019   TSH 1.580 10/08/2017   TSH 1.570 09/13/2016   TSH 1.210 09/09/2015   TSH 1.450 09/25/2014   TSH 1.873 09/17/2013   FREET4 0.99 10/04/2021     Pt describes: - weight gain - fatigue - depression/anxiety - headaches  Pt denies feeling nodules in neck, hoarseness, dysphagia/odynophagia, SOB with lying down.  she was adopted so she does not know her biological family's medical  history.  No history of radiation therapy to head or neck.  No recent use of iodine supplements.  She does take a Biotin supplement and multivitamin.  I reviewed her chart and she also has a history of GERD.  ROS:  Constitutional: + weight gain, + fatigue, no subjective hyperthermia, no subjective hypothermia, + headaches-worse since starting Levothyroxine Eyes: no blurry vision, no xerophthalmia ENT: no sore throat, no nodules palpated in throat, no dysphagia/odynophagia, no hoarseness Cardiovascular: no chest pain, no SOB, no palpitations, no leg swelling Respiratory: no cough, no SOB Gastrointestinal: no nausea/vomiting/diarrhea Musculoskeletal: + muscle/joint aches Skin: no rashes Neurological: + tremors, no numbness, no tingling, no dizziness Psychiatric: + depression, + anxiety   Objective:   Objective     BP (!) 137/91 (BP Location: Left Arm)   Wt 181 lb 6.4 oz (82.3 kg)   BMI 26.03 kg/m  Wt Readings from Last 3 Encounters:  10/17/21 181 lb 6.4 oz (82.3 kg)  09/29/21 181 lb (82.1 kg)  06/25/21 175 lb (79.4 kg)    BP Readings from Last 3 Encounters:  10/17/21 (!) 137/91  09/29/21 138/87  06/25/21 122/75       Physical Exam- Limited  Constitutional:  Body mass index is 26.03 kg/m. , not in acute distress, normal state of mind Eyes:  EOMI, no exophthalmos Neck: Supple Cardiovascular: RRR, no murmurs, rubs, or gallops, no edema Respiratory: Adequate breathing efforts, no crackles, rales, rhonchi, or wheezing Musculoskeletal: no gross deformities, strength intact in all four extremities, no gross restriction of joint movements Skin:  no rashes, no hyperemia Neurological: no tremor with outstretched hands   CMP ( most recent) CMP     Component Value Date/Time   NA 138 05/11/2021 1154   NA 139 02/10/2021 0937   K 3.8 05/11/2021 1154   CL 103 05/11/2021 1154   CO2 24 05/11/2021 1154   GLUCOSE 96 05/11/2021 1154   BUN 15 05/11/2021 1154   BUN 10  02/10/2021 0937   CREATININE 0.92 05/11/2021 1154   CREATININE 0.65 09/17/2013 0958   CALCIUM 9.9 05/11/2021 1154   PROT 7.5 05/11/2021 1154   PROT 6.5 02/10/2021 0937   ALBUMIN 4.4 05/11/2021 1154   ALBUMIN 4.3 02/10/2021 0937   AST 29 05/11/2021 1154   ALT 35 05/11/2021 1154   ALKPHOS 86 05/11/2021 1154   BILITOT 0.5 05/11/2021 1154   BILITOT 0.4 02/10/2021 0937   GFRNONAA >60 05/11/2021 1154   GFRAA 99 02/02/2020 1145     Diabetic Labs (most recent): Lab Results  Component Value Date   HGBA1C 5.9 (H) 09/09/2015     Lipid Panel ( most recent) Lipid Panel     Component Value Date/Time   CHOL 184 02/10/2021 0937   TRIG 125 02/10/2021 0937   HDL 48 02/10/2021 0937   CHOLHDL 3.8 02/10/2021 0937   CHOLHDL 4.1 09/17/2013 0958   VLDL 25 09/17/2013 0958   LDLCALC 114 (H) 02/10/2021 0937   LABVLDL 22 02/10/2021 0937       Lab Results  Component Value Date   TSH 2.420 10/04/2021   TSH 8.22 (A) 08/31/2021   TSH 2.920 02/10/2021   TSH 2.150 02/02/2020   TSH 1.590 01/31/2019   TSH 1.580 10/08/2017   TSH 1.570 09/13/2016   TSH 1.210 09/09/2015   TSH 1.450 09/25/2014   TSH 1.873 09/17/2013   FREET4 0.99 10/04/2021     Latest Reference Range & Units 01/31/19 09:11 02/02/20 11:45 02/10/21 09:37 08/31/21 00:00 10/04/21 14:53  TSH 0.450 - 4.500 uIU/mL 1.590 2.150 2.920 8.22 ! (E) 2.420  Triiodothyronine,Free,Serum 2.0 - 4.4 pg/mL     3.2  T4,Free(Direct) 0.82 - 1.77 ng/dL     0.99  Thyroperoxidase Ab SerPl-aCnc 0 - 34 IU/mL     <  9  Thyroglobulin Antibody 0.0 - 0.9 IU/mL     2.4 (H)  !: Data is abnormal (H): Data is abnormally high (E): External lab result  Assessment & Plan:   ASSESSMENT / PLAN:  1. Hypothyroidism- d/t Hashimotos thyroiditis   Patient with new diagnosis of hypothyroidism, on levothyroxine therapy (started on 5/5). On physical exam, patient does not have gross goiter, thyroid nodules, or neck compression symptoms.  -Her previsit thyroid function  tests are consistent with under-replacement.  She is advised to increase her Levothyroxine to 75 mcg po daily before breakfast.   - We discussed about correct intake of levothyroxine, at fasting, with water, separated by at least 30 minutes from breakfast, and separated by more than 4 hours from calcium, iron, multivitamins, acid reflux medications (PPIs). -Patient is made aware of the fact that thyroid hormone replacement is needed for life, dose to be adjusted by periodic monitoring of thyroid function tests.    -Due to absence of clinical goiter, no need for thyroid ultrasound.    I spent 20 minutes in the care of the patient today including review of labs from Thyroid Function, CMP, and other relevant labs ; imaging/biopsy records (current and previous including abstractions from other facilities); face-to-face time discussing  her lab results and symptoms, medications doses, her options of short and long term treatment based on the latest standards of care / guidelines;   and documenting the encounter.  Erik Obey Ask  participated in the discussions, expressed understanding, and voiced agreement with the above plans.  All questions were answered to her satisfaction. she is encouraged to contact clinic should she have any questions or concerns prior to her return visit.   FOLLOW UP PLAN:  Return in about 2 months (around 12/17/2021) for Thyroid follow up, Previsit labs.  Rayetta Pigg, [facility] [address] [place], [place] [address] Phone: [phone] Fax: [phone]  10/17/2021, 2:02 PM

## 2021-10-17 NOTE — Patient Instructions (Signed)

## 2021-11-08 DIAGNOSIS — R42 Dizziness and giddiness: Secondary | ICD-10-CM | POA: Diagnosis not present

## 2021-11-08 DIAGNOSIS — R519 Headache, unspecified: Secondary | ICD-10-CM | POA: Diagnosis not present

## 2021-11-08 DIAGNOSIS — R110 Nausea: Secondary | ICD-10-CM | POA: Diagnosis not present

## 2021-11-08 DIAGNOSIS — G479 Sleep disorder, unspecified: Secondary | ICD-10-CM | POA: Diagnosis not present

## 2021-11-22 ENCOUNTER — Telehealth: Payer: Self-pay | Admitting: Nurse Practitioner

## 2021-11-22 NOTE — Telephone Encounter (Signed)
Pt is asking for a nurse to call her about in regards to some concerns that she is having.

## 2021-11-22 NOTE — Telephone Encounter (Signed)
Left a message requesting pt return call to the office. ?

## 2021-11-23 DIAGNOSIS — E063 Autoimmune thyroiditis: Secondary | ICD-10-CM | POA: Diagnosis not present

## 2021-11-23 DIAGNOSIS — E038 Other specified hypothyroidism: Secondary | ICD-10-CM | POA: Diagnosis not present

## 2021-11-23 NOTE — Telephone Encounter (Signed)
We can have her go ahead and do her labs now and see if her thyroid might be acting up.  Sounds like she may be over-replaced.

## 2021-11-23 NOTE — Telephone Encounter (Signed)
Discussed with pt, understanding voiced. 

## 2021-11-23 NOTE — Telephone Encounter (Signed)
Pt states for the past week she's experiencing increased cold sweats, hot flashes and increased anxiety. States at her last visit in June her dose of levothyroxine was changed to , she has an upcoming appointment for August 21st. She also stated she recently was started on seroquel 25mg  qhs approximately a week ago to help with sleep.

## 2021-11-24 LAB — T4, FREE: Free T4: 1.33 ng/dL (ref 0.82–1.77)

## 2021-11-24 LAB — TSH: TSH: 0.754 u[IU]/mL (ref 0.450–4.500)

## 2021-11-28 ENCOUNTER — Telehealth: Payer: Self-pay | Admitting: Nurse Practitioner

## 2021-11-28 ENCOUNTER — Other Ambulatory Visit: Payer: Self-pay | Admitting: Nurse Practitioner

## 2021-11-28 DIAGNOSIS — E038 Other specified hypothyroidism: Secondary | ICD-10-CM

## 2021-11-28 NOTE — Telephone Encounter (Signed)
Pt.notified

## 2021-11-28 NOTE — Telephone Encounter (Signed)
Labs look great!  She appears to be on the right dose of thyroid hormone.

## 2021-11-28 NOTE — Telephone Encounter (Signed)
Pts appt isnt until 8/21. She was wanting to know if we could tell her anything about her lab results from 7/26

## 2021-12-12 DIAGNOSIS — E063 Autoimmune thyroiditis: Secondary | ICD-10-CM | POA: Diagnosis not present

## 2021-12-12 DIAGNOSIS — E038 Other specified hypothyroidism: Secondary | ICD-10-CM | POA: Diagnosis not present

## 2021-12-13 LAB — T3, FREE: T3, Free: 3.1 pg/mL (ref 2.0–4.4)

## 2021-12-13 LAB — T4, FREE: Free T4: 1.32 ng/dL (ref 0.82–1.77)

## 2021-12-13 LAB — TSH: TSH: 1.07 u[IU]/mL (ref 0.450–4.500)

## 2021-12-19 ENCOUNTER — Ambulatory Visit (INDEPENDENT_AMBULATORY_CARE_PROVIDER_SITE_OTHER): Payer: BC Managed Care – PPO | Admitting: Nurse Practitioner

## 2021-12-19 ENCOUNTER — Encounter: Payer: Self-pay | Admitting: Nurse Practitioner

## 2021-12-19 VITALS — BP 138/88 | HR 97 | Ht 70.0 in | Wt 180.0 lb

## 2021-12-19 DIAGNOSIS — E063 Autoimmune thyroiditis: Secondary | ICD-10-CM | POA: Diagnosis not present

## 2021-12-19 DIAGNOSIS — E038 Other specified hypothyroidism: Secondary | ICD-10-CM

## 2021-12-19 MED ORDER — LEVOTHYROXINE SODIUM 75 MCG PO TABS: 75.0000 ug | ORAL_TABLET | Freq: Every day | ORAL | 1 refills | Status: DC

## 2021-12-19 NOTE — Progress Notes (Signed)
Endocrinology Follow Up Note                                         12/19/2021, 1:51 PM  Subjective:   Subjective    Julie [name] is a 51 y.o.-year-old female patient being seen in follow up after being seen in consultation for hypothyroidism referred by Sharilyn Sites, MD.   Past Medical History:  Diagnosis Date   Anxiety    Arthritis    Right hand ring finger   Bad odor of urine 5/40/0867   Complication of anesthesia    GERD (gastroesophageal reflux disease)    Headache    PONV (postoperative nausea and vomiting)     Past Surgical History:  Procedure Laterality Date   ABDOMINAL HYSTERECTOMY     BIOPSY  08/14/2017   Procedure: BIOPSY;  Surgeon: Danie Binder, MD;  Location: [facility];  Service: Endoscopy;;  duodenum gastric   BLADDER SURGERY     CLOSED REDUCTION FINGER WITH PERCUTANEOUS PINNING Right    Ring finger   COLONOSCOPY WITH PROPOFOL N/A 01/16/2017   Dr. Oneida Alar: Redundant colon, otherwise normal.  Next anoscopy in 10 years with MAC.   ESOPHAGOGASTRODUODENOSCOPY (EGD) WITH PROPOFOL N/A 08/14/2017   Fields: Gastritis, no H. pylori.  Fundic gland polyps.  Small bowel biopsies negative.    Social History   Socioeconomic History   Marital status: Divorced    Spouse name: Not on file   Number of children: Not on file   Years of education: Not on file   Highest education level: Not on file  Occupational History   Not on file  Tobacco Use   Smoking status: Never   Smokeless tobacco: Never  Vaping Use   Vaping Use: Never used  Substance and Sexual Activity   Alcohol use: Yes    Comment: occassionally   Drug use: No   Sexual activity: Yes    Birth control/protection: Surgical    Comment: hyst  Other Topics Concern   Not on file  Social History Narrative   Not on file   Social Determinants of Health   Financial Resource Strain: Low Risk  (02/07/2021)   Overall Financial  Resource Strain (CARDIA)    Difficulty of Paying Living Expenses: Not hard at all  Food Insecurity: No Food Insecurity (02/07/2021)   Hunger Vital Sign    Worried About Running Out of Food in the Last Year: Never true    [place] in the Last Year: Never true  Transportation Needs: No Transportation Needs (02/07/2021)   PRAPARE - Hydrologist (Medical): No    Lack of Transportation (Non-Medical): No  Physical Activity: Insufficiently Active (02/07/2021)   Exercise Vital Sign    Days of Exercise per Week: 1 day    Minutes of Exercise per Session: 60 min  Stress: No Stress Concern Present (02/07/2021)   [place]    Feeling of Stress :  Only a little  Social Connections: Moderately Isolated (02/07/2021)   Social Connection and Isolation Panel [NHANES]    Frequency of Communication with Friends and Family: More than three times a week    Frequency of Social Gatherings with Friends and Family: More than three times a week    Attends Religious Services: Never    Marine scientist or Organizations: Yes    Attends Music therapist: More than 4 times per year    Marital Status: Divorced    Family History  Adopted: Yes  Problem Relation Age of Onset   Irritable bowel syndrome Son    Colon cancer Neg Hx    Gastric cancer Neg Hx    Esophageal cancer Neg Hx     Outpatient Encounter Medications as of 12/19/2021  Medication Sig   QUEtiapine (SEROQUEL) 25 MG tablet daily.   busPIRone (BUSPAR) 5 MG tablet TAKE (1) TABLET BY MOUTH (3) TIMES DAILY. (Patient taking differently: Take 5 mg by mouth 3 (three) times daily.)   EMGALITY 120 MG/ML SOAJ Inject 120 mg into the skin every 28 (twenty-eight) days.   EPINEPHrine 0.3 mg/0.3 mL IJ SOAJ injection Inject 0.3 mLs into the muscle once as needed.   levothyroxine (SYNTHROID) 75 MCG tablet Take 1 tablet (75 mcg total) by mouth daily  before breakfast.   Multiple Vitamin (MULTIVITAMIN WITH MINERALS) TABS tablet Take 1 tablet by mouth daily.   nitrofurantoin, macrocrystal-monohydrate, (MACROBID) 100 MG capsule Take 100 mg by mouth daily.   nortriptyline (PAMELOR) 50 MG capsule Take 20 mg by mouth at bedtime.   rizatriptan (MAXALT) 10 MG tablet Take 10 mg by mouth as needed for migraine.   [DISCONTINUED] levothyroxine (SYNTHROID) 50 MCG tablet Take 1.5 tablets (75 mcg total) by mouth daily before breakfast.   [DISCONTINUED] traZODone (DESYREL) 100 MG tablet Take 100 mg by mouth at bedtime.   No facility-administered encounter medications on file as of 12/19/2021.    ALLERGIES: Allergies  Allergen Reactions   Compazine [Prochlorperazine] Hives   Demerol [Meperidine] Nausea And Vomiting   Ibuprofen Other (See Comments)    Stomach issues   VACCINATION STATUS: Immunization History  Administered Date(s) Administered   Tdap 10/05/2013, 08/03/2014     HPI   Julie [name]  is a patient with the above medical history. she was recently diagnosed with hypothyroidism at approximate age of 51 years, which required subsequent initiation of thyroid hormone replacement. She is currently on Levothyroxine 75 micrograms. she reports compliance to this medication:  Taking it daily on empty stomach with water, separated by >30 minutes before breakfast and other medications , and by at least 4 hours from calcium, iron, PPIs, multivitamins .  I reviewed patient's thyroid tests:  Lab Results  Component Value Date   TSH 1.070 12/12/2021   TSH 0.754 11/23/2021   TSH 2.420 10/04/2021   TSH 8.22 (A) 08/31/2021   TSH 2.920 02/10/2021   TSH 2.150 02/02/2020   TSH 1.590 01/31/2019   TSH 1.580 10/08/2017   TSH 1.570 09/13/2016   TSH 1.210 09/09/2015   FREET4 1.32 12/12/2021   FREET4 1.33 11/23/2021   FREET4 0.99 10/04/2021     Pt describes: - heat intolerance- improving  Pt denies feeling nodules in neck, hoarseness,  dysphagia/odynophagia, SOB with lying down.  she was adopted so she does not know her biological family's medical history.  No history of radiation therapy to head or neck.  No recent use of iodine supplements.  She does take  a Biotin supplement and multivitamin.  I reviewed her chart and she also has a history of GERD.  Review of systems  Constitutional: + Minimally fluctuating body weight,  current Body mass index is 25.83 kg/m. , + fatigue-improved, intermittent subjective hyperthermia-improving, no subjective hypothermia Eyes: no blurry vision, no xerophthalmia ENT: no sore throat, no nodules palpated in throat, no dysphagia/odynophagia, no hoarseness Cardiovascular: no chest pain, no shortness of breath, no palpitations, no leg swelling Respiratory: no cough, no shortness of breath Gastrointestinal: no nausea/vomiting/diarrhea Musculoskeletal: no muscle/joint aches Skin: no rashes, no hyperemia Neurological: no tremors, no numbness, no tingling, no dizziness Psychiatric: no depression, no anxiety   Objective:   Objective     BP 138/88   Pulse 97   Ht _0  (1.778 m)   Wt 180 lb (81.6 kg)   BMI 25.83 kg/m  Wt Readings from Last 3 Encounters:  12/19/21 180 lb (81.6 kg)  10/17/21 181 lb 6.4 oz (82.3 kg)  09/29/21 181 lb (82.1 kg)    BP Readings from Last 3 Encounters:  12/19/21 138/88  10/17/21 (!) 137/91  09/29/21 138/87     Physical Exam- Limited  Constitutional:  Body mass index is 25.83 kg/m. , not in acute distress, normal state of mind Eyes:  EOMI, no exophthalmos Neck: Supple Cardiovascular: RRR, no murmurs, rubs, or gallops, no edema Respiratory: Adequate breathing efforts, no crackles, rales, rhonchi, or wheezing Musculoskeletal: no gross deformities, strength intact in all four extremities, no gross restriction of joint movements Skin:  no rashes, no hyperemia Neurological: no tremor with outstretched hands   CMP ( most recent) CMP      Component Value Date/Time   NA 138 05/11/2021 1154   NA 139 02/10/2021 0937   K 3.8 05/11/2021 1154   CL 103 05/11/2021 1154   CO2 24 05/11/2021 1154   GLUCOSE 96 05/11/2021 1154   BUN 15 05/11/2021 1154   BUN 10 02/10/2021 0937   CREATININE 0.92 05/11/2021 1154   CREATININE 0.65 09/17/2013 0958   CALCIUM 9.9 05/11/2021 1154   PROT 7.5 05/11/2021 1154   PROT 6.5 02/10/2021 0937   ALBUMIN 4.4 05/11/2021 1154   ALBUMIN 4.3 02/10/2021 0937   AST 29 05/11/2021 1154   ALT 35 05/11/2021 1154   ALKPHOS 86 05/11/2021 1154   BILITOT 0.5 05/11/2021 1154   BILITOT 0.4 02/10/2021 0937   GFRNONAA >60 05/11/2021 1154   GFRAA 99 02/02/2020 1145     Diabetic Labs (most recent): Lab Results  Component Value Date   HGBA1C 5.9 (H) 09/09/2015     Lipid Panel ( most recent) Lipid Panel     Component Value Date/Time   CHOL 184 02/10/2021 0937   TRIG 125 02/10/2021 0937   HDL 48 02/10/2021 0937   CHOLHDL 3.8 02/10/2021 0937   CHOLHDL 4.1 09/17/2013 0958   VLDL 25 09/17/2013 0958   LDLCALC 114 (H) 02/10/2021 0937   LABVLDL 22 02/10/2021 0937       Lab Results  Component Value Date   TSH 1.070 12/12/2021   TSH 0.754 11/23/2021   TSH 2.420 10/04/2021   TSH 8.22 (A) 08/31/2021   TSH 2.920 02/10/2021   TSH 2.150 02/02/2020   TSH 1.590 01/31/2019   TSH 1.580 10/08/2017   TSH 1.570 09/13/2016   TSH 1.210 09/09/2015   FREET4 1.32 12/12/2021   FREET4 1.33 11/23/2021   FREET4 0.99 10/04/2021     Latest Reference Range & Units 02/10/21 09:37 08/31/21 00:00 10/04/21 14:53 11/23/21 16:04  12/12/21 13:40  TSH 0.450 - 4.500 uIU/mL 2.920 8.22 ! (E) 2.420 0.754 1.070  Triiodothyronine,Free,Serum 2.0 - 4.4 pg/mL   3.2  3.1  T4,Free(Direct) 0.82 - 1.77 ng/dL   0.99 1.33 1.32  Thyroperoxidase Ab SerPl-aCnc 0 - 34 IU/mL   <9    Thyroglobulin Antibody 0.0 - 0.9 IU/mL   2.4 (H)    !: Data is abnormal (H): Data is abnormally high (E): External lab result  Assessment & Plan:   ASSESSMENT  / PLAN:  1. Hypothyroidism- d/t Hashimotos thyroiditis   Patient with new diagnosis of hypothyroidism, on levothyroxine therapy (started on 5/5). On physical exam, patient does not have gross goiter, thyroid nodules, or neck compression symptoms.  -Her previsit thyroid function tests are consistent with appropriate hormone replacement.  She is advised to continue her Levothyroxine 75 mcg po daily before breakfast.   - We discussed about correct intake of levothyroxine, at fasting, with water, separated by at least 30 minutes from breakfast, and separated by more than 4 hours from calcium, iron, multivitamins, acid reflux medications (PPIs). -Patient is made aware of the fact that thyroid hormone replacement is needed for life, dose to be adjusted by periodic monitoring of thyroid function tests.    -Due to absence of clinical goiter, no need for thyroid ultrasound.    I spent 30 minutes in the care of the patient today including review of labs from Thyroid Function, CMP, and other relevant labs ; imaging/biopsy records (current and previous including abstractions from other facilities); face-to-face time discussing  her lab results and symptoms, medications doses, her options of short and long term treatment based on the latest standards of care / guidelines;   and documenting the encounter.  Julie [name]  participated in the discussions, expressed understanding, and voiced agreement with the above plans.  All questions were answered to her satisfaction. she is encouraged to contact clinic should she have any questions or concerns prior to her return visit.   FOLLOW UP PLAN:  Return in about 4 months (around 04/20/2022) for Thyroid follow up, Previsit labs.  Rayetta Pigg, [facility] [address] [place], [place] [address] Phone: [phone] Fax: [phone]  12/19/2021, 1:51 PM

## 2021-12-19 NOTE — Patient Instructions (Signed)

## 2022-02-21 DIAGNOSIS — N3946 Mixed incontinence: Secondary | ICD-10-CM | POA: Diagnosis not present

## 2022-02-21 DIAGNOSIS — N3020 Other chronic cystitis without hematuria: Secondary | ICD-10-CM | POA: Diagnosis not present

## 2022-04-03 DIAGNOSIS — F411 Generalized anxiety disorder: Secondary | ICD-10-CM | POA: Diagnosis not present

## 2022-04-03 DIAGNOSIS — R519 Headache, unspecified: Secondary | ICD-10-CM | POA: Diagnosis not present

## 2022-04-03 DIAGNOSIS — G479 Sleep disorder, unspecified: Secondary | ICD-10-CM | POA: Diagnosis not present

## 2022-04-07 DIAGNOSIS — E038 Other specified hypothyroidism: Secondary | ICD-10-CM | POA: Diagnosis not present

## 2022-04-07 DIAGNOSIS — E063 Autoimmune thyroiditis: Secondary | ICD-10-CM | POA: Diagnosis not present

## 2022-04-08 LAB — T4, FREE: Free T4: 1.32 ng/dL (ref 0.82–1.77)

## 2022-04-08 LAB — TSH: TSH: 1.33 u[IU]/mL (ref 0.450–4.500)

## 2022-04-13 NOTE — Patient Instructions (Signed)

## 2022-04-17 ENCOUNTER — Encounter: Payer: Self-pay | Admitting: Nurse Practitioner

## 2022-04-17 ENCOUNTER — Ambulatory Visit (INDEPENDENT_AMBULATORY_CARE_PROVIDER_SITE_OTHER): Payer: BC Managed Care – PPO | Admitting: Nurse Practitioner

## 2022-04-17 VITALS — BP 116/74 | HR 80 | Ht 70.0 in | Wt 184.4 lb

## 2022-04-17 DIAGNOSIS — E038 Other specified hypothyroidism: Secondary | ICD-10-CM

## 2022-04-17 DIAGNOSIS — E063 Autoimmune thyroiditis: Secondary | ICD-10-CM

## 2022-04-17 MED ORDER — LEVOTHYROXINE SODIUM 75 MCG PO TABS
75.0000 ug | ORAL_TABLET | Freq: Every day | ORAL | 1 refills | Status: DC
Start: 2022-04-17 — End: 2022-10-23

## 2022-04-17 NOTE — Progress Notes (Signed)
Endocrinology Follow Up Note                                         04/17/2022, 2:05 PM  Subjective:   Subjective    Julie [name] is a 51 y.o.-year-old female patient being seen in follow up after being seen in consultation for hypothyroidism referred by Julie Sites, MD.   Past Medical History:  Diagnosis Date   Anxiety    Arthritis    Right hand ring finger   Bad odor of urine 4/70/9628   Complication of anesthesia    GERD (gastroesophageal reflux disease)    Headache    PONV (postoperative nausea and vomiting)     Past Surgical History:  Procedure Laterality Date   ABDOMINAL HYSTERECTOMY     BIOPSY  08/14/2017   Procedure: BIOPSY;  Surgeon: Julie Binder, MD;  Location: [facility];  Service: Endoscopy;;  duodenum gastric   BLADDER SURGERY     CLOSED REDUCTION FINGER WITH PERCUTANEOUS PINNING Right    Ring finger   COLONOSCOPY WITH PROPOFOL N/A 01/16/2017   Dr. Oneida [name]: Redundant colon, otherwise normal.  Next anoscopy in 10 years with MAC.   ESOPHAGOGASTRODUODENOSCOPY (EGD) WITH PROPOFOL N/A 08/14/2017   Fields: Gastritis, no H. pylori.  Fundic gland polyps.  Small bowel biopsies negative.    Social History   Socioeconomic History   Marital status: Divorced    Spouse name: Not on file   Number of children: Not on file   Years of education: Not on file   Highest education level: Not on file  Occupational History   Not on file  Tobacco Use   Smoking status: Never   Smokeless tobacco: Never  Vaping Use   Vaping Use: Never used  Substance and Sexual Activity   Alcohol use: Yes    Comment: occassionally   Drug use: No   Sexual activity: Yes    Birth control/protection: Surgical    Comment: hyst  Other Topics Concern   Not on file  Social History Narrative   Not on file   Social Determinants of Health   Financial Resource Strain: Low Risk  (02/07/2021)   Overall Financial  Resource Strain (CARDIA)    Difficulty of Paying Living Expenses: Not hard at all  Food Insecurity: No Food Insecurity (02/07/2021)   Hunger Vital Sign    Worried About Running Out of Food in the Last Year: Never true    [place] in the Last Year: Never true  Transportation Needs: No Transportation Needs (02/07/2021)   PRAPARE - Hydrologist (Medical): No    Lack of Transportation (Non-Medical): No  Physical Activity: Insufficiently Active (02/07/2021)   Exercise Vital Sign    Days of Exercise per Week: 1 day    Minutes of Exercise per Session: 60 min  Stress: No Stress Concern Present (02/07/2021)   [employer]    Feeling of Stress :  Only a little  Social Connections: Moderately Isolated (02/07/2021)   Social Connection and Isolation Panel [NHANES]    Frequency of Communication with Friends and Family: More than three times a week    Frequency of Social Gatherings with Friends and Family: More than three times a week    Attends Religious Services: Never    Marine scientist or Organizations: Yes    Attends Music therapist: More than 4 times per year    Marital Status: Divorced    Family History  Adopted: Yes  Problem Relation Age of Onset   Irritable bowel syndrome Son    Colon cancer Neg Hx    Gastric cancer Neg Hx    Esophageal cancer Neg Hx     Outpatient Encounter Medications as of 04/17/2022  Medication Sig   busPIRone (BUSPAR) 5 MG tablet TAKE (1) TABLET BY MOUTH (3) TIMES DAILY. (Patient taking differently: Take 5 mg by mouth 3 (three) times daily.)   EMGALITY 120 MG/ML SOAJ Inject 120 mg into the skin every 28 (twenty-eight) days.   EPINEPHrine 0.3 mg/0.3 mL IJ SOAJ injection Inject 0.3 mLs into the muscle once as needed.   Multiple Vitamin (MULTIVITAMIN WITH MINERALS) TABS tablet Take 1 tablet by mouth daily.   nitrofurantoin,  macrocrystal-monohydrate, (MACROBID) 100 MG capsule Take 100 mg by mouth daily.   nortriptyline (PAMELOR) 50 MG capsule Take 20 mg by mouth at bedtime.   QUEtiapine (SEROQUEL) 25 MG tablet daily.   rizatriptan (MAXALT) 10 MG tablet Take 10 mg by mouth as needed for migraine.   [DISCONTINUED] levothyroxine (SYNTHROID) 75 MCG tablet Take 1 tablet (75 mcg total) by mouth daily before breakfast.   levothyroxine (SYNTHROID) 75 MCG tablet Take 1 tablet (75 mcg total) by mouth daily before breakfast.   No facility-administered encounter medications on file as of 04/17/2022.    ALLERGIES: Allergies  Allergen Reactions   Compazine [Prochlorperazine] Hives   Demerol [Meperidine] Nausea And Vomiting   Ibuprofen Other (See Comments)    Stomach issues   VACCINATION STATUS: Immunization History  Administered Date(s) Administered   Tdap 10/05/2013, 08/03/2014     HPI   Julie [name]  is a patient with the above medical history. she was recently diagnosed with hypothyroidism at approximate age of 7 years, which required subsequent initiation of thyroid hormone replacement. She is currently on Levothyroxine 75 micrograms. she reports compliance to this medication:  Taking it daily on empty stomach with water, separated by >30 minutes before breakfast and other medications , and by at least 4 hours from calcium, iron, PPIs, multivitamins .  I reviewed patient's thyroid tests:  Lab Results  Component Value Date   TSH 1.330 04/07/2022   TSH 1.070 12/12/2021   TSH 0.754 11/23/2021   TSH 2.420 10/04/2021   TSH 8.22 (A) 08/31/2021   TSH 2.920 02/10/2021   TSH 2.150 02/02/2020   TSH 1.590 01/31/2019   TSH 1.580 10/08/2017   TSH 1.570 09/13/2016   FREET4 1.32 04/07/2022   FREET4 1.32 12/12/2021   FREET4 1.33 11/23/2021   FREET4 0.99 10/04/2021     Pt describes: - heat intolerance- improving  Pt denies feeling nodules in neck, hoarseness, dysphagia/odynophagia, SOB with lying  down.  she was adopted so she does not know her biological family's medical history.  No history of radiation therapy to head or neck.  No recent use of iodine supplements.  She does take a Biotin supplement and multivitamin.  I reviewed her chart  and she also has a history of GERD.  Review of systems  Constitutional: + Minimally fluctuating body weight,  current Body mass index is 26.46 kg/m. , no fatigue, no subjective hyperthermia, no subjective hypothermia, + chronic headaches (seeing neurology) Eyes: no blurry vision, no xerophthalmia ENT: no sore throat, no nodules palpated in throat, no dysphagia/odynophagia, no hoarseness Cardiovascular: no chest pain, no shortness of breath, no palpitations, no leg swelling Respiratory: no cough, no shortness of breath Gastrointestinal: no nausea/vomiting/diarrhea Musculoskeletal: no muscle/joint aches Skin: no rashes, no hyperemia Neurological: no tremors, no numbness, no tingling, no dizziness Psychiatric: no depression, no anxiety   Objective:   Objective     BP 116/74 (BP Location: Left Arm, Patient Position: Sitting, Cuff Size: Large)   Pulse 80   Ht _0  (1.778 m)   Wt 184 lb 6.4 oz (83.6 kg)   BMI 26.46 kg/m  Wt Readings from Last 3 Encounters:  04/17/22 184 lb 6.4 oz (83.6 kg)  12/19/21 180 lb (81.6 kg)  10/17/21 181 lb 6.4 oz (82.3 kg)    BP Readings from Last 3 Encounters:  04/17/22 116/74  12/19/21 138/88  10/17/21 (!) 137/91     Physical Exam- Limited  Constitutional:  Body mass index is 26.46 kg/m. , not in acute distress, normal state of mind Eyes:  EOMI, no exophthalmos Musculoskeletal: no gross deformities, strength intact in all four extremities, no gross restriction of joint movements Skin:  no rashes, no hyperemia Neurological: no tremor with outstretched hands   CMP ( most recent) CMP     Component Value Date/Time   NA 138 05/11/2021 1154   NA 139 02/10/2021 0937   K 3.8 05/11/2021 1154   CL  103 05/11/2021 1154   CO2 24 05/11/2021 1154   GLUCOSE 96 05/11/2021 1154   BUN 15 05/11/2021 1154   BUN 10 02/10/2021 0937   CREATININE 0.92 05/11/2021 1154   CREATININE 0.65 09/17/2013 0958   CALCIUM 9.9 05/11/2021 1154   PROT 7.5 05/11/2021 1154   PROT 6.5 02/10/2021 0937   ALBUMIN 4.4 05/11/2021 1154   ALBUMIN 4.3 02/10/2021 0937   AST 29 05/11/2021 1154   ALT 35 05/11/2021 1154   ALKPHOS 86 05/11/2021 1154   BILITOT 0.5 05/11/2021 1154   BILITOT 0.4 02/10/2021 0937   GFRNONAA >60 05/11/2021 1154   GFRAA 99 02/02/2020 1145     Diabetic Labs (most recent): Lab Results  Component Value Date   HGBA1C 5.9 (H) 09/09/2015     Lipid Panel ( most recent) Lipid Panel     Component Value Date/Time   CHOL 184 02/10/2021 0937   TRIG 125 02/10/2021 0937   HDL 48 02/10/2021 0937   CHOLHDL 3.8 02/10/2021 0937   CHOLHDL 4.1 09/17/2013 0958   VLDL 25 09/17/2013 0958   LDLCALC 114 (H) 02/10/2021 0937   LABVLDL 22 02/10/2021 0937       Lab Results  Component Value Date   TSH 1.330 04/07/2022   TSH 1.070 12/12/2021   TSH 0.754 11/23/2021   TSH 2.420 10/04/2021   TSH 8.22 (A) 08/31/2021   TSH 2.920 02/10/2021   TSH 2.150 02/02/2020   TSH 1.590 01/31/2019   TSH 1.580 10/08/2017   TSH 1.570 09/13/2016   FREET4 1.32 04/07/2022   FREET4 1.32 12/12/2021   FREET4 1.33 11/23/2021   FREET4 0.99 10/04/2021     Latest Reference Range & Units 02/10/21 09:37 08/31/21 00:00 10/04/21 14:53 11/23/21 16:04 12/12/21 13:40 04/07/22 08:35  TSH 0.450 -  4.500 uIU/mL 2.920 8.22 ! (E) 2.420 0.754 1.070 1.330  Triiodothyronine,Free,Serum 2.0 - 4.4 pg/mL   3.2  3.1   T4,Free(Direct) 0.82 - 1.77 ng/dL   0.99 1.33 1.32 1.32  Thyroperoxidase Ab SerPl-aCnc 0 - 34 IU/mL   <9     Thyroglobulin Antibody 0.0 - 0.9 IU/mL   2.4 (H)     !: Data is abnormal (H): Data is abnormally high (E): External lab result  Assessment & Plan:   ASSESSMENT / PLAN:  1. Hypothyroidism- d/t Hashimotos  thyroiditis   Patient with new diagnosis of hypothyroidism, on levothyroxine therapy (started on 5/5). On physical exam, patient does not have gross goiter, thyroid nodules, or neck compression symptoms.  -Her previsit thyroid function tests are consistent with appropriate hormone replacement.  She is advised to continue her Levothyroxine 75 mcg po daily before breakfast.   - We discussed about correct intake of levothyroxine, at fasting, with water, separated by at least 30 minutes from breakfast, and separated by more than 4 hours from calcium, iron, multivitamins, acid reflux medications (PPIs). -Patient is made aware of the fact that thyroid hormone replacement is needed for life, dose to be adjusted by periodic monitoring of thyroid function tests.    -Due to absence of clinical goiter, no need for thyroid ultrasound.    I spent 30 minutes in the care of the patient today including review of labs from Thyroid Function, CMP, and other relevant labs ; imaging/biopsy records (current and previous including abstractions from other facilities); face-to-face time discussing  her lab results and symptoms, medications doses, her options of short and long term treatment based on the latest standards of care / guidelines;   and documenting the encounter.  Erik Obey Dombeck  participated in the discussions, expressed understanding, and voiced agreement with the above plans.  All questions were answered to her satisfaction. she is encouraged to contact clinic should she have any questions or concerns prior to her return visit.   FOLLOW UP PLAN:  Return in about 6 months (around 10/17/2022) for Thyroid follow up, Previsit labs.  Rayetta Pigg, [facility] [address] [place], [place] [address] Phone: [phone] Fax: [phone]  04/17/2022, 2:05 PM

## 2022-04-26 ENCOUNTER — Other Ambulatory Visit: Payer: Self-pay | Admitting: Gastroenterology

## 2022-05-11 ENCOUNTER — Ambulatory Visit: Payer: BC Managed Care – PPO | Admitting: Adult Health

## 2022-05-23 ENCOUNTER — Other Ambulatory Visit: Payer: Self-pay | Admitting: Student

## 2022-05-23 DIAGNOSIS — R519 Headache, unspecified: Secondary | ICD-10-CM

## 2022-05-23 DIAGNOSIS — R42 Dizziness and giddiness: Secondary | ICD-10-CM

## 2022-05-29 ENCOUNTER — Other Ambulatory Visit: Payer: Self-pay | Admitting: Gastroenterology

## 2022-05-30 ENCOUNTER — Other Ambulatory Visit (HOSPITAL_COMMUNITY): Payer: Self-pay | Admitting: Adult Health

## 2022-05-30 DIAGNOSIS — Z1231 Encounter for screening mammogram for malignant neoplasm of breast: Secondary | ICD-10-CM

## 2022-06-01 ENCOUNTER — Ambulatory Visit (HOSPITAL_COMMUNITY)
Admission: RE | Admit: 2022-06-01 | Discharge: 2022-06-01 | Disposition: A | Discharge status: Home / Self Care | Payer: 59 | Source: Ambulatory Visit | Attending: Adult Health | Admitting: Adult Health

## 2022-06-01 DIAGNOSIS — Z1231 Encounter for screening mammogram for malignant neoplasm of breast: Secondary | ICD-10-CM | POA: Insufficient documentation

## 2022-06-05 ENCOUNTER — Encounter: Payer: Self-pay | Admitting: Adult Health

## 2022-06-05 ENCOUNTER — Ambulatory Visit (INDEPENDENT_AMBULATORY_CARE_PROVIDER_SITE_OTHER): Payer: 59 | Admitting: Adult Health

## 2022-06-05 VITALS — BP 129/83 | HR 92 | Ht 69.0 in | Wt 188.0 lb

## 2022-06-05 DIAGNOSIS — R69 Illness, unspecified: Secondary | ICD-10-CM | POA: Diagnosis not present

## 2022-06-05 DIAGNOSIS — R5383 Other fatigue: Secondary | ICD-10-CM

## 2022-06-05 DIAGNOSIS — Z1211 Encounter for screening for malignant neoplasm of colon: Secondary | ICD-10-CM | POA: Diagnosis not present

## 2022-06-05 DIAGNOSIS — F419 Anxiety disorder, unspecified: Secondary | ICD-10-CM | POA: Diagnosis not present

## 2022-06-05 DIAGNOSIS — Z90721 Acquired absence of ovaries, unilateral: Secondary | ICD-10-CM | POA: Diagnosis not present

## 2022-06-05 DIAGNOSIS — Z90710 Acquired absence of both cervix and uterus: Secondary | ICD-10-CM | POA: Diagnosis not present

## 2022-06-05 DIAGNOSIS — R635 Abnormal weight gain: Secondary | ICD-10-CM | POA: Diagnosis not present

## 2022-06-05 DIAGNOSIS — Z01419 Encounter for gynecological examination (general) (routine) without abnormal findings: Secondary | ICD-10-CM | POA: Diagnosis not present

## 2022-06-05 LAB — HEMOCCULT GUIAC POC 1CARD (OFFICE): Fecal Occult Blood, POC: NEGATIVE

## 2022-06-05 MED ORDER — BUSPIRONE HCL 5 MG PO TABS: ORAL_TABLET | ORAL | 12 refills | Status: DC

## 2022-06-05 NOTE — Progress Notes (Signed)
Patient ID: Julie [name], female   DOB: [birth year], 51 y.o.   MRN: [id number] History of Present Illness: Julie [name] is a 52 year old white female, divorced, sp hysterectomy  in for a well woman gyn exam.  She is complaining of being tired and weight gain. She has had thyroid labs with [facility]. She is working at [employer] in [place] and [employer].  PCP is Dr Hilma Favors.  Current Medications, Allergies, Past Medical History, Past Surgical History, Family History and Social History were reviewed in [employer] record.     Review of Systems: Patient denies any headaches, hearing loss, blurred vision, shortness of breath, chest pain, abdominal pain, problems with bowel movements,(constipated at times), urination, or intercourse. No joint pain or mood swings.  See HPI for positives.   Physical Exam:BP 129/83 (BP Location: Left Arm, Patient Position: Sitting, Cuff Size: Normal)   Pulse 92   Ht 5\' 9"  (1.753 m)   Wt 188 lb (85.3 kg)   BMI 27.76 kg/m   General:  Well developed, well nourished, no acute distress Skin:  Warm and dry Neck:  Midline trachea, normal thyroid, good ROM, no lymphadenopathy Lungs; Clear to auscultation bilaterally Breast:  No dominant palpable mass, retraction, or nipple discharge Cardiovascular: Regular rate and rhythm Abdomen:  Soft, non tender, no hepatosplenomegaly Pelvic:  External genitalia is normal in appearance, no lesions.  The vagina is normal in appearance. Urethra has no lesions or masses. The cervix and uterus are absent. No adnexal masses or tenderness noted.Bladder is non tender, no masses felt. Rectal: Good sphincter tone, no polyps, or hemorrhoids felt.  Hemoccult negative. Extremities/musculoskeletal:  No swelling or varicosities noted, no clubbing or cyanosis Psych:  No mood changes, alert and cooperative,seems happy AA is 4 Fall risk is low    06/05/2022    1:41 PM 02/07/2021    8:43 AM 02/02/2020   11:11 AM  Depression  screen PHQ 2/9  Decreased Interest 0 0 0  Down, Depressed, Hopeless 0 0 0  PHQ - 2 Score 0 0 0  Altered sleeping 0 0 2  Tired, decreased energy 1 0 2  Change in appetite 0 0 0  Feeling bad or failure about yourself  0 0 0  Trouble concentrating 0 0 0  Moving slowly or fidgety/restless 0 0 0  Suicidal thoughts 0 0 0  PHQ-9 Score 1 0 4  Difficult doing work/chores   Not difficult at all       06/05/2022    1:41 PM 02/07/2021    8:44 AM 02/02/2020   11:12 AM  GAD 7 : Generalized Anxiety Score  Nervous, Anxious, on Edge 1 0 0  Control/stop worrying 1 0 0  Worry too much - different things 1 0 1  Trouble relaxing 1 0 0  Restless 0 0 0  Easily annoyed or irritable 0 0 0  Afraid - awful might happen 0 0 0  Total GAD 7 Score 4 0 1  Anxiety Difficulty   Not difficult at all    Upstream - 06/05/22 1338       Pregnancy Intention Screening   Does the patient want to become pregnant in the next year? N/A    Does the patient's partner want to become pregnant in the next year? N/A    Would the patient like to discuss contraceptive options today? N/A      Contraception Wrap Up   Current Method Female Sterilization   hyst   End Method  Female Sterilization   hyst   Contraception Counseling Provided No            Examination chaperoned by Levy Pupa LPN    Impression and Plan: 1. Well woman exam with routine gynecological exam Physical in 1 year Had negative mammogram 06/01/22 Colonoscopy per GI  2. Encounter for screening fecal occult blood testing Hemoccult was negative  - POCT occult blood stool  3. Anxiety Doing good Will refill Buspar Meds ordered this encounter  Medications   busPIRone (BUSPAR) 5 MG tablet    Sig: TAKE (1) TABLET BY MOUTH (3) TIMES DAILY.    Dispense:  90 tablet    Refill:  12    Order Specific Question:   Supervising Provider    Answer:   Elonda Husky, LUTHER H [[id number]]     4. Tired Will check labs  - CBC - Comprehensive metabolic panel  5. Weight  gain Decease calories and walk 30 minutes every day outside of work  6. S/P hysterectomy with oophorectomy

## 2022-06-06 LAB — COMPREHENSIVE METABOLIC PANEL
ALT: 36 IU/L — ABNORMAL HIGH (ref 0–32)
AST: 27 IU/L (ref 0–40)
Albumin/Globulin Ratio: 1.8 (ref 1.2–2.2)
Albumin: 4.6 g/dL (ref 3.8–4.9)
Alkaline Phosphatase: 116 IU/L (ref 44–121)
BUN/Creatinine Ratio: 15 (ref 9–23)
BUN: 12 mg/dL (ref 6–24)
Bilirubin Total: 0.3 mg/dL (ref 0.0–1.2)
CO2: 24 mmol/L (ref 20–29)
Calcium: 9.6 mg/dL (ref 8.7–10.2)
Chloride: 101 mmol/L (ref 96–106)
Creatinine, Ser: 0.78 mg/dL (ref 0.57–1.00)
Globulin, Total: 2.6 g/dL (ref 1.5–4.5)
Glucose: 102 mg/dL — ABNORMAL HIGH (ref 70–99)
Potassium: 4.3 mmol/L (ref 3.5–5.2)
Sodium: 141 mmol/L (ref 134–144)
Total Protein: 7.2 g/dL (ref 6.0–8.5)
eGFR: 91 mL/min/{1.73_m2} (ref >59)

## 2022-06-06 LAB — CBC
Hematocrit: 46.7 % — ABNORMAL HIGH (ref 34.0–46.6)
Hemoglobin: 15.6 g/dL (ref 11.1–15.9)
MCH: 31 pg (ref 26.6–33.0)
MCHC: 33.4 g/dL (ref 31.5–35.7)
MCV: 93 fL (ref 79–97)
Platelets: 366 10*3/uL (ref 150–450)
RBC: 5.03 x10E6/uL (ref 3.77–5.28)
RDW: 12.9 % (ref 11.7–15.4)
WBC: 8 10*3/uL (ref 3.4–10.8)

## 2022-06-13 ENCOUNTER — Other Ambulatory Visit: Payer: Self-pay | Admitting: Adult Health

## 2022-06-13 MED ORDER — BUSPIRONE HCL 10 MG PO TABS: 10.0000 mg | ORAL_TABLET | Freq: Two times a day (BID) | ORAL | 6 refills | Status: DC

## 2022-06-13 NOTE — Progress Notes (Signed)
Change buspar

## 2022-06-22 ENCOUNTER — Other Ambulatory Visit: Payer: Self-pay | Admitting: Gastroenterology

## 2022-06-25 ENCOUNTER — Ambulatory Visit
Admission: RE | Admit: 2022-06-25 | Discharge: 2022-06-25 | Disposition: A | Discharge status: Home / Self Care | Payer: 59 | Source: Ambulatory Visit | Attending: Student | Admitting: Student

## 2022-06-25 DIAGNOSIS — R42 Dizziness and giddiness: Secondary | ICD-10-CM

## 2022-06-25 DIAGNOSIS — R519 Headache, unspecified: Secondary | ICD-10-CM | POA: Diagnosis not present

## 2022-06-26 DIAGNOSIS — R519 Headache, unspecified: Secondary | ICD-10-CM | POA: Diagnosis not present

## 2022-06-26 DIAGNOSIS — R42 Dizziness and giddiness: Secondary | ICD-10-CM | POA: Diagnosis not present

## 2022-06-26 DIAGNOSIS — F411 Generalized anxiety disorder: Secondary | ICD-10-CM | POA: Diagnosis not present

## 2022-06-26 DIAGNOSIS — G479 Sleep disorder, unspecified: Secondary | ICD-10-CM | POA: Diagnosis not present

## 2022-06-26 DIAGNOSIS — R110 Nausea: Secondary | ICD-10-CM | POA: Diagnosis not present

## 2022-08-15 ENCOUNTER — Telehealth: Payer: Self-pay | Admitting: *Deleted

## 2022-08-15 ENCOUNTER — Other Ambulatory Visit: Payer: Self-pay | Admitting: Adult Health

## 2022-08-15 MED ORDER — HYDROXYZINE PAMOATE 25 MG PO CAPS: 25.0000 mg | ORAL_CAPSULE | Freq: Three times a day (TID) | ORAL | 1 refills | Status: DC | PRN

## 2022-08-15 NOTE — Telephone Encounter (Signed)
Requesting that her lab work be drawn now.  Patient said that she was having issues. Very emotional , anxiety, cannot sleep at night. Her PCP changed her anxiety medication , but suggested  to her that she have her Thyroid level checked.  Patient has appointment on 10/23/22 with [facility] and she has orders for Free T4 , and TSH to be done prior to this visit.  May she have them drawn now?

## 2022-08-15 NOTE — Progress Notes (Unsigned)
Rx vistaril prn for anxiety

## 2022-08-15 NOTE — Telephone Encounter (Signed)
Yes, have her go ahead and get them checked to see where she is.

## 2022-08-15 NOTE — Telephone Encounter (Signed)
Patient was called and made aware. 

## 2022-08-17 DIAGNOSIS — E038 Other specified hypothyroidism: Secondary | ICD-10-CM | POA: Diagnosis not present

## 2022-08-17 DIAGNOSIS — E063 Autoimmune thyroiditis: Secondary | ICD-10-CM | POA: Diagnosis not present

## 2022-08-18 LAB — TSH: TSH: 1.42 u[IU]/mL (ref 0.450–4.500)

## 2022-08-18 LAB — T4, FREE: Free T4: 1.36 ng/dL (ref 0.82–1.77)

## 2022-08-29 ENCOUNTER — Telehealth: Payer: Self-pay

## 2022-08-29 NOTE — Telephone Encounter (Signed)
PA for Rabeprazole was approved. Approval letter to be scanned into the pt's chart.

## 2022-10-02 DIAGNOSIS — F411 Generalized anxiety disorder: Secondary | ICD-10-CM | POA: Diagnosis not present

## 2022-10-02 DIAGNOSIS — R519 Headache, unspecified: Secondary | ICD-10-CM | POA: Diagnosis not present

## 2022-10-02 DIAGNOSIS — R110 Nausea: Secondary | ICD-10-CM | POA: Diagnosis not present

## 2022-10-02 DIAGNOSIS — G479 Sleep disorder, unspecified: Secondary | ICD-10-CM | POA: Diagnosis not present

## 2022-10-19 ENCOUNTER — Other Ambulatory Visit: Payer: Self-pay | Admitting: Nurse Practitioner

## 2022-10-19 DIAGNOSIS — E038 Other specified hypothyroidism: Secondary | ICD-10-CM

## 2022-10-20 DIAGNOSIS — E038 Other specified hypothyroidism: Secondary | ICD-10-CM | POA: Diagnosis not present

## 2022-10-20 DIAGNOSIS — E063 Autoimmune thyroiditis: Secondary | ICD-10-CM | POA: Diagnosis not present

## 2022-10-21 LAB — T4, FREE: Free T4: 1.22 ng/dL (ref 0.82–1.77)

## 2022-10-21 LAB — TSH: TSH: 3.05 u[IU]/mL (ref 0.450–4.500)

## 2022-10-23 ENCOUNTER — Encounter: Payer: Self-pay | Admitting: Nurse Practitioner

## 2022-10-23 ENCOUNTER — Ambulatory Visit: Payer: 59 | Admitting: Nurse Practitioner

## 2022-10-23 VITALS — BP 118/69 | HR 112 | Ht 69.0 in | Wt 191.4 lb

## 2022-10-23 DIAGNOSIS — E063 Autoimmune thyroiditis: Secondary | ICD-10-CM | POA: Diagnosis not present

## 2022-10-23 DIAGNOSIS — E038 Other specified hypothyroidism: Secondary | ICD-10-CM

## 2022-10-23 MED ORDER — LEVOTHYROXINE SODIUM 88 MCG PO TABS: 88.0000 ug | ORAL_TABLET | Freq: Every day | ORAL | 1 refills | Status: DC

## 2022-10-23 NOTE — Progress Notes (Signed)
Endocrinology Follow Up Note                                         10/23/2022, 1:49 PM  Subjective:   Subjective    Julie [name] is a 52 y.o.-year-old female patient being seen in follow up after being seen in consultation for hypothyroidism referred by Assunta Found, MD.   Past Medical History:  Diagnosis Date   Anxiety    Arthritis    Right hand ring finger   Bad odor of urine 09/09/2015   Complication of anesthesia    GERD (gastroesophageal reflux disease)    Headache    PONV (postoperative nausea and vomiting)     Past Surgical History:  Procedure Laterality Date   ABDOMINAL HYSTERECTOMY     BIOPSY  08/14/2017   Procedure: BIOPSY;  Surgeon: West Bali, MD;  Location: [facility];  Service: Endoscopy;;  duodenum gastric   BLADDER SURGERY     CLOSED REDUCTION FINGER WITH PERCUTANEOUS PINNING Right    Ring finger   COLONOSCOPY WITH PROPOFOL N/A 01/16/2017   Dr. Darrick Penna: Redundant colon, otherwise normal.  Next anoscopy in 10 years with MAC.   ESOPHAGOGASTRODUODENOSCOPY (EGD) WITH PROPOFOL N/A 08/14/2017   Fields: Gastritis, no H. pylori.  Fundic gland polyps.  Small bowel biopsies negative.    Social History   Socioeconomic History   Marital status: Divorced    Spouse name: Not on file   Number of children: Not on file   Years of education: Not on file   Highest education level: Not on file  Occupational History   Not on file  Tobacco Use   Smoking status: Never   Smokeless tobacco: Never  Vaping Use   Vaping Use: Never used  Substance and Sexual Activity   Alcohol use: Yes    Comment: occassionally   Drug use: No   Sexual activity: Yes    Birth control/protection: Surgical    Comment: hyst  Other Topics Concern   Not on file  Social History Narrative   Not on file   Social Determinants of Health   Financial Resource Strain: Low Risk  (06/05/2022)   Overall Financial  Resource Strain (CARDIA)    Difficulty of Paying Living Expenses: Not hard at all  Food Insecurity: No Food Insecurity (06/05/2022)   Hunger Vital Sign    Worried About Running Out of Food in the Last Year: Never true    Ran Out of Food in the Last Year: Never true  Transportation Needs: No Transportation Needs (06/05/2022)   PRAPARE - Administrator, Civil Service (Medical): No    Lack of Transportation (Non-Medical): No  Physical Activity: Insufficiently Active (06/05/2022)   Exercise Vital Sign    Days of Exercise per Week: 1 day    Minutes of Exercise per Session: 20 min  Stress: No Stress Concern Present (06/05/2022)   [employer] of Occupational Health - Occupational Stress Questionnaire    Feeling of Stress :  Only a little  Social Connections: Moderately Isolated (06/05/2022)   Social Connection and Isolation Panel [NHANES]    Frequency of Communication with Friends and Family: More than three times a week    Frequency of Social Gatherings with Friends and Family: More than three times a week    Attends Religious Services: 1 to 4 times per year    Active Member of [employer] or Organizations: No    Attends Banker Meetings: Never    Marital Status: Divorced    Family History  Adopted: Yes  Problem Relation Age of Onset   Irritable bowel syndrome Son    Colon cancer Neg Hx    Gastric cancer Neg Hx    Esophageal cancer Neg Hx     Outpatient Encounter Medications as of 10/23/2022  Medication Sig   busPIRone (BUSPAR) 10 MG tablet Take 1 tablet (10 mg total) by mouth 2 (two) times daily.   clonazePAM (KLONOPIN) 0.5 MG tablet Take 0.5 mg by mouth at bedtime.   EMGALITY 120 MG/ML SOAJ Inject 120 mg into the skin every 28 (twenty-eight) days.   EPINEPHrine 0.3 mg/0.3 mL IJ SOAJ injection Inject 0.3 mLs into the muscle once as needed.   hydrOXYzine (VISTARIL) 25 MG capsule Take 1 capsule (25 mg total) by mouth every 8 (eight) hours as needed.   Multiple Vitamin  (MULTIVITAMIN WITH MINERALS) TABS tablet Take 1 tablet by mouth daily.   nitrofurantoin, macrocrystal-monohydrate, (MACROBID) 100 MG capsule Take 100 mg by mouth daily.   nortriptyline (PAMELOR) 50 MG capsule Take 40 mg by mouth at bedtime.   RABEprazole (ACIPHEX) 20 MG tablet TAKE (1) TABLET BY MOUTH ONCE DAILY.   rizatriptan (MAXALT) 10 MG tablet Take 10 mg by mouth as needed for migraine.   [DISCONTINUED] levothyroxine (SYNTHROID) 75 MCG tablet Take 1 tablet (75 mcg total) by mouth daily before breakfast.   levothyroxine (SYNTHROID) 88 MCG tablet Take 1 tablet (88 mcg total) by mouth daily before breakfast.   [DISCONTINUED] QUEtiapine (SEROQUEL) 25 MG tablet daily. (Patient not taking: Reported on 10/23/2022)   No facility-administered encounter medications on file as of 10/23/2022.    ALLERGIES: Allergies  Allergen Reactions   Compazine [Prochlorperazine] Hives   Demerol [Meperidine] Nausea And Vomiting   Ibuprofen Other (See Comments)    Stomach issues   VACCINATION STATUS: Immunization History  Administered Date(s) Administered   Tdap 10/05/2013, 08/03/2014     HPI   Julie [name]  is a patient with the above medical history. she was recently diagnosed with hypothyroidism at approximate age of 62 years, which required subsequent initiation of thyroid hormone replacement. She is currently on Levothyroxine 75 micrograms. she reports compliance to this medication:  Taking it daily on empty stomach with water, separated by >30 minutes before breakfast and other medications , and by at least 4 hours from calcium, iron, PPIs, multivitamins .  I reviewed patient's thyroid tests:  Lab Results  Component Value Date   TSH 3.050 10/20/2022   TSH 1.420 08/17/2022   TSH 1.330 04/07/2022   TSH 1.070 12/12/2021   TSH 0.754 11/23/2021   TSH 2.420 10/04/2021   TSH 8.22 (A) 08/31/2021   TSH 2.920 02/10/2021   TSH 2.150 02/02/2020   TSH 1.590 01/31/2019   FREET4 1.22 10/20/2022    FREET4 1.36 08/17/2022   FREET4 1.32 04/07/2022   FREET4 1.32 12/12/2021   FREET4 1.33 11/23/2021   FREET4 0.99 10/04/2021     Pt describes: - fatigue but attributes  this to working 2 jobs - weight gain  Pt denies feeling nodules in neck, hoarseness, dysphagia/odynophagia, SOB with lying down.  she was adopted so she does not know her biological family's medical history.  No history of radiation therapy to head or neck.  No recent use of iodine supplements.  She does take a Biotin supplement and multivitamin.  I reviewed her chart and she also has a history of GERD.  Review of systems  Constitutional: + increasing body weight,  current Body mass index is 28.26 kg/m. , + fatigue, no subjective hyperthermia, no subjective hypothermia, + chronic headaches (seeing neurology) Eyes: no blurry vision, no xerophthalmia ENT: no sore throat, no nodules palpated in throat, no dysphagia/odynophagia, no hoarseness Cardiovascular: no chest pain, no shortness of breath, no palpitations, no leg swelling Respiratory: no cough, no shortness of breath Gastrointestinal: no nausea/vomiting/diarrhea Musculoskeletal: no muscle/joint aches Skin: no rashes, no hyperemia Neurological: no tremors, no numbness, no tingling, no dizziness Psychiatric: no depression, no anxiety   Objective:   Objective     BP 118/69 (BP Location: Left Arm, Patient Position: Sitting, Cuff Size: Large)   Pulse (!) 112   Ht 5\' 9"  (1.753 m)   Wt 191 lb 6.4 oz (86.8 kg)   BMI 28.26 kg/m  Wt Readings from Last 3 Encounters:  10/23/22 191 lb 6.4 oz (86.8 kg)  06/05/22 188 lb (85.3 kg)  04/17/22 184 lb 6.4 oz (83.6 kg)    BP Readings from Last 3 Encounters:  10/23/22 118/69  06/05/22 129/83  04/17/22 116/74     Physical Exam- Limited  Constitutional:  Body mass index is 28.26 kg/m. , not in acute distress, normal state of mind Eyes:  EOMI, no exophthalmos Musculoskeletal: no gross deformities, strength intact in  all four extremities, no gross restriction of joint movements Skin:  no rashes, no hyperemia Neurological: no tremor with outstretched hands   CMP ( most recent) CMP     Component Value Date/Time   NA 141 06/05/2022 1426   K 4.3 06/05/2022 1426   CL 101 06/05/2022 1426   CO2 24 06/05/2022 1426   GLUCOSE 102 (H) 06/05/2022 1426   GLUCOSE 96 05/11/2021 1154   BUN 12 06/05/2022 1426   CREATININE 0.78 06/05/2022 1426   CREATININE 0.65 09/17/2013 0958   CALCIUM 9.6 06/05/2022 1426   PROT 7.2 06/05/2022 1426   ALBUMIN 4.6 06/05/2022 1426   AST 27 06/05/2022 1426   ALT 36 (H) 06/05/2022 1426   ALKPHOS 116 06/05/2022 1426   BILITOT 0.3 06/05/2022 1426   GFRNONAA >60 05/11/2021 1154   GFRAA 99 02/02/2020 1145     Diabetic Labs (most recent): Lab Results  Component Value Date   HGBA1C 5.9 (H) 09/09/2015     Lipid Panel ( most recent) Lipid Panel     Component Value Date/Time   CHOL 184 02/10/2021 0937   TRIG 125 02/10/2021 0937   HDL 48 02/10/2021 0937   CHOLHDL 3.8 02/10/2021 0937   CHOLHDL 4.1 09/17/2013 0958   VLDL 25 09/17/2013 0958   LDLCALC 114 (H) 02/10/2021 0937   LABVLDL 22 02/10/2021 0937       Lab Results  Component Value Date   TSH 3.050 10/20/2022   TSH 1.420 08/17/2022   TSH 1.330 04/07/2022   TSH 1.070 12/12/2021   TSH 0.754 11/23/2021   TSH 2.420 10/04/2021   TSH 8.22 (A) 08/31/2021   TSH 2.920 02/10/2021   TSH 2.150 02/02/2020   TSH 1.590 01/31/2019  FREET4 1.22 10/20/2022   FREET4 1.36 08/17/2022   FREET4 1.32 04/07/2022   FREET4 1.32 12/12/2021   FREET4 1.33 11/23/2021   FREET4 0.99 10/04/2021     Latest Reference Range & Units 11/23/21 16:04 12/12/21 13:40 04/07/22 08:35 08/17/22 10:34 10/20/22 08:07  TSH 0.450 - 4.500 uIU/mL 0.754 1.070 1.330 1.420 3.050  Triiodothyronine,Free,Serum 2.0 - 4.4 pg/mL  3.1     T4,Free(Direct) 0.82 - 1.77 ng/dL 5.78 4.69 6.29 5.28 4.13    Assessment & Plan:   ASSESSMENT / PLAN:  1.  Hypothyroidism- d/t Hashimotos thyroiditis   Patient with new diagnosis of hypothyroidism, on levothyroxine therapy (started on 5/5). On physical exam, patient does not have gross goiter, thyroid nodules, or neck compression symptoms.  -Her previsit thyroid function tests are consistent with slight under-replacement.  She is advised to increase her Levothyroxine to 88 mcg po daily before breakfast.   - We discussed about correct intake of levothyroxine, at fasting, with water, separated by at least 30 minutes from breakfast, and separated by more than 4 hours from calcium, iron, multivitamins, acid reflux medications (PPIs). -Patient is made aware of the fact that thyroid hormone replacement is needed for life, dose to be adjusted by periodic monitoring of thyroid function tests.    -Due to absence of clinical goiter, no need for thyroid ultrasound.   We did go over WFPB lifestyle changes that can help her with weight loss, fatigue and overall health. The following Lifestyle Medicine recommendations according to American College of Lifestyle Medicine [facility]) were discussed and offered to patient and she agrees to start the journey:  A. Whole Foods, Plant-based plate comprising of fruits and vegetables, plant-based proteins, whole-grain carbohydrates was discussed in detail with the patient.   A list for source of those nutrients were also provided to the patient.  Patient will use only water or unsweetened tea for hydration. B.  The need to stay away from risky substances including alcohol, smoking; obtaining 7 to 9 hours of restorative sleep, at least 150 minutes of moderate intensity exercise weekly, the importance of healthy social connections,  and stress reduction techniques were discussed. C.  A full color page of  Calorie density of various food groups per pound showing examples of each food groups was provided to the patient.    I spent  37  minutes in the care of the patient today  including review of labs from Thyroid Function, CMP, and other relevant labs ; imaging/biopsy records (current and previous including abstractions from other facilities); face-to-face time discussing  her lab results and symptoms, medications doses, her options of short and long term treatment based on the latest standards of care / guidelines;   and documenting the encounter.  Stann Mainland Bogacz  participated in the discussions, expressed understanding, and voiced agreement with the above plans.  All questions were answered to her satisfaction. she is encouraged to contact clinic should she have any questions or concerns prior to her return visit.   FOLLOW UP PLAN:  Return in about 4 months (around 02/22/2023) for Thyroid follow up, Previsit labs.  Ronny Bacon, [facility] [address] [place], [place] [address] Phone: [phone] Fax: [phone]  10/23/2022, 1:49 PM

## 2022-10-23 NOTE — Patient Instructions (Signed)

## 2022-11-21 DIAGNOSIS — E6609 Other obesity due to excess calories: Secondary | ICD-10-CM | POA: Diagnosis not present

## 2022-11-21 DIAGNOSIS — Z6828 Body mass index (BMI) 28.0-28.9, adult: Secondary | ICD-10-CM | POA: Diagnosis not present

## 2022-11-21 DIAGNOSIS — M25561 Pain in right knee: Secondary | ICD-10-CM | POA: Diagnosis not present

## 2022-11-30 DIAGNOSIS — R519 Headache, unspecified: Secondary | ICD-10-CM | POA: Diagnosis not present

## 2022-11-30 DIAGNOSIS — M542 Cervicalgia: Secondary | ICD-10-CM | POA: Diagnosis not present

## 2022-11-30 DIAGNOSIS — S161XXA Strain of muscle, fascia and tendon at neck level, initial encounter: Secondary | ICD-10-CM | POA: Diagnosis not present

## 2022-11-30 DIAGNOSIS — Y92410 Unspecified street and highway as the place of occurrence of the external cause: Secondary | ICD-10-CM | POA: Diagnosis not present

## 2022-11-30 DIAGNOSIS — G4486 Cervicogenic headache: Secondary | ICD-10-CM | POA: Diagnosis not present

## 2023-01-03 ENCOUNTER — Other Ambulatory Visit: Payer: Self-pay | Admitting: Gastroenterology

## 2023-01-08 DIAGNOSIS — F411 Generalized anxiety disorder: Secondary | ICD-10-CM | POA: Diagnosis not present

## 2023-01-08 DIAGNOSIS — G43719 Chronic migraine without aura, intractable, without status migrainosus: Secondary | ICD-10-CM | POA: Diagnosis not present

## 2023-01-08 DIAGNOSIS — G479 Sleep disorder, unspecified: Secondary | ICD-10-CM | POA: Diagnosis not present

## 2023-01-15 NOTE — Progress Notes (Unsigned)
GI Office Note    Referring Provider: Assunta Found, MD Primary Care Physician:  Assunta Found, MD  Primary Gastroenterologist: Hennie Duos. Marletta Lor, DO   Chief Complaint   No chief complaint on file.   History of Present Illness   Julie [name] is a 52 y.o. female presenting today for f/u. Last seen 01/2021. H/o GERD. EGD April 2019 with multiple fundic gland polyps, gastritis/duodenitis without H. pylori. Colonoscopy up-to-date 2018, recommended follow-up in 2028.         Medications   Current Outpatient Medications  Medication Sig Dispense Refill   busPIRone (BUSPAR) 10 MG tablet Take 1 tablet (10 mg total) by mouth 2 (two) times daily. 60 tablet 6   clonazePAM (KLONOPIN) 0.5 MG tablet Take 0.5 mg by mouth at bedtime.     EMGALITY 120 MG/ML SOAJ Inject 120 mg into the skin every 28 (twenty-eight) days.     EPINEPHrine 0.3 mg/0.3 mL IJ SOAJ injection Inject 0.3 mLs into the muscle once as needed.     hydrOXYzine (VISTARIL) 25 MG capsule Take 1 capsule (25 mg total) by mouth every 8 (eight) hours as needed. 30 capsule 1   levothyroxine (SYNTHROID) 88 MCG tablet Take 1 tablet (88 mcg total) by mouth daily before breakfast. 90 tablet 1   Multiple Vitamin (MULTIVITAMIN WITH MINERALS) TABS tablet Take 1 tablet by mouth daily.     nitrofurantoin, macrocrystal-monohydrate, (MACROBID) 100 MG capsule Take 100 mg by mouth daily.     nortriptyline (PAMELOR) 50 MG capsule Take 40 mg by mouth at bedtime.     RABEprazole (ACIPHEX) 20 MG tablet TAKE (1) TABLET BY MOUTH ONCE DAILY. 30 tablet 5   rizatriptan (MAXALT) 10 MG tablet Take 10 mg by mouth as needed for migraine.     No current facility-administered medications for this visit.    Allergies   Allergies as of 01/16/2023 - Review Complete 10/23/2022  Allergen Reaction Noted   Compazine [prochlorperazine] Hives 05/11/2021   Demerol [meperidine] Nausea And Vomiting 11/21/2011   Ibuprofen Other (See Comments) 08/17/2017      Past Medical History   Past Medical History:  Diagnosis Date   Anxiety    Arthritis    Right hand ring finger   Bad odor of urine 09/09/2015   Complication of anesthesia    GERD (gastroesophageal reflux disease)    Headache    PONV (postoperative nausea and vomiting)     Past Surgical History   Past Surgical History:  Procedure Laterality Date   ABDOMINAL HYSTERECTOMY     BIOPSY  08/14/2017   Procedure: BIOPSY;  Surgeon: West Bali, MD;  Location: [facility];  Service: Endoscopy;;  duodenum gastric   BLADDER SURGERY     CLOSED REDUCTION FINGER WITH PERCUTANEOUS PINNING Right    Ring finger   COLONOSCOPY WITH PROPOFOL N/A 01/16/2017   Dr. Darrick Penna: Redundant colon, otherwise normal.  Next anoscopy in 10 years with MAC.   ESOPHAGOGASTRODUODENOSCOPY (EGD) WITH PROPOFOL N/A 08/14/2017   Fields: Gastritis, no H. pylori.  Fundic gland polyps.  Small bowel biopsies negative.    Past Family History   Family History  Adopted: Yes  Problem Relation Age of Onset   Irritable bowel syndrome Son    Colon cancer Neg Hx    Gastric cancer Neg Hx    Esophageal cancer Neg Hx     Past Social History   Social History   Socioeconomic History   Marital status: Divorced  Spouse name: Not on file   Number of children: Not on file   Years of education: Not on file   Highest education level: Not on file  Occupational History   Not on file  Tobacco Use   Smoking status: Never   Smokeless tobacco: Never  Vaping Use   Vaping status: Never Used  Substance and Sexual Activity   Alcohol use: Yes    Comment: occassionally   Drug use: No   Sexual activity: Yes    Birth control/protection: Surgical    Comment: hyst  Other Topics Concern   Not on file  Social History Narrative   Not on file   Social Determinants of Health   Financial Resource Strain: Low Risk  (06/05/2022)   Overall Financial Resource Strain (CARDIA)    Difficulty of Paying Living Expenses: Not hard at  all  Food Insecurity: No Food Insecurity (06/05/2022)   Hunger Vital Sign    Worried About Running Out of Food in the Last Year: Never true    Ran Out of Food in the Last Year: Never true  Transportation Needs: No Transportation Needs (06/05/2022)   PRAPARE - Administrator, Civil Service (Medical): No    Lack of Transportation (Non-Medical): No  Physical Activity: Insufficiently Active (06/05/2022)   Exercise Vital Sign    Days of Exercise per Week: 1 day    Minutes of Exercise per Session: 20 min  Stress: No Stress Concern Present (06/05/2022)   [employer] of Occupational Health - Occupational Stress Questionnaire    Feeling of Stress : Only a little  Social Connections: Moderately Isolated (06/05/2022)   Social Connection and Isolation Panel [NHANES]    Frequency of Communication with Friends and Family: More than three times a week    Frequency of Social Gatherings with Friends and Family: More than three times a week    Attends Religious Services: 1 to 4 times per year    Active Member of [employer] or Organizations: No    Attends Banker Meetings: Never    Marital Status: Divorced  Catering manager Violence: Not At Risk (06/05/2022)   Humiliation, Afraid, Rape, and Kick questionnaire    Fear of Current or Ex-Partner: No    Emotionally Abused: No    Physically Abused: No    Sexually Abused: No    Review of Systems   General: Negative for anorexia, weight loss, fever, chills, fatigue, weakness. ENT: Negative for hoarseness, difficulty swallowing , nasal congestion. CV: Negative for chest pain, angina, palpitations, dyspnea on exertion, peripheral edema.  Respiratory: Negative for dyspnea at rest, dyspnea on exertion, cough, sputum, wheezing.  GI: See history of present illness. GU:  Negative for dysuria, hematuria, urinary incontinence, urinary frequency, nocturnal urination.  Endo: Negative for unusual weight change.     Physical Exam   There were no  vitals taken for this visit.   General: Well-nourished, well-developed in no acute distress.  Eyes: No icterus. Mouth: Oropharyngeal mucosa moist and pink , no lesions erythema or exudate. Lungs: Clear to auscultation bilaterally.  Heart: Regular rate and rhythm, no murmurs rubs or gallops.  Abdomen: Bowel sounds are normal, nontender, nondistended, no hepatosplenomegaly or masses,  no abdominal bruits or hernia , no rebound or guarding.  Rectal: ***  Extremities: No lower extremity edema. No clubbing or deformities. Neuro: Alert and oriented x 4   Skin: Warm and dry, no jaundice.   Psych: Alert and cooperative, normal mood and affect.  Labs  Lab Results  Component Value Date   NA 141 06/05/2022   CL 101 06/05/2022   K 4.3 06/05/2022   CO2 24 06/05/2022   BUN 12 06/05/2022   CREATININE 0.78 06/05/2022   EGFR 91 06/05/2022   CALCIUM 9.6 06/05/2022   ALBUMIN 4.6 06/05/2022   GLUCOSE 102 (H) 06/05/2022   Lab Results  Component Value Date   ALT 36 (H) 06/05/2022   AST 27 06/05/2022   ALKPHOS 116 06/05/2022   BILITOT 0.3 06/05/2022   Lab Results  Component Value Date   WBC 8.0 06/05/2022   HGB 15.6 06/05/2022   HCT 46.7 (H) 06/05/2022   MCV 93 06/05/2022   PLT 366 06/05/2022   Lab Results  Component Value Date   TSH 3.050 10/20/2022    Imaging Studies   No results found.  Assessment       PLAN   ***   Leanna Battles. Melvyn Neth, MHS, PA-C [facility]

## 2023-01-16 ENCOUNTER — Ambulatory Visit: Payer: 59 | Admitting: Gastroenterology

## 2023-01-16 ENCOUNTER — Encounter: Payer: Self-pay | Admitting: Gastroenterology

## 2023-01-16 VITALS — BP 132/81 | HR 103 | Temp 97.9°F | Ht 69.5 in | Wt 192.4 lb

## 2023-01-16 DIAGNOSIS — K219 Gastro-esophageal reflux disease without esophagitis: Secondary | ICD-10-CM

## 2023-01-16 MED ORDER — RABEPRAZOLE SODIUM 20 MG PO TBEC: 20.0000 mg | DELAYED_RELEASE_TABLET | Freq: Every day | ORAL | 3 refills | Status: DC

## 2023-01-16 NOTE — Patient Instructions (Signed)
RX for rabeprazole (Aciphex) 20mg  daily sent to your pharmacy.  Return office visit in 2 years or call sooner if needed.

## 2023-01-18 DIAGNOSIS — G43719 Chronic migraine without aura, intractable, without status migrainosus: Secondary | ICD-10-CM | POA: Diagnosis not present

## 2023-02-22 DIAGNOSIS — E063 Autoimmune thyroiditis: Secondary | ICD-10-CM | POA: Diagnosis not present

## 2023-02-22 DIAGNOSIS — E038 Other specified hypothyroidism: Secondary | ICD-10-CM | POA: Diagnosis not present

## 2023-02-23 LAB — TSH: TSH: 2.32 u[IU]/mL (ref 0.450–4.500)

## 2023-02-23 LAB — T4, FREE: Free T4: 1.36 ng/dL (ref 0.82–1.77)

## 2023-02-26 ENCOUNTER — Ambulatory Visit: Payer: 59 | Admitting: Nurse Practitioner

## 2023-02-26 ENCOUNTER — Encounter: Payer: Self-pay | Admitting: Nurse Practitioner

## 2023-02-26 VITALS — BP 123/80 | HR 86 | Ht 69.5 in | Wt 191.8 lb

## 2023-02-26 DIAGNOSIS — E063 Autoimmune thyroiditis: Secondary | ICD-10-CM

## 2023-02-26 MED ORDER — LEVOTHYROXINE SODIUM 100 MCG PO TABS: 100.0000 ug | ORAL_TABLET | Freq: Every day | ORAL | 1 refills | Status: DC

## 2023-02-26 NOTE — Progress Notes (Signed)
Endocrinology Follow Up Note                                         02/26/2023, 3:21 PM  Subjective:   Subjective    Julie [name] is a 52 y.o.-year-old female patient being seen in follow up after being seen in consultation for hypothyroidism referred by Assunta Found, MD.   Past Medical History:  Diagnosis Date   Anxiety    Arthritis    Right hand ring finger   Bad odor of urine 09/09/2015   Complication of anesthesia    GERD (gastroesophageal reflux disease)    Headache    PONV (postoperative nausea and vomiting)     Past Surgical History:  Procedure Laterality Date   ABDOMINAL HYSTERECTOMY     BIOPSY  08/14/2017   Procedure: BIOPSY;  Surgeon: West Bali, MD;  Location: [facility];  Service: Endoscopy;;  duodenum gastric   BLADDER SURGERY     CLOSED REDUCTION FINGER WITH PERCUTANEOUS PINNING Right    Ring finger   COLONOSCOPY WITH PROPOFOL N/A 01/16/2017   Dr. Darrick Penna: Redundant colon, otherwise normal.  Next anoscopy in 10 years with MAC.   ESOPHAGOGASTRODUODENOSCOPY (EGD) WITH PROPOFOL N/A 08/14/2017   Fields: Gastritis, no H. pylori.  Fundic gland polyps.  Small bowel biopsies negative.    Social History   Socioeconomic History   Marital status: Divorced    Spouse name: Not on file   Number of children: Not on file   Years of education: Not on file   Highest education level: Not on file  Occupational History   Not on file  Tobacco Use   Smoking status: Never   Smokeless tobacco: Never  Vaping Use   Vaping status: Never Used  Substance and Sexual Activity   Alcohol use: Yes    Comment: occassionally   Drug use: No   Sexual activity: Yes    Birth control/protection: Surgical    Comment: hyst  Other Topics Concern   Not on file  Social History Narrative   Not on file   Social Determinants of Health   Financial Resource Strain: Low Risk  (06/05/2022)   Overall Financial  Resource Strain (CARDIA)    Difficulty of Paying Living Expenses: Not hard at all  Food Insecurity: No Food Insecurity (06/05/2022)   Hunger Vital Sign    Worried About Running Out of Food in the Last Year: Never true    Ran Out of Food in the Last Year: Never true  Transportation Needs: No Transportation Needs (06/05/2022)   PRAPARE - Administrator, Civil Service (Medical): No    Lack of Transportation (Non-Medical): No  Physical Activity: Insufficiently Active (06/05/2022)   Exercise Vital Sign    Days of Exercise per Week: 1 day    Minutes of Exercise per Session: 20 min  Stress: No Stress Concern Present (06/05/2022)   [employer] of Occupational Health - Occupational Stress Questionnaire    Feeling of Stress :  Only a little  Social Connections: Moderately Isolated (06/05/2022)   Social Connection and Isolation Panel [NHANES]    Frequency of Communication with Friends and Family: More than three times a week    Frequency of Social Gatherings with Friends and Family: More than three times a week    Attends Religious Services: 1 to 4 times per year    Active Member of [employer] or Organizations: No    Attends Banker Meetings: Never    Marital Status: Divorced    Family History  Adopted: Yes  Problem Relation Age of Onset   Irritable bowel syndrome Son    Colon cancer Neg Hx    Gastric cancer Neg Hx    Esophageal cancer Neg Hx     Outpatient Encounter Medications as of 02/26/2023  Medication Sig   busPIRone (BUSPAR) 10 MG tablet Take 1 tablet (10 mg total) by mouth 2 (two) times daily.   celecoxib (CELEBREX) 200 MG capsule Take 200 mg by mouth 2 (two) times daily as needed.   clonazePAM (KLONOPIN) 0.5 MG tablet Take 0.5 mg by mouth at bedtime.   EMGALITY 120 MG/ML SOAJ Inject 120 mg into the skin every 28 (twenty-eight) days.   EPINEPHrine 0.3 mg/0.3 mL IJ SOAJ injection Inject 0.3 mLs into the muscle once as needed.   Multiple Vitamin (MULTIVITAMIN WITH  MINERALS) TABS tablet Take 1 tablet by mouth daily.   nitrofurantoin, macrocrystal-monohydrate, (MACROBID) 100 MG capsule Take 100 mg by mouth daily.   nortriptyline (PAMELOR) 10 MG capsule Take 40 mg by mouth at bedtime.   RABEprazole (ACIPHEX) 20 MG tablet Take 1 tablet (20 mg total) by mouth daily before breakfast.   rizatriptan (MAXALT) 10 MG tablet Take 10 mg by mouth as needed for migraine.   traZODone (DESYREL) 100 MG tablet Take 100 mg by mouth at bedtime.   [DISCONTINUED] levothyroxine (SYNTHROID) 88 MCG tablet Take 1 tablet (88 mcg total) by mouth daily before breakfast.   levothyroxine (SYNTHROID) 100 MCG tablet Take 1 tablet (100 mcg total) by mouth daily before breakfast.   No facility-administered encounter medications on file as of 02/26/2023.    ALLERGIES: Allergies  Allergen Reactions   Compazine [Prochlorperazine] Hives   Demerol [Meperidine] Nausea And Vomiting   Ibuprofen Other (See Comments)    Stomach issues   VACCINATION STATUS: Immunization History  Administered Date(s) Administered   Tdap 10/05/2013, 08/03/2014     HPI   Julie [name]  is a patient with the above medical history. she was recently diagnosed with hypothyroidism at approximate age of 42 years, which required subsequent initiation of thyroid hormone replacement. She is currently on Levothyroxine 88 micrograms. she reports compliance to this medication:  Taking it daily on empty stomach with water, separated by >30 minutes before breakfast and other medications , and by at least 4 hours from calcium, iron, PPIs, multivitamins .  I reviewed patient's thyroid tests:  Lab Results  Component Value Date   TSH 2.320 02/22/2023   TSH 3.050 10/20/2022   TSH 1.420 08/17/2022   TSH 1.330 04/07/2022   TSH 1.070 12/12/2021   TSH 0.754 11/23/2021   TSH 2.420 10/04/2021   TSH 8.22 (A) 08/31/2021   TSH 2.920 02/10/2021   TSH 2.150 02/02/2020   FREET4 1.36 02/22/2023   FREET4 1.22 10/20/2022    FREET4 1.36 08/17/2022   FREET4 1.32 04/07/2022   FREET4 1.32 12/12/2021   FREET4 1.33 11/23/2021   FREET4 0.99 10/04/2021     Pt  describes: - fatigue but attributes this to working 2 jobs - weight gain  Pt denies feeling nodules in neck, hoarseness, dysphagia/odynophagia, SOB with lying down.  she was adopted so she does not know her biological family's medical history.  No history of radiation therapy to head or neck.  No recent use of iodine supplements.  She does take a Biotin supplement and multivitamin.  I reviewed her chart and she also has a history of GERD.  Review of systems  Constitutional: +stable body weight- difficulty losing weight,  current Body mass index is 27.92 kg/m. , + fatigue, no subjective hyperthermia, no subjective hypothermia, + chronic headaches (seeing neurology and is now on Botox injections which seem to be working) Eyes: no blurry vision, no xerophthalmia ENT: no sore throat, no nodules palpated in throat, no dysphagia/odynophagia, no hoarseness Cardiovascular: no chest pain, no shortness of breath, no palpitations, no leg swelling Respiratory: no cough, no shortness of breath Gastrointestinal: no nausea/vomiting/diarrhea Musculoskeletal: no muscle/joint aches Skin: no rashes, no hyperemia Neurological: no tremors, no numbness, no tingling, no dizziness Psychiatric: no depression, no anxiety   Objective:   Objective     BP 123/80 (BP Location: Right Arm, Patient Position: Sitting, Cuff Size: Large)   Pulse 86   Ht 5' 9.5" (1.765 m)   Wt 191 lb 12.8 oz (87 kg)   BMI 27.92 kg/m  Wt Readings from Last 3 Encounters:  02/26/23 191 lb 12.8 oz (87 kg)  01/16/23 192 lb 6.4 oz (87.3 kg)  10/23/22 191 lb 6.4 oz (86.8 kg)    BP Readings from Last 3 Encounters:  02/26/23 123/80  01/16/23 132/81  10/23/22 118/69      Physical Exam- Limited  Constitutional:  Body mass index is 27.92 kg/m. , not in acute distress, normal state of mind Eyes:   EOMI, no exophthalmos Musculoskeletal: no gross deformities, strength intact in all four extremities, no gross restriction of joint movements Skin:  no rashes, no hyperemia Neurological: no tremor with outstretched hands   CMP ( most recent) CMP     Component Value Date/Time   NA 141 06/05/2022 1426   K 4.3 06/05/2022 1426   CL 101 06/05/2022 1426   CO2 24 06/05/2022 1426   GLUCOSE 102 (H) 06/05/2022 1426   GLUCOSE 96 05/11/2021 1154   BUN 12 06/05/2022 1426   CREATININE 0.78 06/05/2022 1426   CREATININE 0.65 09/17/2013 0958   CALCIUM 9.6 06/05/2022 1426   PROT 7.2 06/05/2022 1426   ALBUMIN 4.6 06/05/2022 1426   AST 27 06/05/2022 1426   ALT 36 (H) 06/05/2022 1426   ALKPHOS 116 06/05/2022 1426   BILITOT 0.3 06/05/2022 1426   GFRNONAA >60 05/11/2021 1154   GFRAA 99 02/02/2020 1145     Diabetic Labs (most recent): Lab Results  Component Value Date   HGBA1C 5.9 (H) 09/09/2015     Lipid Panel ( most recent) Lipid Panel     Component Value Date/Time   CHOL 184 02/10/2021 0937   TRIG 125 02/10/2021 0937   HDL 48 02/10/2021 0937   CHOLHDL 3.8 02/10/2021 0937   CHOLHDL 4.1 09/17/2013 0958   VLDL 25 09/17/2013 0958   LDLCALC 114 (H) 02/10/2021 0937   LABVLDL 22 02/10/2021 0937       Lab Results  Component Value Date   TSH 2.320 02/22/2023   TSH 3.050 10/20/2022   TSH 1.420 08/17/2022   TSH 1.330 04/07/2022   TSH 1.070 12/12/2021   TSH 0.754 11/23/2021  TSH 2.420 10/04/2021   TSH 8.22 (A) 08/31/2021   TSH 2.920 02/10/2021   TSH 2.150 02/02/2020   FREET4 1.36 02/22/2023   FREET4 1.22 10/20/2022   FREET4 1.36 08/17/2022   FREET4 1.32 04/07/2022   FREET4 1.32 12/12/2021   FREET4 1.33 11/23/2021   FREET4 0.99 10/04/2021     Latest Reference Range & Units 11/23/21 16:04 12/12/21 13:40 04/07/22 08:35 08/17/22 10:34 10/20/22 08:07 02/22/23 08:27  TSH 0.450 - 4.500 uIU/mL 0.754 1.070 1.330 1.420 3.050 2.320  Triiodothyronine,Free,Serum 2.0 - 4.4 pg/mL  3.1       T4,Free(Direct) 0.82 - 1.77 ng/dL 1.19 1.47 8.29 5.62 1.30 1.36    Assessment & Plan:   ASSESSMENT / PLAN:  1. Hypothyroidism- d/t Hashimotos thyroiditis   Patient with new diagnosis of hypothyroidism, on levothyroxine therapy (started on 5/5). On physical exam, patient does not have gross goiter, thyroid nodules, or neck compression symptoms.  -Her previsit thyroid function tests are consistent with slight under-replacement.  She is advised to increase her Levothyroxine to 100 mcg po daily before breakfast.   - We discussed about correct intake of levothyroxine, at fasting, with water, separated by at least 30 minutes from breakfast, and separated by more than 4 hours from calcium, iron, multivitamins, acid reflux medications (PPIs). -Patient is made aware of the fact that thyroid hormone replacement is needed for life, dose to be adjusted by periodic monitoring of thyroid function tests.  -Due to absence of clinical goiter, no need for thyroid ultrasound.   We did go over WFPB lifestyle changes that can help her with weight loss, fatigue and overall health. The following Lifestyle Medicine recommendations according to American College of Lifestyle Medicine [facility]) were discussed and offered to patient and she agrees to start the journey:  A. Whole Foods, Plant-based plate comprising of fruits and vegetables, plant-based proteins, whole-grain carbohydrates was discussed in detail with the patient.   A list for source of those nutrients were also provided to the patient.  Patient will use only water or unsweetened tea for hydration. B.  The need to stay away from risky substances including alcohol, smoking; obtaining 7 to 9 hours of restorative sleep, at least 150 minutes of moderate intensity exercise weekly, the importance of healthy social connections,  and stress reduction techniques were discussed. C.  A full color page of  Calorie density of various food groups per pound showing  examples of each food groups was provided to the patient.  We did talk about the potential to try weight loss medications in the future.    I spent  30  minutes in the care of the patient today including review of labs from Thyroid Function, CMP, and other relevant labs ; imaging/biopsy records (current and previous including abstractions from other facilities); face-to-face time discussing  her lab results and symptoms, medications doses, her options of short and long term treatment based on the latest standards of care / guidelines;   and documenting the encounter.  Stann Mainland [place]  participated in the discussions, expressed understanding, and voiced agreement with the above plans.  All questions were answered to her satisfaction. she is encouraged to contact clinic should she have any questions or concerns prior to her return visit.   FOLLOW UP PLAN:  Return in about 3 months (around 05/29/2023) for Thyroid follow up, Previsit labs.  Ronny Bacon, [facility] [address] [place], [place] [address] Phone: [phone] Fax: [phone]  02/26/2023, 3:21 PM

## 2023-03-12 ENCOUNTER — Other Ambulatory Visit: Payer: Self-pay | Admitting: Nurse Practitioner

## 2023-03-22 DIAGNOSIS — E663 Overweight: Secondary | ICD-10-CM | POA: Diagnosis not present

## 2023-03-22 DIAGNOSIS — I10 Essential (primary) hypertension: Secondary | ICD-10-CM | POA: Diagnosis not present

## 2023-03-22 DIAGNOSIS — Z6828 Body mass index (BMI) 28.0-28.9, adult: Secondary | ICD-10-CM | POA: Diagnosis not present

## 2023-03-22 DIAGNOSIS — E039 Hypothyroidism, unspecified: Secondary | ICD-10-CM | POA: Diagnosis not present

## 2023-03-22 DIAGNOSIS — G2581 Restless legs syndrome: Secondary | ICD-10-CM | POA: Diagnosis not present

## 2023-04-02 DIAGNOSIS — F411 Generalized anxiety disorder: Secondary | ICD-10-CM | POA: Diagnosis not present

## 2023-04-02 DIAGNOSIS — R110 Nausea: Secondary | ICD-10-CM | POA: Diagnosis not present

## 2023-04-02 DIAGNOSIS — G43719 Chronic migraine without aura, intractable, without status migrainosus: Secondary | ICD-10-CM | POA: Diagnosis not present

## 2023-04-02 DIAGNOSIS — R42 Dizziness and giddiness: Secondary | ICD-10-CM | POA: Diagnosis not present

## 2023-04-02 DIAGNOSIS — G479 Sleep disorder, unspecified: Secondary | ICD-10-CM | POA: Diagnosis not present

## 2023-04-18 DIAGNOSIS — F419 Anxiety disorder, unspecified: Secondary | ICD-10-CM | POA: Diagnosis not present

## 2023-04-18 DIAGNOSIS — E079 Disorder of thyroid, unspecified: Secondary | ICD-10-CM | POA: Diagnosis not present

## 2023-04-18 DIAGNOSIS — G43719 Chronic migraine without aura, intractable, without status migrainosus: Secondary | ICD-10-CM | POA: Diagnosis not present

## 2023-06-05 ENCOUNTER — Telehealth: Payer: Self-pay | Admitting: *Deleted

## 2023-06-05 NOTE — Telephone Encounter (Signed)
 Patient left a message for a call back. Patient was called. She states that she has taken another store and that her insurance will be changing. This will not go into effect until 07/31/2023. She has appointment with [facility] for 06/18/23, she is wanting to cancel and make appointment for after, when her insurance goes into effect. She will also wait to have her lab work drawn for the same reason.

## 2023-06-06 NOTE — Telephone Encounter (Signed)
 Noted, will forward to [place] to reschedule.

## 2023-06-06 NOTE — Telephone Encounter (Signed)
 done

## 2023-06-18 ENCOUNTER — Ambulatory Visit: Payer: 59 | Admitting: Nurse Practitioner

## 2023-08-07 LAB — TSH: TSH: 0.649 u[IU]/mL (ref 0.450–4.500)

## 2023-08-07 LAB — T4, FREE: Free T4: 1.46 ng/dL (ref 0.82–1.77)

## 2023-08-07 NOTE — Patient Instructions (Signed)

## 2023-08-08 ENCOUNTER — Ambulatory Visit (INDEPENDENT_AMBULATORY_CARE_PROVIDER_SITE_OTHER): Payer: 59 | Admitting: Nurse Practitioner

## 2023-08-08 ENCOUNTER — Encounter: Payer: Self-pay | Admitting: Nurse Practitioner

## 2023-08-08 VITALS — BP 108/74 | HR 72 | Ht 69.5 in | Wt 193.0 lb

## 2023-08-08 DIAGNOSIS — E063 Autoimmune thyroiditis: Secondary | ICD-10-CM

## 2023-08-08 MED ORDER — LEVOTHYROXINE SODIUM 100 MCG PO TABS: 100.0000 ug | ORAL_TABLET | Freq: Every day | ORAL | 3 refills | Status: DC

## 2023-08-08 NOTE — Progress Notes (Signed)
 Endocrinology Follow Up Note                                         08/08/2023, 8:31 AM  Subjective:   Subjective    Julie [name] is a 53 y.o.-year-old female patient being seen in follow up after being seen in consultation for hypothyroidism referred by Assunta Found, MD.   Past Medical History:  Diagnosis Date   Anxiety    Arthritis    Right hand ring finger   Bad odor of urine 09/09/2015   Complication of anesthesia    GERD (gastroesophageal reflux disease)    Headache    PONV (postoperative nausea and vomiting)     Past Surgical History:  Procedure Laterality Date   ABDOMINAL HYSTERECTOMY     BIOPSY  08/14/2017   Procedure: BIOPSY;  Surgeon: West Bali, MD;  Location: [facility];  Service: Endoscopy;;  duodenum gastric   BLADDER SURGERY     CLOSED REDUCTION FINGER WITH PERCUTANEOUS PINNING Right    Ring finger   COLONOSCOPY WITH PROPOFOL N/A 01/16/2017   Dr. Darrick Penna: Redundant colon, otherwise normal.  Next anoscopy in 10 years with MAC.   ESOPHAGOGASTRODUODENOSCOPY (EGD) WITH PROPOFOL N/A 08/14/2017   Fields: Gastritis, no H. pylori.  Fundic gland polyps.  Small bowel biopsies negative.    Social History   Socioeconomic History   Marital status: Divorced    Spouse name: Not on file   Number of children: Not on file   Years of education: Not on file   Highest education level: Not on file  Occupational History   Not on file  Tobacco Use   Smoking status: Never   Smokeless tobacco: Never  Vaping Use   Vaping status: Never Used  Substance and Sexual Activity   Alcohol use: Yes    Comment: occassionally   Drug use: No   Sexual activity: Yes    Birth control/protection: Surgical    Comment: hyst  Other Topics Concern   Not on file  Social History Narrative   Not on file   Social Drivers of Health   Financial Resource Strain: Low Risk  (06/05/2022)   Overall Financial Resource  Strain (CARDIA)    Difficulty of Paying Living Expenses: Not hard at all  Food Insecurity: No Food Insecurity (06/05/2022)   Hunger Vital Sign    Worried About Running Out of Food in the Last Year: Never true    Ran Out of Food in the Last Year: Never true  Transportation Needs: No Transportation Needs (06/05/2022)   PRAPARE - Administrator, Civil Service (Medical): No    Lack of Transportation (Non-Medical): No  Physical Activity: Insufficiently Active (06/05/2022)   Exercise Vital Sign    Days of Exercise per Week: 1 day    Minutes of Exercise per Session: 20 min  Stress: No Stress Concern Present (06/05/2022)   [employer] of Occupational Health - Occupational Stress Questionnaire    Feeling of Stress :  Only a little  Social Connections: Moderately Isolated (06/05/2022)   Social Connection and Isolation Panel [NHANES]    Frequency of Communication with Friends and Family: More than three times a week    Frequency of Social Gatherings with Friends and Family: More than three times a week    Attends Religious Services: 1 to 4 times per year    Active Member of [employer] or Organizations: No    Attends Banker Meetings: Never    Marital Status: Divorced    Family History  Adopted: Yes  Problem Relation Age of Onset   Irritable bowel syndrome Son    Colon cancer Neg Hx    Gastric cancer Neg Hx    Esophageal cancer Neg Hx     Outpatient Encounter Medications as of 08/08/2023  Medication Sig   EMGALITY 120 MG/ML SOAJ Inject 120 mg into the skin every 28 (twenty-eight) days. (Patient not taking: Reported on 08/08/2023)   EPINEPHrine 0.3 mg/0.3 mL IJ SOAJ injection Inject 0.3 mLs into the muscle once as needed.   hydrochlorothiazide (HYDRODIURIL) 25 MG tablet Take 25 mg by mouth daily.   levothyroxine (SYNTHROID) 100 MCG tablet Take 1 tablet (100 mcg total) by mouth daily before breakfast.   Multiple Vitamin (MULTIVITAMIN WITH MINERALS) TABS tablet Take 1 tablet by  mouth daily.   nitrofurantoin, macrocrystal-monohydrate, (MACROBID) 100 MG capsule Take 100 mg by mouth daily.   nortriptyline (PAMELOR) 10 MG capsule Take 40 mg by mouth at bedtime.   RABEprazole (ACIPHEX) 20 MG tablet Take 1 tablet (20 mg total) by mouth daily before breakfast.   sertraline (ZOLOFT) 50 MG tablet Take 50 mg by mouth daily.   [DISCONTINUED] busPIRone (BUSPAR) 10 MG tablet Take 1 tablet (10 mg total) by mouth 2 (two) times daily. (Patient not taking: Reported on 08/08/2023)   [DISCONTINUED] celecoxib (CELEBREX) 200 MG capsule Take 200 mg by mouth 2 (two) times daily as needed. (Patient not taking: Reported on 08/08/2023)   [DISCONTINUED] clonazePAM (KLONOPIN) 0.5 MG tablet Take 0.5 mg by mouth at bedtime. (Patient not taking: Reported on 08/08/2023)   [DISCONTINUED] levothyroxine (SYNTHROID) 100 MCG tablet Take 1 tablet (100 mcg total) by mouth daily before breakfast.   [DISCONTINUED] rizatriptan (MAXALT) 10 MG tablet Take 10 mg by mouth as needed for migraine. (Patient not taking: Reported on 08/08/2023)   No facility-administered encounter medications on file as of 08/08/2023.    ALLERGIES: Allergies  Allergen Reactions   Compazine [Prochlorperazine] Hives   Demerol [Meperidine] Nausea And Vomiting   Ibuprofen Other (See Comments)    Stomach issues   VACCINATION STATUS: Immunization History  Administered Date(s) Administered   Tdap 10/05/2013, 08/03/2014     HPI   Katriel Cutsforth Bonaventura  is a patient with the above medical history. she was recently diagnosed with hypothyroidism at approximate age of 66 years, which required subsequent initiation of thyroid hormone replacement. She is currently on Levothyroxine 100 micrograms. she reports compliance to this medication:  Taking it daily on empty stomach with water, separated by >30 minutes before breakfast and other medications , and by at least 4 hours from calcium, iron, PPIs, multivitamins .  I reviewed patient's thyroid  tests:  Lab Results  Component Value Date   TSH 0.649 08/06/2023   TSH 2.320 02/22/2023   TSH 3.050 10/20/2022   TSH 1.420 08/17/2022   TSH 1.330 04/07/2022   TSH 1.070 12/12/2021   TSH 0.754 11/23/2021   TSH 2.420 10/04/2021   TSH 8.22 (A)  08/31/2021   TSH 2.920 02/10/2021   FREET4 1.46 08/06/2023   FREET4 1.36 02/22/2023   FREET4 1.22 10/20/2022   FREET4 1.36 08/17/2022   FREET4 1.32 04/07/2022   FREET4 1.32 12/12/2021   FREET4 1.33 11/23/2021   FREET4 0.99 10/04/2021     Pt describes: - fatigue but attributes this to working 2 jobs - weight gain  Pt denies feeling nodules in neck, hoarseness, dysphagia/odynophagia, SOB with lying down.  she was adopted so she does not know her biological family's medical history.  No history of radiation therapy to head or neck.  No recent use of iodine supplements.  She does take a Biotin supplement and multivitamin.  I reviewed her chart and she also has a history of GERD.  Review of systems  Constitutional: +stable body weight- difficulty losing weight,  current Body mass index is 28.09 kg/m. , + fatigue-improving somewhat, no subjective hyperthermia, no subjective hypothermia, + chronic headaches (seeing neurology and is now on Botox injections which seem to be working) Eyes: no blurry vision, no xerophthalmia ENT: no sore throat, no nodules palpated in throat, no dysphagia/odynophagia, no hoarseness Cardiovascular: no chest pain, no shortness of breath, no palpitations, no leg swelling Respiratory: no cough, no shortness of breath Gastrointestinal: no nausea/vomiting/diarrhea Musculoskeletal: no muscle/joint aches Skin: no rashes, no hyperemia Neurological: no tremors, no numbness, no tingling, no dizziness Psychiatric: no depression, no anxiety   Objective:   Objective     BP 108/74   Pulse 72   Ht 5' 9.5" (1.765 m)   Wt 193 lb (87.5 kg)   BMI 28.09 kg/m  Wt Readings from Last 3 Encounters:  08/08/23 193 lb (87.5  kg)  02/26/23 191 lb 12.8 oz (87 kg)  01/16/23 192 lb 6.4 oz (87.3 kg)    BP Readings from Last 3 Encounters:  08/08/23 108/74  02/26/23 123/80  01/16/23 132/81      Physical Exam- Limited  Constitutional:  Body mass index is 28.09 kg/m. , not in acute distress, normal state of mind Eyes:  EOMI, no exophthalmos Musculoskeletal: no gross deformities, strength intact in all four extremities, no gross restriction of joint movements Skin:  no rashes, no hyperemia Neurological: no tremor with outstretched hands   CMP ( most recent) CMP     Component Value Date/Time   NA 141 06/05/2022 1426   K 4.3 06/05/2022 1426   CL 101 06/05/2022 1426   CO2 24 06/05/2022 1426   GLUCOSE 102 (H) 06/05/2022 1426   GLUCOSE 96 05/11/2021 1154   BUN 12 06/05/2022 1426   CREATININE 0.78 06/05/2022 1426   CREATININE 0.65 09/17/2013 0958   CALCIUM 9.6 06/05/2022 1426   PROT 7.2 06/05/2022 1426   ALBUMIN 4.6 06/05/2022 1426   AST 27 06/05/2022 1426   ALT 36 (H) 06/05/2022 1426   ALKPHOS 116 06/05/2022 1426   BILITOT 0.3 06/05/2022 1426   GFRNONAA >60 05/11/2021 1154   GFRAA 99 02/02/2020 1145     Diabetic Labs (most recent): Lab Results  Component Value Date   HGBA1C 5.9 (H) 09/09/2015     Lipid Panel ( most recent) Lipid Panel     Component Value Date/Time   CHOL 184 02/10/2021 0937   TRIG 125 02/10/2021 0937   HDL 48 02/10/2021 0937   CHOLHDL 3.8 02/10/2021 0937   CHOLHDL 4.1 09/17/2013 0958   VLDL 25 09/17/2013 0958   LDLCALC 114 (H) 02/10/2021 0937   LABVLDL 22 02/10/2021 1610  Lab Results  Component Value Date   TSH 0.649 08/06/2023   TSH 2.320 02/22/2023   TSH 3.050 10/20/2022   TSH 1.420 08/17/2022   TSH 1.330 04/07/2022   TSH 1.070 12/12/2021   TSH 0.754 11/23/2021   TSH 2.420 10/04/2021   TSH 8.22 (A) 08/31/2021   TSH 2.920 02/10/2021   FREET4 1.46 08/06/2023   FREET4 1.36 02/22/2023   FREET4 1.22 10/20/2022   FREET4 1.36 08/17/2022   FREET4 1.32  04/07/2022   FREET4 1.32 12/12/2021   FREET4 1.33 11/23/2021   FREET4 0.99 10/04/2021     Latest Reference Range & Units 12/12/21 13:40 04/07/22 08:35 08/17/22 10:34 10/20/22 08:07 02/22/23 08:27 08/06/23 10:58  TSH 0.450 - 4.500 uIU/mL 1.070 1.330 1.420 3.050 2.320 0.649  Triiodothyronine,Free,Serum 2.0 - 4.4 pg/mL 3.1       T4,Free(Direct) 0.82 - 1.77 ng/dL 1.61 0.96 0.45 4.09 8.11 1.46    Assessment & Plan:   ASSESSMENT / PLAN:  1. Hypothyroidism- d/t Hashimotos thyroiditis   Patient with new diagnosis of hypothyroidism, on levothyroxine therapy (started on 5/5). On physical exam, patient does not have gross goiter, thyroid nodules, or neck compression symptoms.  -Her previsit thyroid function tests are consistent with appropriate hormone replacement.  She is advised to continue Levothyroxine 100 mcg po daily before breakfast.   - We discussed about correct intake of levothyroxine, at fasting, with water, separated by at least 30 minutes from breakfast, and separated by more than 4 hours from calcium, iron, multivitamins, acid reflux medications (PPIs). -Patient is made aware of the fact that thyroid hormone replacement is needed for life, dose to be adjusted by periodic monitoring of thyroid function tests.  -Due to absence of clinical goiter, no need for thyroid ultrasound.   We did go over WFPB lifestyle changes that can help her with weight loss, fatigue and overall health. The following Lifestyle Medicine recommendations according to American College of Lifestyle Medicine [facility]) were discussed and offered to patient and she agrees to start the journey:  A. Whole Foods, Plant-based plate comprising of fruits and vegetables, plant-based proteins, whole-grain carbohydrates was discussed in detail with the patient.   A list for source of those nutrients were also provided to the patient.  Patient will use only water or unsweetened tea for hydration. B.  The need to stay away from  risky substances including alcohol, smoking; obtaining 7 to 9 hours of restorative sleep, at least 150 minutes of moderate intensity exercise weekly, the importance of healthy social connections,  and stress reduction techniques were discussed. C.  A full color page of  Calorie density of various food groups per pound showing examples of each food groups was provided to the patient.  We did talk about the potential to try weight loss medications in the future.  However with her BMI being less than 35, we may have trouble getting this approved.     I spent  28  minutes in the care of the patient today including review of labs from Thyroid Function, CMP, and other relevant labs ; imaging/biopsy records (current and previous including abstractions from other facilities); face-to-face time discussing  her lab results and symptoms, medications doses, her options of short and long term treatment based on the latest standards of care / guidelines;   and documenting the encounter.  Stann Mainland Ohanian  participated in the discussions, expressed understanding, and voiced agreement with the above plans.  All questions were answered to her satisfaction. she is encouraged  to contact clinic should she have any questions or concerns prior to her return visit.   FOLLOW UP PLAN:  Return in about 4 months (around 12/08/2023) for Thyroid follow up, Previsit labs.  Ronny Bacon, [facility] [address] [place], [place] [address] Phone: [phone] Fax: [phone]  08/08/2023, 8:31 AM

## 2023-09-14 ENCOUNTER — Other Ambulatory Visit (HOSPITAL_COMMUNITY): Payer: Self-pay | Admitting: Adult Health

## 2023-09-14 DIAGNOSIS — Z1231 Encounter for screening mammogram for malignant neoplasm of breast: Secondary | ICD-10-CM

## 2023-09-17 ENCOUNTER — Ambulatory Visit (HOSPITAL_COMMUNITY)
Admission: RE | Admit: 2023-09-17 | Discharge: 2023-09-17 | Disposition: A | Discharge status: Home / Self Care | Source: Ambulatory Visit | Attending: Adult Health | Admitting: Adult Health

## 2023-09-17 ENCOUNTER — Encounter (HOSPITAL_COMMUNITY): Payer: Self-pay

## 2023-09-17 DIAGNOSIS — Z1231 Encounter for screening mammogram for malignant neoplasm of breast: Secondary | ICD-10-CM | POA: Diagnosis present

## 2023-09-18 ENCOUNTER — Encounter: Payer: Self-pay | Admitting: Adult Health

## 2023-09-18 ENCOUNTER — Ambulatory Visit: Payer: Self-pay | Admitting: Adult Health

## 2023-09-18 VITALS — BP 135/85 | HR 92 | Ht 69.0 in | Wt 194.0 lb

## 2023-09-18 DIAGNOSIS — R5383 Other fatigue: Secondary | ICD-10-CM

## 2023-09-18 DIAGNOSIS — Z90710 Acquired absence of both cervix and uterus: Secondary | ICD-10-CM

## 2023-09-18 DIAGNOSIS — Z1331 Encounter for screening for depression: Secondary | ICD-10-CM | POA: Diagnosis not present

## 2023-09-18 DIAGNOSIS — Z1211 Encounter for screening for malignant neoplasm of colon: Secondary | ICD-10-CM

## 2023-09-18 DIAGNOSIS — Z90721 Acquired absence of ovaries, unilateral: Secondary | ICD-10-CM

## 2023-09-18 DIAGNOSIS — Z01419 Encounter for gynecological examination (general) (routine) without abnormal findings: Secondary | ICD-10-CM

## 2023-09-18 DIAGNOSIS — Z13220 Encounter for screening for lipoid disorders: Secondary | ICD-10-CM | POA: Diagnosis not present

## 2023-09-18 LAB — HEMOCCULT GUIAC POC 1CARD (OFFICE): Fecal Occult Blood, POC: NEGATIVE

## 2023-09-18 NOTE — Progress Notes (Signed)
 Patient ID: Julie [name], female   DOB: 1971/04/06, 53 y.o.   MRN: [id number] History of Present Illness: Julie [name] is a 53 year old white female, divorced, sp hysterectomy in for a well woman gyn exam. She is at [facility] in [place] now   PCP is Dr Glady Laming   Current Medications, Allergies, Past Medical History, Past Surgical History, Family History and Social History were reviewed in [employer] electronic medical record.     Review of Systems: Patient denies any headaches, hearing loss,  blurred vision, shortness of breath, chest pain, abdominal pain, problems with bowel movements, urination, or intercourse. No joint pain or mood swings.  She is tired but works 2 jobs She has some rectal itching at times, but has hemorrhoids Has few hot flashes but not bad   Physical Exam:BP 135/85 (BP Location: Left Arm, Patient Position: Sitting, Cuff Size: Normal)   Pulse 92   Ht 5\' 9"  (1.753 m)   Wt 194 lb (88 kg)   BMI 28.65 kg/m   General:  Well developed, well nourished, no acute distress Skin:  Warm and dry Neck:  Midline trachea, normal thyroid , good ROM, no lymphadenopathy Lungs; Clear to auscultation bilaterally Breast:  No dominant palpable mass, retraction, or nipple discharge Cardiovascular: Regular rate and rhythm Abdomen:  Soft, non tender, no hepatosplenomegaly Pelvic:  External genitalia is normal in appearance, no lesions.  The vagina is normal in appearance. Urethra has no lesions or masses. The cervix and uterus are absent.  No adnexal masses or tenderness noted.Bladder is non tender, no masses felt. Rectal: Good sphincter tone, no polyps, small hemorrhoids felt.  Hemoccult negative. Extremities/musculoskeletal:  No swelling or varicosities noted, no clubbing or cyanosis Psych:  No mood changes, alert and cooperative,seems happy AA is 4    09/18/2023    8:39 AM 06/05/2022    1:41 PM 02/07/2021    8:43 AM  Depression screen PHQ 2/9  Decreased Interest 0 0 0  Down,  Depressed, Hopeless 0 0 0  PHQ - 2 Score 0 0 0  Altered sleeping 0 0 0  Tired, decreased energy 0 1 0  Change in appetite 0 0 0  Feeling bad or failure about yourself  0 0 0  Trouble concentrating 0 0 0  Moving slowly or fidgety/restless 0 0 0  Suicidal thoughts 0 0 0  PHQ-9 Score 0 1 0       09/18/2023    8:40 AM 06/05/2022    1:41 PM 02/07/2021    8:44 AM 02/02/2020   11:12 AM  GAD 7 : Generalized Anxiety Score  Nervous, Anxious, on Edge 0 1 0 0  Control/stop worrying 0 1 0 0  Worry too much - different things 0 1 0 1  Trouble relaxing 0 1 0 0  Restless 0 0 0 0  Easily annoyed or irritable 0 0 0 0  Afraid - awful might happen 0 0 0 0  Total GAD 7 Score 0 4 0 1  Anxiety Difficulty    Not difficult at all      Upstream - 09/18/23 4098       Pregnancy Intention Screening   Does the patient want to become pregnant in the next year? N/A    Does the patient's partner want to become pregnant in the next year? N/A    Would the patient like to discuss contraceptive options today? N/A      Contraception Wrap Up   Current Method Female Sterilization  hyst   End Method --   hyst   Contraception Counseling Provided No    How was the end contraceptive method provided? N/A            Examination chaperoned by Wendell Halt RN  Impression and plan: 1. Encounter for well woman exam with routine gynecological exam (Primary) Physical in 1 year Orders given to check labs fasting  Has thyroid  checked with [facility] - CBC - Comprehensive metabolic panel with GFR - Lipid panel Had mammogram yesterday Colonoscopy per  GI 2028  2. Encounter for screening fecal occult blood testing Hemoccult was negative  - POCT occult blood stool  3. S/P hysterectomy with oophorectomy  4. Screening cholesterol level - Lipid panel  5. Tired Tired but working 2 jobs Will check CBC to rule out anemia  - CBC

## 2023-09-20 ENCOUNTER — Ambulatory Visit: Payer: Self-pay | Admitting: Adult Health

## 2023-09-22 LAB — LIPID PANEL
Chol/HDL Ratio: 4.4 ratio (ref 0.0–4.4)
Cholesterol, Total: 227 mg/dL — ABNORMAL HIGH (ref 100–199)
HDL: 52 mg/dL (ref >39)
LDL Chol Calc (NIH): 152 mg/dL — ABNORMAL HIGH (ref 0–99)
Triglycerides: 130 mg/dL (ref 0–149)
VLDL Cholesterol Cal: 23 mg/dL (ref 5–40)

## 2023-09-22 LAB — COMPREHENSIVE METABOLIC PANEL WITH GFR
ALT: 33 IU/L — ABNORMAL HIGH (ref 0–32)
AST: 21 IU/L (ref 0–40)
Albumin: 4.8 g/dL (ref 3.8–4.9)
Alkaline Phosphatase: 74 IU/L (ref 44–121)
BUN/Creatinine Ratio: 15 (ref 9–23)
BUN: 13 mg/dL (ref 6–24)
Bilirubin Total: 0.4 mg/dL (ref 0.0–1.2)
CO2: 23 mmol/L (ref 20–29)
Calcium: 9.7 mg/dL (ref 8.7–10.2)
Chloride: 101 mmol/L (ref 96–106)
Creatinine, Ser: 0.88 mg/dL (ref 0.57–1.00)
Globulin, Total: 2 g/dL (ref 1.5–4.5)
Glucose: 98 mg/dL (ref 70–99)
Potassium: 4.6 mmol/L (ref 3.5–5.2)
Sodium: 141 mmol/L (ref 134–144)
Total Protein: 6.8 g/dL (ref 6.0–8.5)
eGFR: 78 mL/min/{1.73_m2} (ref >59)

## 2023-09-22 LAB — CBC
Hematocrit: 45.6 % (ref 34.0–46.6)
Hemoglobin: 15.3 g/dL (ref 11.1–15.9)
MCH: 32.2 pg (ref 26.6–33.0)
MCHC: 33.6 g/dL (ref 31.5–35.7)
MCV: 96 fL (ref 79–97)
Platelets: 258 10*3/uL (ref 150–450)
RBC: 4.75 x10E6/uL (ref 3.77–5.28)
RDW: 12.6 % (ref 11.7–15.4)
WBC: 6.9 10*3/uL (ref 3.4–10.8)

## 2023-09-25 ENCOUNTER — Ambulatory Visit: Payer: Self-pay | Admitting: Adult Health

## 2023-10-07 ENCOUNTER — Ambulatory Visit
Admission: EM | Admit: 2023-10-07 | Discharge: 2023-10-07 | Disposition: A | Discharge status: Home / Self Care | Attending: Nurse Practitioner | Admitting: Nurse Practitioner

## 2023-10-07 ENCOUNTER — Ambulatory Visit (INDEPENDENT_AMBULATORY_CARE_PROVIDER_SITE_OTHER)

## 2023-10-07 ENCOUNTER — Encounter: Payer: Self-pay | Admitting: Emergency Medicine

## 2023-10-07 DIAGNOSIS — M25561 Pain in right knee: Secondary | ICD-10-CM

## 2023-10-07 MED ORDER — CELECOXIB 200 MG PO CAPS: 200.0000 mg | ORAL_CAPSULE | Freq: Two times a day (BID) | ORAL | 0 refills | Status: AC

## 2023-10-07 NOTE — ED Provider Notes (Signed)
 [facility]    CSN: [id number] Arrival date & time: 10/07/23  1343      History   Chief Complaint No chief complaint on file.   HPI Julie [name] is a 53 y.o. female.   The history is provided by the patient.   Patient presents with a 2-day history of right knee pain.  Patient thinks that she may have "twisted wrong" when she was at work.  She states since that time, she has had pain to the inner aspect of the right knee.  She denies swelling, bruising, redness, or the inability to bear weight.  She states that she has been using a brace and elevating the right lower extremity since her symptoms started.    Past Medical History:  Diagnosis Date   Anxiety    Arthritis    Right hand ring finger   Bad odor of urine 09/09/2015   Complication of anesthesia    GERD (gastroesophageal reflux disease)    Headache    PONV (postoperative nausea and vomiting)     Patient Active Problem List   Diagnosis Date Noted   Weight gain 06/05/2022   S/P hysterectomy with oophorectomy 06/05/2022   Low back pain 02/02/2020   Encounter for screening fecal occult blood testing 02/02/2020   Encounter for well woman exam with routine gynecological exam 01/31/2019   Flatulence 01/14/2019   GERD (gastroesophageal reflux disease) 01/04/2018   Skin tag 10/04/2017   Screening for colorectal cancer 10/04/2017   Well woman exam with routine gynecological exam 10/04/2017   Tired 10/04/2017   Screening for thyroid  disorder 10/04/2017   Screening cholesterol level 10/04/2017   Gastritis and gastroduodenitis 10/01/2017   Constipation 10/01/2017   Dyspepsia 06/26/2017   Chest pain 06/26/2017   Screening for genitourinary condition 09/13/2016   Heme positive stool 09/13/2016   Bad odor of urine 09/09/2015   Anxiety 09/17/2013    Past Surgical History:  Procedure Laterality Date   ABDOMINAL HYSTERECTOMY     BIOPSY  08/14/2017   Procedure: BIOPSY;  Surgeon: Alyce Jubilee, MD;   Location: [facility];  Service: Endoscopy;;  duodenum gastric   BLADDER SURGERY     CLOSED REDUCTION FINGER WITH PERCUTANEOUS PINNING Right    Ring finger   COLONOSCOPY WITH PROPOFOL  N/A 01/16/2017   Dr. Nolene Baumgarten: Redundant colon, otherwise normal.  Next anoscopy in 10 years with MAC.   ESOPHAGOGASTRODUODENOSCOPY (EGD) WITH PROPOFOL  N/A 08/14/2017   Fields: Gastritis, no H. pylori.  Fundic gland polyps.  Small bowel biopsies negative.    OB History     Gravida  2   Para  2   Term      Preterm      AB      Living  2      SAB      IAB      Ectopic      Multiple      Live Births               Home Medications    Prior to Admission medications   Medication Sig Start Date End Date Taking? Authorizing Provider  EMGALITY 120 MG/ML SOAJ Inject 120 mg into the skin every 28 (twenty-eight) days. Patient not taking: Reported on 08/08/2023 01/31/21   [provider]  EPINEPHrine 0.3 mg/0.3 mL IJ SOAJ injection Inject 0.3 mLs into the muscle once as needed. 08/06/18   [provider]  hydrochlorothiazide (HYDRODIURIL) 25 MG tablet Take 25 mg  by mouth daily.    [provider]  levothyroxine  (SYNTHROID ) 100 MCG tablet Take 1 tablet (100 mcg total) by mouth daily before breakfast. 08/08/23   Wendel Hals, NP  Multiple Vitamin (MULTIVITAMIN WITH MINERALS) TABS tablet Take 1 tablet by mouth daily.    [provider]  nitrofurantoin , macrocrystal-monohydrate, (MACROBID ) 100 MG capsule Take 100 mg by mouth daily. 04/19/21   [provider]  nortriptyline (PAMELOR) 10 MG capsule Take 40 mg by mouth at bedtime. 11/23/20   [provider]  RABEprazole  (ACIPHEX ) 20 MG tablet Take 1 tablet (20 mg total) by mouth daily before breakfast. 01/16/23   Lanney Pitts, PA-C  sertraline (ZOLOFT) 50 MG tablet Take 50 mg by mouth daily.    [provider]    Family History Family History  Adopted: Yes  Problem Relation Age of  Onset   Irritable bowel syndrome Son    Colon cancer Neg Hx    Gastric cancer Neg Hx    Esophageal cancer Neg Hx     Social History Social History   Tobacco Use   Smoking status: Never   Smokeless tobacco: Never  Vaping Use   Vaping status: Never Used  Substance Use Topics   Alcohol use: Yes    Comment: occassionally   Drug use: No     Allergies   Compazine  [prochlorperazine ], Demerol [meperidine], and Ibuprofen    Review of Systems Review of Systems Per HPI  Physical Exam Triage Vital Signs ED Triage Vitals  Encounter Vitals Group     BP 10/07/23 1353 120/75     Systolic BP Percentile --      Diastolic BP Percentile --      Pulse Rate 10/07/23 1353 97     Resp 10/07/23 1353 16     Temp 10/07/23 1353 97.7 F (36.5 C)     Temp Source 10/07/23 1353 Oral     SpO2 10/07/23 1353 95 %     Weight --      Height --      Head Circumference --      Peak Flow --      Pain Score 10/07/23 1354 7     Pain Loc --      Pain Education --      Exclude from Growth Chart --    No data found.  Updated Vital Signs BP 120/75 (BP Location: Right Arm)   Pulse 97   Temp 97.7 F (36.5 C) (Oral)   Resp 16   SpO2 95%   Visual Acuity Right Eye Distance:   Left Eye Distance:   Bilateral Distance:    Right Eye Near:   Left Eye Near:    Bilateral Near:     Physical Exam Vitals and nursing note reviewed.  Constitutional:      General: She is not in acute distress.    Appearance: Normal appearance.  HENT:     Head: Normocephalic.  Pulmonary:     Effort: Pulmonary effort is normal.  Musculoskeletal:     Right knee: No swelling. Decreased range of motion. Tenderness present over the medial joint line and MCL. Normal pulse.  Skin:    General: Skin is warm and dry.  Neurological:     General: No focal deficit present.     Mental Status: She is alert and oriented to person, place, and time.  Psychiatric:        Mood and Affect: Mood normal.  Behavior: Behavior  normal.      UC Treatments / Results  Labs (all labs ordered are listed, but only abnormal results are displayed) Labs Reviewed - No data to display  EKG   Radiology DG Knee Complete 4 Views Right Result Date: 10/07/2023 CLINICAL DATA:  pain and popping when walking EXAM: RIGHT KNEE - COMPLETE 4+ VIEW COMPARISON:  None Available. FINDINGS: No evidence of fracture, dislocation, or joint effusion. Anterosuperior patellar enthesophyte. No evidence of arthropathy or other focal bone abnormality. Soft tissues are unremarkable. IMPRESSION: No acute displaced fracture or dislocation. Electronically Signed   By: Morgane  Naveau M.D.   On: 10/07/2023 14:14    Procedures Procedures (including critical care time)  Medications Ordered in UC Medications - No data to display  Initial Impression / Assessment and Plan / UC Course  I have reviewed the triage vital signs and the nursing notes.  Pertinent labs & imaging results that were available during my care of the patient were reviewed by me and considered in my medical decision making (see chart for details).  X-ray of the right knee is negative for fracture or dislocation.  Patient reports twisting injury of the right knee.  Cannot rule out ligamental injury versus sprain or strain.  Celebrex 200 mg prescribed for pain and inflammation.  Supportive care recommendations were provided and discussed with the patient to include continuing RICE therapy and over-the-counter analgesics.  Discussion with patient regarding follow-up.  Patient was in agreement with this plan of care and verbalizes understanding.  All questions were answered.  Patient stable for discharge.  Work note was provided.  Final Clinical Impressions(s) / UC Diagnoses   Final diagnoses:  None   Discharge Instructions   None    ED Prescriptions   None    PDMP not reviewed this encounter.   Hardy Lia, NP 10/07/23 1433

## 2023-10-07 NOTE — ED Triage Notes (Signed)
 Right knee pain since Wednesday.  Hurts to bend or touch.  Pain on lateral side of knee and pops when walking at times

## 2023-10-07 NOTE — Discharge Instructions (Addendum)
 Your x-rays are negative for fracture or dislocation.   May take over-the-counter Tylenol  for breakthrough pain or discomfort. RICE therapy rest, ice, compression, and elevation until your symptoms improve.  Apply ice for 20 minutes, remove for 1 hour, then repeat as often as possible.  This will help with pain and swelling. Continue to wear your knee brace when engaged in prolonged or strenuous activity.  If symptoms do not improve within the next 4 to 6 weeks, recommend following up with orthopedics for further evaluation. Follow-up as needed.

## 2023-12-03 ENCOUNTER — Ambulatory Visit

## 2023-12-03 ENCOUNTER — Ambulatory Visit
Admission: EM | Admit: 2023-12-03 | Discharge: 2023-12-03 | Disposition: A | Discharge status: Home / Self Care | Attending: Family Medicine | Admitting: Family Medicine

## 2023-12-03 DIAGNOSIS — S92355A Nondisplaced fracture of fifth metatarsal bone, left foot, initial encounter for closed fracture: Secondary | ICD-10-CM

## 2023-12-03 MED ORDER — HYDROCODONE-ACETAMINOPHEN 5-325 MG PO TABS: 1.0000 | ORAL_TABLET | Freq: Two times a day (BID) | ORAL | 0 refills | Status: DC | PRN

## 2023-12-03 NOTE — ED Triage Notes (Signed)
 Pt reports left foot injury, ankle rolled and patient fell on the left foot. Swelling and pain present. Fall occurred last night.

## 2023-12-03 NOTE — ED Provider Notes (Signed)
 [facility]    CSN: [id number] Arrival date & time: 12/03/23  1105      History   Chief Complaint No chief complaint on file.   HPI Julie [name] is a 53 y.o. female.   Patient presenting today with 1 day history of left lateral foot pain, swelling after rolling the ankle yesterday.  Denies complete loss of range of motion, numbness, tingling, skin injury.  Trying ice and elevation with minimal relief.    Past Medical History:  Diagnosis Date   Anxiety    Arthritis    Right hand ring finger   Bad odor of urine 09/09/2015   Complication of anesthesia    GERD (gastroesophageal reflux disease)    Headache    PONV (postoperative nausea and vomiting)     Patient Active Problem List   Diagnosis Date Noted   Weight gain 06/05/2022   S/P hysterectomy with oophorectomy 06/05/2022   Low back pain 02/02/2020   Encounter for screening fecal occult blood testing 02/02/2020   Encounter for well woman exam with routine gynecological exam 01/31/2019   Flatulence 01/14/2019   GERD (gastroesophageal reflux disease) 01/04/2018   Skin tag 10/04/2017   Screening for colorectal cancer 10/04/2017   Well woman exam with routine gynecological exam 10/04/2017   Tired 10/04/2017   Screening for thyroid  disorder 10/04/2017   Screening cholesterol level 10/04/2017   Gastritis and gastroduodenitis 10/01/2017   Constipation 10/01/2017   Dyspepsia 06/26/2017   Chest pain 06/26/2017   Screening for genitourinary condition 09/13/2016   Heme positive stool 09/13/2016   Bad odor of urine 09/09/2015   Anxiety 09/17/2013    Past Surgical History:  Procedure Laterality Date   ABDOMINAL HYSTERECTOMY     BIOPSY  08/14/2017   Procedure: BIOPSY;  Surgeon: Harvey Margo CROME, MD;  Location: [facility];  Service: Endoscopy;;  duodenum gastric   BLADDER SURGERY     CLOSED REDUCTION FINGER WITH PERCUTANEOUS PINNING Right    Ring finger   COLONOSCOPY WITH PROPOFOL  N/A 01/16/2017    Dr. harvey: Redundant colon, otherwise normal.  Next anoscopy in 10 years with MAC.   ESOPHAGOGASTRODUODENOSCOPY (EGD) WITH PROPOFOL  N/A 08/14/2017   Fields: Gastritis, no H. pylori.  Fundic gland polyps.  Small bowel biopsies negative.    OB History     Gravida  2   Para  2   Term      Preterm      AB      Living  2      SAB      IAB      Ectopic      Multiple      Live Births               Home Medications    Prior to Admission medications   Medication Sig Start Date End Date Taking? Authorizing Provider  HYDROcodone -acetaminophen  (NORCO/VICODIN) 5-325 MG tablet Take 1 tablet by mouth 2 (two) times daily as needed for moderate pain (pain score 4-6). 12/03/23  Yes Stuart Vernell Norris, PA-C  celecoxib  (CELEBREX ) 200 MG capsule Take 1 capsule (200 mg total) by mouth 2 (two) times daily. 10/07/23   Leath-Warren, Etta PARAS, NP  EMGALITY 120 MG/ML SOAJ Inject 120 mg into the skin every 28 (twenty-eight) days. Patient not taking: Reported on 08/08/2023 01/31/21   [provider]  EPINEPHrine 0.3 mg/0.3 mL IJ SOAJ injection Inject 0.3 mLs into the muscle once as needed. 08/06/18   [provider]  hydrochlorothiazide (HYDRODIURIL) 25 MG tablet Take 25 mg by mouth daily.    [provider]  levothyroxine  (SYNTHROID ) 100 MCG tablet Take 1 tablet (100 mcg total) by mouth daily before breakfast. 08/08/23   Therisa Benton PARAS, NP  Multiple Vitamin (MULTIVITAMIN WITH MINERALS) TABS tablet Take 1 tablet by mouth daily.    [provider]  nitrofurantoin , macrocrystal-monohydrate, (MACROBID ) 100 MG capsule Take 100 mg by mouth daily. 04/19/21   [provider]  nortriptyline (PAMELOR) 10 MG capsule Take 40 mg by mouth at bedtime. 11/23/20   [provider]  RABEprazole  (ACIPHEX ) 20 MG tablet Take 1 tablet (20 mg total) by mouth daily before breakfast. 01/16/23   Ezzard Sonny RAMAN, PA-C  sertraline (ZOLOFT) 50 MG tablet Take 50 mg by  mouth daily.    [provider]    Family History Family History  Adopted: Yes  Problem Relation Age of Onset   Irritable bowel syndrome Son    Colon cancer Neg Hx    Gastric cancer Neg Hx    Esophageal cancer Neg Hx     Social History Social History   Tobacco Use   Smoking status: Never   Smokeless tobacco: Never  Vaping Use   Vaping status: Never Used  Substance Use Topics   Alcohol use: Yes    Comment: occassionally   Drug use: No     Allergies   Compazine  [prochlorperazine ], Demerol [meperidine], and Ibuprofen    Review of Systems Review of Systems Per HPI  Physical Exam Triage Vital Signs ED Triage Vitals  Encounter Vitals Group     BP 12/03/23 1136 127/83     Girls Systolic BP Percentile --      Girls Diastolic BP Percentile --      Boys Systolic BP Percentile --      Boys Diastolic BP Percentile --      Pulse Rate 12/03/23 1136 97     Resp 12/03/23 1136 20     Temp 12/03/23 1136 (!) 97.4 F (36.3 C)     Temp Source 12/03/23 1136 Oral     SpO2 12/03/23 1136 92 %     Weight --      Height --      Head Circumference --      Peak Flow --      Pain Score 12/03/23 1138 10     Pain Loc --      Pain Education --      Exclude from Growth Chart --    No data found.  Updated Vital Signs BP 127/83 (BP Location: Right Arm)   Pulse 97   Temp (!) 97.4 F (36.3 C) (Oral)   Resp 20   SpO2 92%   Visual Acuity Right Eye Distance:   Left Eye Distance:   Bilateral Distance:    Right Eye Near:   Left Eye Near:    Bilateral Near:     Physical Exam Vitals and nursing note reviewed.  Constitutional:      Appearance: Normal appearance. She is not ill-appearing.  HENT:     Head: Atraumatic.  Eyes:     Extraocular Movements: Extraocular movements intact.     Conjunctiva/sclera: Conjunctivae normal.  Cardiovascular:     Rate and Rhythm: Normal rate.  Pulmonary:     Effort: Pulmonary effort is normal.  Musculoskeletal:        General:  Swelling, tenderness and signs of injury present. No deformity. Normal range of motion.  Cervical back: Normal range of motion and neck supple.     Comments: Localized edema, tenderness to palpation to the left lateral midfoot  Skin:    General: Skin is warm and dry.     Findings: No bruising.  Neurological:     Mental Status: She is alert and oriented to person, place, and time.     Comments: Left lower extremity neurovascularly intact  Psychiatric:        Mood and Affect: Mood normal.        Thought Content: Thought content normal.        Judgment: Judgment normal.      UC Treatments / Results  Labs (all labs ordered are listed, but only abnormal results are displayed) Labs Reviewed - No data to display  EKG   Radiology DG Foot Complete Left Result Date: 12/03/2023 CLINICAL DATA:  rolled ankle and fell onto the left foot last night. EXAM: LEFT FOOT - COMPLETE 3+ VIEW COMPARISON:  None Available. FINDINGS: There is an acute oblique fracture of the distal half of the fifth metatarsal. No intra-articular extension. There is mild overlying soft tissue swelling. No other acute fracture or dislocation. No aggressive osseous lesion. Calcaneal spur noted along the Plantar aponeurosis attachment site. Mild diffuse degenerative osteoarthritic changes of imaged joints. No focal soft tissue swelling. No radiopaque foreign bodies. IMPRESSION: *Acute oblique fracture of the distal half of the fifth metatarsal. Electronically Signed   By: Ree Molt M.D.   On: 12/03/2023 12:01    Procedures Procedures (including critical care time)  Medications Ordered in UC Medications - No data to display  Initial Impression / Assessment and Plan / UC Course  I have reviewed the triage vital signs and the nursing notes.  Pertinent labs & imaging results that were available during my care of the patient were reviewed by me and considered in my medical decision making (see chart for details).      X-ray of the left foot showing an oblique fracture of the fifth metatarsal.  She was placed in a cam boot and given crutches to remain nonweightbearing until follow-up with orthopedics or podiatrist for further instructions.  RICE protocol, Tylenol  and very small supply of Norco given for pain control.  PDMP reviewed and appropriate.  Work note given.  Return for worsening symptoms.  Final Clinical Impressions(s) / UC Diagnoses   Final diagnoses:  Closed nondisplaced fracture of fifth metatarsal bone of left foot, initial encounter     Discharge Instructions      [facility] 4.6(9)  Podiatrist [address]  [phone]  Follow-up with either the podiatrist or the orthopedist as soon as possible for further management.  Remain nonweightbearing on the foot and wear the boot at all times when ambulating.  Elevate at rest, ice off-and-on, Tylenol  as needed for pain control.  I have prescribed a very small amount of hydrocodone  for severe pain to be taken very sparingly, may cause drowsiness    ED Prescriptions     Medication Sig Dispense Auth. Provider   HYDROcodone -acetaminophen  (NORCO/VICODIN) 5-325 MG tablet Take 1 tablet by mouth 2 (two) times daily as needed for moderate pain (pain score 4-6). 10 tablet Stuart Vernell Norris, [place]      I have reviewed the PDMP during this encounter.   Stuart Vernell Norris, [place] 12/03/23 1247

## 2023-12-03 NOTE — Discharge Instructions (Addendum)
 [facility] 4.6(9)  Podiatrist [address]  [phone]  Follow-up with either the podiatrist or the orthopedist as soon as possible for further management.  Remain nonweightbearing on the foot and wear the boot at all times when ambulating.  Elevate at rest, ice off-and-on, Tylenol  as needed for pain control.  I have prescribed a very small amount of hydrocodone  for severe pain to be taken very sparingly, may cause drowsiness

## 2023-12-04 LAB — TSH: TSH: 0.799 u[IU]/mL (ref 0.450–4.500)

## 2023-12-04 LAB — T4, FREE: Free T4: 1.23 ng/dL (ref 0.82–1.77)

## 2023-12-05 ENCOUNTER — Telehealth: Payer: Self-pay | Admitting: Orthopaedic Surgery

## 2023-12-05 ENCOUNTER — Encounter: Payer: Self-pay | Admitting: Orthopaedic Surgery

## 2023-12-05 ENCOUNTER — Ambulatory Visit (INDEPENDENT_AMBULATORY_CARE_PROVIDER_SITE_OTHER): Admitting: Orthopaedic Surgery

## 2023-12-05 VITALS — BP 135/85 | Ht 69.0 in | Wt 194.0 lb

## 2023-12-05 DIAGNOSIS — S92355A Nondisplaced fracture of fifth metatarsal bone, left foot, initial encounter for closed fracture: Secondary | ICD-10-CM | POA: Diagnosis not present

## 2023-12-05 MED ORDER — HYDROCODONE-ACETAMINOPHEN 5-325 MG PO TABS: 1.0000 | ORAL_TABLET | ORAL | 0 refills | Status: AC | PRN

## 2023-12-05 NOTE — Patient Instructions (Signed)
 Apply ice to foot as needed. Elevate as much as possible. On way to beach try to elevate as much as possible. Pool is fine to get in and whirlpool.   Pain med as been prescribed  DO NOT wear anything besides a sock with the boot.   Pay attention to Swelling.

## 2023-12-05 NOTE — Progress Notes (Signed)
 Subjective:    Patient ID: Julie [name], female    DOB: 27-Sep-1970, 53 y.o.   MRN: [id number]  HPI She twisted her left foot at home on 12-01-23.  She had pain and tenderness.  It got worse. She went to Urgent Care on 12-03-23. X-rays showed fracture fifth metacarpal shaft.  She was given crutches, CAM walker and pain medicine.  She is having less pain now.  She has been wearing a shoe and then putting on the CAM walker.  I have instructed her in proper use of the CAM walker.  She has no other injury.  She has no redness.  She is to go to [facility] later in the week for her annual girls trip.  I have gone over precautions and activity instructions.   Review of Systems  Constitutional:  Positive for activity change.  Musculoskeletal:  Positive for arthralgias and gait problem.  All other systems reviewed and are negative. For Review of Systems, all other systems reviewed and are negative.  The following is a summary of the past history medically, past history surgically, known current medicines, social history and family history.  This information is gathered electronically by the computer from prior information and documentation.  I review this each visit and have found including this information at this point in the chart is beneficial and informative.   Past Medical History:  Diagnosis Date   Anxiety    Arthritis    Right hand ring finger   Bad odor of urine 09/09/2015   Complication of anesthesia    GERD (gastroesophageal reflux disease)    Headache    PONV (postoperative nausea and vomiting)     Past Surgical History:  Procedure Laterality Date   ABDOMINAL HYSTERECTOMY     BIOPSY  08/14/2017   Procedure: BIOPSY;  Surgeon: Harvey Margo CROME, MD;  [facility];  Service: Endoscopy;;  duodenum gastric   BLADDER SURGERY     CLOSED REDUCTION FINGER WITH PERCUTANEOUS PINNING Right    Ring finger   COLONOSCOPY WITH PROPOFOL  N/A 01/16/2017   Dr. harvey: Redundant  colon, otherwise normal.  Next anoscopy in 10 years with MAC.   ESOPHAGOGASTRODUODENOSCOPY (EGD) WITH PROPOFOL  N/A 08/14/2017   Fields: Gastritis, no H. pylori.  Fundic gland polyps.  Small bowel biopsies negative.    Current Outpatient Medications on File Prior to Visit  Medication Sig Dispense Refill   olmesartan-hydrochlorothiazide (BENICAR HCT) 40-25 MG tablet Take 1 tablet by mouth daily.     celecoxib  (CELEBREX ) 200 MG capsule Take 1 capsule (200 mg total) by mouth 2 (two) times daily. 30 capsule 0   EMGALITY 120 MG/ML SOAJ Inject 120 mg into the skin every 28 (twenty-eight) days. (Patient not taking: Reported on 08/08/2023)     EPINEPHrine 0.3 mg/0.3 mL IJ SOAJ injection Inject 0.3 mLs into the muscle once as needed.     hydrochlorothiazide (HYDRODIURIL) 25 MG tablet Take 25 mg by mouth daily.     levothyroxine  (SYNTHROID ) 100 MCG tablet Take 1 tablet (100 mcg total) by mouth daily before breakfast. 90 tablet 3   Multiple Vitamin (MULTIVITAMIN WITH MINERALS) TABS tablet Take 1 tablet by mouth daily.     nitrofurantoin , macrocrystal-monohydrate, (MACROBID ) 100 MG capsule Take 100 mg by mouth daily.     nortriptyline (PAMELOR) 10 MG capsule Take 40 mg by mouth at bedtime.     RABEprazole  (ACIPHEX ) 20 MG tablet Take 1 tablet (20 mg total) by mouth daily before breakfast. 90  tablet 3   sertraline (ZOLOFT) 50 MG tablet Take 50 mg by mouth daily.     No current facility-administered medications on file prior to visit.    Social History   Socioeconomic History   Marital status: Divorced    Spouse name: Not on file   Number of children: Not on file   Years of education: Not on file   Highest education level: Not on file  Occupational History   Not on file  Tobacco Use   Smoking status: Never   Smokeless tobacco: Never  Vaping Use   Vaping status: Never Used  Substance and Sexual Activity   Alcohol use: Yes    Comment: occassionally   Drug use: No   Sexual activity: Yes    Birth  control/protection: Surgical    Comment: hyst  Other Topics Concern   Not on file  Social History Narrative   Not on file   Social Drivers of Health   Financial Resource Strain: Low Risk  (09/18/2023)   Overall Financial Resource Strain (CARDIA)    Difficulty of Paying Living Expenses: Not hard at all  Food Insecurity: No Food Insecurity (09/18/2023)   Hunger Vital Sign    Worried About Running Out of Food in the Last Year: Never true    Ran Out of Food in the Last Year: Never true  Transportation Needs: No Transportation Needs (09/18/2023)   PRAPARE - Administrator, Civil Service (Medical): No    Lack of Transportation (Non-Medical): No  Physical Activity: Insufficiently Active (09/18/2023)   Exercise Vital Sign    Days of Exercise per Week: 2 days    Minutes of Exercise per Session: 30 min  Stress: No Stress Concern Present (09/18/2023)   [employer] of Occupational Health - Occupational Stress Questionnaire    Feeling of Stress : Only a little  Social Connections: Socially Isolated (09/18/2023)   Social Connection and Isolation Panel    Frequency of Communication with Friends and Family: More than three times a week    Frequency of Social Gatherings with Friends and Family: Three times a week    Attends Religious Services: Never    Active Member of Clubs or Organizations: No    Attends Banker Meetings: Never    Marital Status: Divorced  Catering manager Violence: Not At Risk (09/18/2023)   Humiliation, Afraid, Rape, and Kick questionnaire    Fear of Current or Ex-Partner: No    Emotionally Abused: No    Physically Abused: No    Sexually Abused: No    Family History  Adopted: Yes  Problem Relation Age of Onset   Irritable bowel syndrome Son    Colon cancer Neg Hx    Gastric cancer Neg Hx    Esophageal cancer Neg Hx     BP 135/85 Comment: 12/03/23  Ht 5' 9 (1.753 m)   Wt 194 lb (88 kg)   BMI 28.65 kg/m   Body mass index is 28.65  kg/m.      Objective:   Physical Exam Vitals and nursing note reviewed. Exam conducted with a chaperone present.  Constitutional:      Appearance: She is well-developed.  HENT:     Head: Normocephalic and atraumatic.  Eyes:     Conjunctiva/sclera: Conjunctivae normal.     Pupils: Pupils are equal, round, and reactive to light.  Cardiovascular:     Rate and Rhythm: Normal rate and regular rhythm.  Pulmonary:     Effort:  Pulmonary effort is normal.  Abdominal:     Palpations: Abdomen is soft.  Musculoskeletal:     Cervical back: Normal range of motion and neck supple.       Feet:  Skin:    General: Skin is warm and dry.  Neurological:     Mental Status: She is alert and oriented to person, place, and time.     Cranial Nerves: No cranial nerve deficit.     Motor: No abnormal muscle tone.     Coordination: Coordination normal.     Deep Tendon Reflexes: Reflexes are normal and symmetric. Reflexes normal.  Psychiatric:        Behavior: Behavior normal.        Thought Content: Thought content normal.        Judgment: Judgment normal.     I have reviewed the Urgent Care notes and X-rays.  I have independently reviewed and interpreted x-rays of this patient done at another site by another physician or qualified health professional.       Assessment & Plan:   Encounter Diagnosis  Name Primary?   Closed nondisplaced fracture of fifth metatarsal bone of left foot, initial encounter Yes   Return in two weeks.  X-rays then.  I have reviewed the [place]  Controlled Substance Reporting System web site prior to prescribing narcotic medicine for this patient.  Call if any problem.  Precautions discussed.  Electronically Signed Lemond Stable, MD 8/6/20258:47 AM

## 2023-12-05 NOTE — Telephone Encounter (Signed)
 I called and sw pt and then called the pharmacist to let them know it is ok to ok and refill the script

## 2023-12-05 NOTE — Telephone Encounter (Signed)
 Dr. Janae pt - pt lvm stating she was seen today and that Dr. Brenna sent a medication in for her and that the pharmacy is stating it's too early to get it filled bc she got something from the urgent care on Monday.  She wants someone to call the pharmacy and tell them it's ok to fill it.  [phone]

## 2023-12-07 NOTE — Patient Instructions (Signed)

## 2023-12-10 ENCOUNTER — Ambulatory Visit (INDEPENDENT_AMBULATORY_CARE_PROVIDER_SITE_OTHER): Admitting: Nurse Practitioner

## 2023-12-10 ENCOUNTER — Encounter: Payer: Self-pay | Admitting: Nurse Practitioner

## 2023-12-10 VITALS — BP 126/82 | HR 94 | Ht 69.0 in

## 2023-12-10 DIAGNOSIS — E063 Autoimmune thyroiditis: Secondary | ICD-10-CM

## 2023-12-10 MED ORDER — LEVOTHYROXINE SODIUM 100 MCG PO TABS: 100.0000 ug | ORAL_TABLET | Freq: Every day | ORAL | 3 refills | Status: AC

## 2023-12-10 NOTE — Progress Notes (Signed)
 Endocrinology Follow Up Note                                         12/10/2023, 8:46 AM  Subjective:   Subjective    Julie [name] is a 53 y.o.-year-old female patient being seen in follow up after being seen in consultation for hypothyroidism referred by Marvine Rush, MD.   Past Medical History:  Diagnosis Date   Anxiety    Arthritis    Right hand ring finger   Bad odor of urine 09/09/2015   Complication of anesthesia    GERD (gastroesophageal reflux disease)    Headache    PONV (postoperative nausea and vomiting)     Past Surgical History:  Procedure Laterality Date   ABDOMINAL HYSTERECTOMY     BIOPSY  08/14/2017   Procedure: BIOPSY;  Surgeon: Harvey Margo LITTIE, MD;  [facility];  Service: Endoscopy;;  duodenum gastric   BLADDER SURGERY     CLOSED REDUCTION FINGER WITH PERCUTANEOUS PINNING Right    Ring finger   COLONOSCOPY WITH PROPOFOL  N/A 01/16/2017   Dr. harvey: Redundant colon, otherwise normal.  Next anoscopy in 10 years with MAC.   ESOPHAGOGASTRODUODENOSCOPY (EGD) WITH PROPOFOL  N/A 08/14/2017   Fields: Gastritis, no H. pylori.  Fundic gland polyps.  Small bowel biopsies negative.    Social History   Socioeconomic History   Marital status: Divorced    Spouse name: Not on file   Number of children: Not on file   Years of education: Not on file   Highest education level: Not on file  Occupational History   Not on file  Tobacco Use   Smoking status: Never   Smokeless tobacco: Never  Vaping Use   Vaping status: Never Used  Substance and Sexual Activity   Alcohol use: Yes    Comment: occassionally   Drug use: No   Sexual activity: Yes    Birth control/protection: Surgical    Comment: hyst  Other Topics Concern   Not on file  Social History Narrative   Not on file   Social Drivers of Health   Financial Resource Strain: Low Risk  (09/18/2023)   Overall Financial  Resource Strain (CARDIA)    Difficulty of Paying Living Expenses: Not hard at all  Food Insecurity: No Food Insecurity (09/18/2023)   Hunger Vital Sign    Worried About Running Out of Food in the Last Year: Never true    Ran Out of Food in the Last Year: Never true  Transportation Needs: No Transportation Needs (09/18/2023)   PRAPARE - Administrator, Civil Service (Medical): No    Lack of Transportation (Non-Medical): No  Physical Activity: Insufficiently Active (09/18/2023)   Exercise Vital Sign    Days of Exercise per Week: 2 days    Minutes of Exercise per Session: 30 min  Stress: No Stress Concern Present (09/18/2023)   [employer] of Occupational Health - Occupational Stress Questionnaire    Feeling of Stress :  Only a little  Social Connections: Socially Isolated (09/18/2023)   Social Connection and Isolation Panel    Frequency of Communication with Friends and Family: More than three times a week    Frequency of Social Gatherings with Friends and Family: Three times a week    Attends Religious Services: Never    Active Member of Clubs or Organizations: No    Attends Banker Meetings: Never    Marital Status: Divorced    Family History  Adopted: Yes  Problem Relation Age of Onset   Irritable bowel syndrome Son    Colon cancer Neg Hx    Gastric cancer Neg Hx    Esophageal cancer Neg Hx     Outpatient Encounter Medications as of 12/10/2023  Medication Sig   celecoxib  (CELEBREX ) 200 MG capsule Take 1 capsule (200 mg total) by mouth 2 (two) times daily.   EMGALITY 120 MG/ML SOAJ Inject 120 mg into the skin every 28 (twenty-eight) days.   EPINEPHrine 0.3 mg/0.3 mL IJ SOAJ injection Inject 0.3 mLs into the muscle once as needed.   hydrochlorothiazide (HYDRODIURIL) 25 MG tablet Take 25 mg by mouth daily.   HYDROcodone -acetaminophen  (NORCO/VICODIN) 5-325 MG tablet Take 1 tablet by mouth every 4 (four) hours as needed for up to 5 days for moderate pain  (pain score 4-6).   Multiple Vitamin (MULTIVITAMIN WITH MINERALS) TABS tablet Take 1 tablet by mouth daily.   nitrofurantoin , macrocrystal-monohydrate, (MACROBID ) 100 MG capsule Take 100 mg by mouth daily.   nortriptyline (PAMELOR) 10 MG capsule Take 40 mg by mouth at bedtime.   olmesartan-hydrochlorothiazide (BENICAR HCT) 40-25 MG tablet Take 1 tablet by mouth daily.   RABEprazole  (ACIPHEX ) 20 MG tablet Take 1 tablet (20 mg total) by mouth daily before breakfast.   sertraline (ZOLOFT) 50 MG tablet Take 50 mg by mouth daily.   [DISCONTINUED] levothyroxine  (SYNTHROID ) 100 MCG tablet Take 1 tablet (100 mcg total) by mouth daily before breakfast.   levothyroxine  (SYNTHROID ) 100 MCG tablet Take 1 tablet (100 mcg total) by mouth daily before breakfast.   No facility-administered encounter medications on file as of 12/10/2023.    ALLERGIES: Allergies  Allergen Reactions   Compazine  [Prochlorperazine ] Hives   Demerol [Meperidine] Nausea And Vomiting   Ibuprofen  Other (See Comments)    Stomach issues   VACCINATION STATUS: Immunization History  Administered Date(s) Administered   Tdap 10/05/2013, 08/03/2014     HPI   Julie [name]  is a patient with the above medical history. she was recently diagnosed with hypothyroidism at approximate age of 54 years, which required subsequent initiation of thyroid  hormone replacement. She is currently on Levothyroxine  100 micrograms. she reports compliance to this medication:  Taking it daily on empty stomach with water, separated by >30 minutes before breakfast and other medications , and by at least 4 hours from calcium, iron, PPIs, multivitamins .  I reviewed patient's thyroid  tests:  Lab Results  Component Value Date   TSH 0.799 12/03/2023   TSH 0.649 08/06/2023   TSH 2.320 02/22/2023   TSH 3.050 10/20/2022   TSH 1.420 08/17/2022   TSH 1.330 04/07/2022   TSH 1.070 12/12/2021   TSH 0.754 11/23/2021   TSH 2.420 10/04/2021   TSH 8.22 (A)  08/31/2021   FREET4 1.23 12/03/2023   FREET4 1.46 08/06/2023   FREET4 1.36 02/22/2023   FREET4 1.22 10/20/2022   FREET4 1.36 08/17/2022   FREET4 1.32 04/07/2022   FREET4 1.32 12/12/2021   FREET4 1.33 11/23/2021  FREET4 0.99 10/04/2021     Pt describes: - fatigue but attributes this to working 2 jobs - weight gain  Pt denies feeling nodules in neck, hoarseness, dysphagia/odynophagia, SOB with lying down.  she was adopted so she does not know her biological family's medical history.  No history of radiation therapy to head or neck.  No recent use of iodine supplements.  She does take a Biotin supplement and multivitamin.  I reviewed her chart and she also has a history of GERD.  Review of systems  Constitutional: +stable body weight- difficulty losing weight,  current Body mass index is 28.65 kg/m. , + fatigue-improving somewhat, no subjective hyperthermia, no subjective hypothermia, + chronic headaches (seeing neurology and is now on Botox injections which seem to be working) Eyes: no blurry vision, no xerophthalmia ENT: no sore throat, no nodules palpated in throat, no dysphagia/odynophagia, no hoarseness Cardiovascular: no chest pain, no shortness of breath, no palpitations, no leg swelling Respiratory: no cough, no shortness of breath Gastrointestinal: no nausea/vomiting/diarrhea Musculoskeletal: left foot in ortho boot- recent fracture Skin: no rashes, no hyperemia Neurological: no tremors, no numbness, no tingling, no dizziness Psychiatric: no depression, no anxiety   Objective:   Objective     BP 126/82 (BP Location: Left Arm, Patient Position: Sitting, Cuff Size: Large)   Pulse 94   Ht 5' 9 (1.753 m)   BMI 28.65 kg/m  Wt Readings from Last 3 Encounters:  12/05/23 194 lb (88 kg)  09/18/23 194 lb (88 kg)  08/08/23 193 lb (87.5 kg)    BP Readings from Last 3 Encounters:  12/10/23 126/82  12/05/23 135/85  12/03/23 127/83      Physical Exam-  Limited  Constitutional:  Body mass index is 28.65 kg/m. , not in acute distress, normal state of mind Eyes:  EOMI, no exophthalmos Musculoskeletal: left foot in ortho boot- recent fracture Skin:  no rashes, no hyperemia Neurological: no tremor with outstretched hands   CMP ( most recent) CMP     Component Value Date/Time   NA 141 09/21/2023 1134   K 4.6 09/21/2023 1134   CL 101 09/21/2023 1134   CO2 23 09/21/2023 1134   GLUCOSE 98 09/21/2023 1134   GLUCOSE 96 05/11/2021 1154   BUN 13 09/21/2023 1134   CREATININE 0.88 09/21/2023 1134   CREATININE 0.65 09/17/2013 0958   CALCIUM 9.7 09/21/2023 1134   PROT 6.8 09/21/2023 1134   ALBUMIN 4.8 09/21/2023 1134   AST 21 09/21/2023 1134   ALT 33 (H) 09/21/2023 1134   ALKPHOS 74 09/21/2023 1134   BILITOT 0.4 09/21/2023 1134   GFRNONAA >60 05/11/2021 1154   GFRAA 99 02/02/2020 1145     Diabetic Labs (most recent): Lab Results  Component Value Date   HGBA1C 5.9 (H) 09/09/2015     Lipid Panel ( most recent) Lipid Panel     Component Value Date/Time   CHOL 227 (H) 09/21/2023 1134   TRIG 130 09/21/2023 1134   HDL 52 09/21/2023 1134   CHOLHDL 4.4 09/21/2023 1134   CHOLHDL 4.1 09/17/2013 0958   VLDL 25 09/17/2013 0958   LDLCALC 152 (H) 09/21/2023 1134   LABVLDL 23 09/21/2023 1134       Lab Results  Component Value Date   TSH 0.799 12/03/2023   TSH 0.649 08/06/2023   TSH 2.320 02/22/2023   TSH 3.050 10/20/2022   TSH 1.420 08/17/2022   TSH 1.330 04/07/2022   TSH 1.070 12/12/2021   TSH 0.754 11/23/2021  TSH 2.420 10/04/2021   TSH 8.22 (A) 08/31/2021   FREET4 1.23 12/03/2023   FREET4 1.46 08/06/2023   FREET4 1.36 02/22/2023   FREET4 1.22 10/20/2022   FREET4 1.36 08/17/2022   FREET4 1.32 04/07/2022   FREET4 1.32 12/12/2021   FREET4 1.33 11/23/2021   FREET4 0.99 10/04/2021     Latest Reference Range & Units 10/20/22 08:07 02/22/23 08:27 08/06/23 10:58 12/03/23 09:34  TSH 0.450 - 4.500 uIU/mL 3.050 2.320 0.649  0.799  T4,Free(Direct) 0.82 - 1.77 ng/dL 8.77 8.63 8.53 8.76    Assessment & Plan:   ASSESSMENT / PLAN:  1. Hypothyroidism- d/t Hashimotos thyroiditis   Patient with new diagnosis of hypothyroidism, on levothyroxine  therapy (started on 5/5). On physical exam, patient does not have gross goiter, thyroid  nodules, or neck compression symptoms.  -Her previsit thyroid  function tests are consistent with appropriate hormone replacement.  She is advised to continue Levothyroxine  100 mcg po daily before breakfast.  Will recheck TFTs prior to next visit and adjust dose accordingly.  - We discussed about correct intake of levothyroxine , at fasting, with water, separated by at least 30 minutes from breakfast, and separated by more than 4 hours from calcium, iron, multivitamins, acid reflux medications (PPIs). -Patient is made aware of the fact that thyroid  hormone replacement is needed for life, dose to be adjusted by periodic monitoring of thyroid  function tests.  -Due to absence of clinical goiter, no need for thyroid  ultrasound.   We did go over WFPB lifestyle changes that can help her with weight loss, fatigue and overall health. The following Lifestyle Medicine recommendations according to American College of Lifestyle Medicine [facility]) were discussed and offered to patient and she agrees to start the journey:  A. Whole Foods, Plant-based plate comprising of fruits and vegetables, plant-based proteins, whole-grain carbohydrates was discussed in detail with the patient.   A list for source of those nutrients were also provided to the patient.  Patient will use only water or unsweetened tea for hydration. B.  The need to stay away from risky substances including alcohol, smoking; obtaining 7 to 9 hours of restorative sleep, at least 150 minutes of moderate intensity exercise weekly, the importance of healthy social connections,  and stress reduction techniques were discussed. C.  A full color page of   Calorie density of various food groups per pound showing examples of each food groups was provided to the patient.  We did talk about the potential to try weight loss medications in the future.  However with her BMI being less than 35, we may have trouble getting this approved.     I spent  18  minutes in the care of the patient today including review of labs from Thyroid  Function, CMP, and other relevant labs ; imaging/biopsy records (current and previous including abstractions from other facilities); face-to-face time discussing  her lab results and symptoms, medications doses, her options of short and long term treatment based on the latest standards of care / guidelines;   and documenting the encounter.  Julie [name]  participated in the discussions, expressed understanding, and voiced agreement with the above plans.  All questions were answered to her satisfaction. she is encouraged to contact clinic should she have any questions or concerns prior to her return visit.   FOLLOW UP PLAN:  Return in about 6 months (around 06/11/2024) for Thyroid  follow up, Previsit labs.  Benton Rio, [facility] [address] [place], [place] [address] Phone: [phone] Fax: [phone]  12/10/2023, 8:46 AM

## 2023-12-19 ENCOUNTER — Ambulatory Visit (INDEPENDENT_AMBULATORY_CARE_PROVIDER_SITE_OTHER): Admitting: Orthopaedic Surgery

## 2023-12-19 ENCOUNTER — Encounter: Payer: Self-pay | Admitting: Orthopaedic Surgery

## 2023-12-19 ENCOUNTER — Other Ambulatory Visit (INDEPENDENT_AMBULATORY_CARE_PROVIDER_SITE_OTHER)

## 2023-12-19 VITALS — Ht 69.0 in | Wt 193.0 lb

## 2023-12-19 DIAGNOSIS — S92355A Nondisplaced fracture of fifth metatarsal bone, left foot, initial encounter for closed fracture: Secondary | ICD-10-CM

## 2023-12-19 NOTE — Progress Notes (Signed)
 I am not as sore.  She went to the beach with her friends and had a good time.  She has some swelling of the left foot more towards the afternoon.  She is working.  She is using the CAM walker.  NV intact.  Gait good with the CAM walker.  X-rays were done of the left foot, reported separately.   Encounter Diagnosis  Name Primary?   Closed nondisplaced fracture of fifth metatarsal bone of left foot, subsequent encounter Yes   Return in three weeks.  X-rays of left foot then.  Continue CAM walker.  Call if any problem.  Precautions discussed.  Electronically Signed Lemond Stable, MD 8/20/20259:13 AM

## 2023-12-21 ENCOUNTER — Encounter: Payer: Self-pay | Admitting: Radiology

## 2023-12-25 ENCOUNTER — Other Ambulatory Visit: Payer: Self-pay | Admitting: Gastroenterology

## 2024-01-09 ENCOUNTER — Ambulatory Visit (INDEPENDENT_AMBULATORY_CARE_PROVIDER_SITE_OTHER): Admitting: Orthopaedic Surgery

## 2024-01-09 ENCOUNTER — Other Ambulatory Visit (INDEPENDENT_AMBULATORY_CARE_PROVIDER_SITE_OTHER)

## 2024-01-09 ENCOUNTER — Encounter: Payer: Self-pay | Admitting: Orthopaedic Surgery

## 2024-01-09 DIAGNOSIS — S92355A Nondisplaced fracture of fifth metatarsal bone, left foot, initial encounter for closed fracture: Secondary | ICD-10-CM | POA: Diagnosis not present

## 2024-01-09 DIAGNOSIS — S92355D Nondisplaced fracture of fifth metatarsal bone, left foot, subsequent encounter for fracture with routine healing: Secondary | ICD-10-CM

## 2024-01-09 NOTE — Progress Notes (Signed)
 I am better.  She has less pain of the left foot and has been using the CAM walker.  NV intact.  ROM is good.  X-rays were done of the left foot, reported separately.  Encounter Diagnosis  Name Primary?   Closed nondisplaced fracture of fifth metatarsal bone of left foot with routine healing, subsequent encounter Yes   Return in three weeks.  X-rays then.  Call if any problem.  Precautions discussed.  Electronically Signed Lemond Stable, MD 9/10/20252:57 PM

## 2024-01-30 ENCOUNTER — Encounter: Payer: Self-pay | Admitting: Orthopaedic Surgery

## 2024-01-30 ENCOUNTER — Ambulatory Visit (INDEPENDENT_AMBULATORY_CARE_PROVIDER_SITE_OTHER): Admitting: Orthopaedic Surgery

## 2024-01-30 ENCOUNTER — Other Ambulatory Visit (INDEPENDENT_AMBULATORY_CARE_PROVIDER_SITE_OTHER): Payer: Self-pay

## 2024-01-30 DIAGNOSIS — S92355D Nondisplaced fracture of fifth metatarsal bone, left foot, subsequent encounter for fracture with routine healing: Secondary | ICD-10-CM | POA: Diagnosis not present

## 2024-01-30 NOTE — Progress Notes (Signed)
 My foot is better.  She has only occasional discomfort of the dorsum of the lateral left foot.  She is walking well in flip flops today.    NV intact.  ROM of toes and ankle normal.  No redness,.  X-rays were done of the left foot, reported separately.  Encounter Diagnosis  Name Primary?   Closed nondisplaced fracture of fifth metatarsal bone of left foot with routine healing, subsequent encounter Yes   Return prn.  I have informed the patient I will be retiring from medical practice and from this office on January 31, 2024.  The patient has been offered continuing care with Dr. Margrette or Dr. Onesimo of this office.  The patient may choose another provider and the records will be forwarded after proper signature and notification.  Patient understands and agrees.  Call if any problem.  Precautions discussed.  Electronically Signed Lemond Stable, MD 10/1/20252:41 PM

## 2024-03-03 ENCOUNTER — Encounter: Payer: Self-pay | Admitting: Radiology

## 2024-04-09 ENCOUNTER — Telehealth: Payer: Self-pay | Admitting: *Deleted

## 2024-04-09 NOTE — Telephone Encounter (Signed)
 Patient called and ask if she could get her lab work done now. She reports that she is so tired to the point of exhaustion, also feels sore.  Reviewed with Benton and she advises that the patient may get lab work and she will call her with those results.  Patient was made aware. She will get done 04/10/2024.

## 2024-04-11 ENCOUNTER — Ambulatory Visit: Payer: Self-pay | Admitting: Nurse Practitioner

## 2024-04-11 LAB — T4, FREE: Free T4: 1.54 ng/dL (ref 0.82–1.77)

## 2024-04-11 LAB — TSH: TSH: 1.64 u[IU]/mL (ref 0.450–4.500)

## 2024-06-06 ENCOUNTER — Other Ambulatory Visit: Payer: Self-pay | Admitting: Nurse Practitioner

## 2024-06-06 ENCOUNTER — Telehealth: Payer: Self-pay | Admitting: Nurse Practitioner

## 2024-06-06 DIAGNOSIS — E063 Autoimmune thyroiditis: Secondary | ICD-10-CM

## 2024-06-06 NOTE — Telephone Encounter (Signed)
 Pt said she is doing labs Monday, do you want to put a new order in for her thyroid  labs since her last ones were done in Dec ?

## 2024-06-06 NOTE — Telephone Encounter (Signed)
 Done

## 2024-06-06 NOTE — Telephone Encounter (Signed)
 I just put in her thyroid  labs

## 2024-06-06 NOTE — Telephone Encounter (Signed)
 Pt is wanting to know if you can add a cortisol lab to her lab order to have done?

## 2024-06-06 NOTE — Telephone Encounter (Signed)
 That would be something we need to discuss first.  I have no clinical indication of her needing a test like that and insurance may refuse to pay for the test.

## 2024-06-11 ENCOUNTER — Ambulatory Visit: Admitting: Nurse Practitioner
